# Patient Record
Sex: Female | Born: 1955
Health system: Southern US, Community
[De-identification: ages and names within clinical notes are randomized; demographics above are authoritative.]

## PROBLEM LIST (undated history)

## (undated) DIAGNOSIS — Z803 Family history of malignant neoplasm of breast: Secondary | ICD-10-CM

## (undated) DIAGNOSIS — E119 Type 2 diabetes mellitus without complications: Secondary | ICD-10-CM

## (undated) DIAGNOSIS — F32A Depression, unspecified: Secondary | ICD-10-CM

## (undated) DIAGNOSIS — Z8042 Family history of malignant neoplasm of prostate: Secondary | ICD-10-CM

## (undated) DIAGNOSIS — N3281 Overactive bladder: Secondary | ICD-10-CM

## (undated) HISTORY — DX: Family history of malignant neoplasm of prostate: Z80.42

## (undated) HISTORY — DX: Type 2 diabetes mellitus without complications: E11.9

## (undated) HISTORY — DX: Overactive bladder: N32.81

## (undated) HISTORY — DX: Depression, unspecified: F32.A

## (undated) HISTORY — PX: CHOLECYSTECTOMY: SHX55

## (undated) HISTORY — DX: Family history of malignant neoplasm of breast: Z80.3

---

## 2020-10-28 NOTE — Progress Notes (Signed)
Spoke to patient's daughter Minna Merritts regarding referral we received from Dr. Doyle Askew at [facility].  She states the patient will be staying in this area.  I have scheduled her to be seen by Dr. Burr Medico on 11/03/2020 at 3 pm to arrive no later than 2:45.  She was given our location.  She is aware she can only have one person to accompany her to this consult.

## 2020-11-02 NOTE — Progress Notes (Signed)
[place]   [phone] [phone] [place] Note   Patient Care Team: System, Provider Not In as PCP - General [name], MD (Psychiatry) Laretta Bolster, NP as Nurse Practitioner (Internal Medicine) Truitt Merle, MD as Consulting Physician (Oncology) Jonnie Finner, RN as Oncology Nurse Navigator  Date of Service:  11/03/2020  CHIEF COMPLAINTS/PURPOSE OF CONSULTATION:  Newly diagnosed Gall bladder cancer   REFERRING PHYSICIAN:  Dr. Olen Pel at [facility]   Oncology History  Primary gall bladder adenocarcinoma [facility])  09/20/2020 Cancer Staging   Staging form: Gallbladder, AJCC 8th Edition - Pathologic stage from 09/20/2020: Stage IIIB (pT3, pN1, cM0) - Signed by Truitt Merle, MD on 11/03/2020   11/03/2020 Initial Diagnosis   Primary gall bladder adenocarcinoma ([place])      HISTORY OF PRESENTING ILLNESS:  Pamela Kennedy 65 y.o. female is a here because of newly diagnosed gall bladder cancer. The patient was referred by Dr. Olen Pel at [facility]. The patient presents to the clinic today accompanied by her daughter Pamela Kennedy.   Pt initially presented to local ED for nausea, vomiting, abdominal pain and lethargy on 11/04/2019.  She was admitted to [facility] in [place], [place].  She was diagnosed with gallbladder stones and acute cholecystitis.  She was treated with antibiotics, and was taken to the OR for cholecystectomy.  Surgical path showed invasive adenocarcinoma. Staging CT was negative for distant metastasis. She was referred to [facility] surgeon Dr. Olen Pel for further management.  MRI of abdomen at [facility] on 08/17/2020 showed marked loculated collection within the gallbladder foci with subtle peripheral enhancement, prominent portacaval nodes terminate.  She was admitted to [facility] on September 20, 2020 for surgery. She underwent open segment IVb/V hepatectomy and complete total lymphadenectomy.  Surgical path showed residual adenocarcinoma in liver, and 2  out of 9 positive lymph nodes.  Surgical margins were negative.  Patient developed acute mental status change after surgery, work-up was done, and was felt that she had a small stroke.  She was discharged from [facility] on October 12, 2020, to rehab.  She did well in rehab, and was discharged on October 21, 2020.  She currently lives with her sister in [place], her daughter works in [employer].  Per her daughter Pamela Kennedy, she usually does not do well with anesthesia, with prolonged recovery. She is still slow, does not talk the way she used to be, HR high, no plapitation or chest pain , no pain, energy level is about 50% of her baseline.  She is able to do all her ADLs, does light housework, such as Medical sales representative.  Appetite still low, eats moderately well, has gained some weight back.   She fell out of bed this morning when she was reaching for something on nightstand, given mild bruises on her knees. She is able to walk without difficulty.   She has past medical history of severe depression with psychosis, she is under psychiatrist Dr. Olen Pel care.   REVIEW OF SYSTEMS:    Constitutional: Denies fevers, chills or abnormal night sweats, (+) fatigue and weight loss  Eyes: Denies blurriness of vision, double vision or watery eyes Ears, nose, mouth, throat, and face: Denies mucositis or sore throat Respiratory: Denies cough, dyspnea or wheezes Cardiovascular: Denies palpitation, chest discomfort or lower extremity swelling Gastrointestinal:  Denies nausea, heartburn or change in bowel habits, (+) low appetite  Skin: Denies abnormal skin rashes Lymphatics: Denies new lymphadenopathy or easy bruising Neurological:Denies numbness, tingling or new weaknesses Behavioral/Psych: Mood is  stable, no new changes All other systems were reviewed with the patient and are negative.   MEDICAL HISTORY:  Past Medical History:  Diagnosis Date  . Depression   . Diabetes mellitus without complication ([place])   . Overactive  bladder     SURGICAL HISTORY: Past Surgical History:  Procedure Laterality Date  . CHOLECYSTECTOMY      SOCIAL HISTORY: Social History   Socioeconomic History  . Marital status: Divorced    Spouse name: Not on file  . Number of children: 2  . Years of education: Not on file  . Highest education level: Not on file  Occupational History  . Occupation: retired, on disability since age of 64   Tobacco Use  . Smoking status: Current Some Day Smoker    Years: 60.00  . Smokeless tobacco: Current User    Types: Snuff  Substance and Sexual Activity  . Alcohol use: Never  . Drug use: Not on file  . Sexual activity: Not on file  Other Topics Concern  . Not on file  Social History Narrative  . Not on file   Social Determinants of Health   Financial Resource Strain: Not on file  Food Insecurity: Not on file  Transportation Needs: Not on file  Physical Activity: Not on file  Stress: Not on file  Social Connections: Not on file  Intimate Partner Violence: Not on file    FAMILY HISTORY: Family History  Problem Relation Age of Onset  . Cancer Father 60       prostate cancer   . Cancer Sister        breast cancer  . Cancer Brother 57       prostate cancer  . Cancer Sister 33       breast cancer    ALLERGIES:  has No Known Allergies.  MEDICATIONS:  Current Outpatient Medications  Medication Sig Dispense Refill  . aspirin EC 81 MG tablet Take 81 mg by mouth daily. Swallow whole.    . benztropine (COGENTIN) 0.5 MG tablet Take 0.5 mg by mouth 2 (two) times daily.    . fesoterodine (TOVIAZ) 8 MG TB24 tablet Take 8 mg by mouth daily.    Marland Kitchen lovastatin (ALTOPREV) 20 MG 24 hr tablet Take 20 mg by mouth at bedtime.    . metFORMIN (GLUCOPHAGE) 500 MG tablet Take by mouth 2 (two) times daily with a meal.    . raloxifene (EVISTA) 60 MG tablet Take 60 mg by mouth daily.    . risperidone (RISPERDAL) 4 MG tablet Take 4 mg by mouth daily. At bedtime    . venlafaxine (EFFEXOR) 75 MG  tablet Take 75 mg by mouth daily.    Marland Kitchen venlafaxine XR (EFFEXOR-XR) 150 MG 24 hr capsule Take 150 mg by mouth daily with breakfast.     No current facility-administered medications for this visit.    PHYSICAL EXAMINATION: ECOG PERFORMANCE STATUS: 2 - Symptomatic, <50% confined to bed  Vitals:   11/03/20 1449  BP: 117/79  Pulse: (!) 120  Resp: 15  Temp: 98.1 F (36.7 C)  SpO2: 100%   Filed Weights   11/03/20 1449  Weight: 193 lb 1.6 oz (87.6 kg)    GENERAL:alert, no distress and comfortable SKIN: skin color, texture, turgor are normal, no rashes or significant lesions EYES: normal, Conjunctiva are pink and non-injected, sclera clear NECK: supple, thyroid normal size, non-tender, without nodularity LYMPH:  no palpable lymphadenopathy in the cervical, axillary  LUNGS: clear to auscultation and percussion  with normal breathing effort HEART: regular rate & rhythm and no murmurs and no lower extremity edema ABDOMEN:abdomen soft, non-tender and normal bowel sounds, (+) surgical incision in RUQ healed very well.  Musculoskeletal:no cyanosis of digits and no clubbing  NEURO: alert & oriented x 3 with fluent speech, no focal motor/sensory deficits  LABORATORY DATA:  I have reviewed the data as listed  Surgical path at [facility] 09/20/20 Final Diagnosis   A: Lymph nodes, common and proper hepatic, lymphadenectomy: -One of seven lymph nodes, positive for adenocarcinoma. (1/7) -The lymph nodes show nonnecrotizing granulomatous reaction.  B: Lymph nodes, hepatic duodenal, lymphadenectomy: -One of two lymph nodes, positive for adenocarcinoma. (1/2)  C: Cystic duct, biopsy: -Bile duct, negative for carcinoma.  D: Liver, partial hepatectomy (gallbladder fossa): -Adenocarcinoma, poorly-differentiated, 1.0 cm in size,  infiltrating liver. -The cauterized liver parenchymal resection margin is negative for carcinoma.  (See comment)   CT Chest 08/25/20 at [facility] IMPRESSION:  No evidence  of intrathoracic metastatic disease.   Focal nodular asymmetries in the left breast requiring follow-up to exclude neoplastic etiology.   Recent cholecystectomy with slight inflammatory stranding at the gallbladder fossa.   Probable thyroid goiter with mild enlargement and hypodense lesions.   RADIOGRAPHIC STUDIES: I have personally reviewed the radiological images as listed and agreed with the findings in the report. No results found.  ASSESSMENT & PLAN:  Pamela Kennedy is a 65 y.o. female with a history of depression and DM  1. Gallbladder cancer cancer, pT3N1M0, stage IIIB  -I have reviewed her outside pathology initial cholecystectomy, and second surgery surgery at [facility].  She had stage IIIb gallbladder cancer, which was removed completely by 2 surgeries, with negative margins. -I reviewed the natural history of gallbladder, high risk of recurrence after surgery especially given her locally advanced disease. -Based on NCCN guideline, I recommend adjuvant Xeloda 1000mg /m2 bid day 1-14 every 21 days for 6 months --Chemotherapy consent: Side effects including but does not not limited to, fatigue, nausea, vomiting, diarrhea, hair loss, neuropathy, fluid retention, renal and kidney dysfunction, neutropenic fever, needed for blood transfusion, bleeding, were discussed with patient in great detail. She agrees to proceed. -the goal of therapy is curative  -She is recovering slowly from surgery, plan to start in about 2 weeks -We discussed cancer surveillance due to the high risk, Plan to surveillance CT scan every 6 months for the first few years then every year for a total of 3-4 years, with routine lab, exam 3 to 6 months for total of 5 years -All patient's questions answered she agrees to proceed.  2. Depression  -continue her meds and f/u with her psychiatrist   3. Low appetite and weight loss -will refer her to dietician   4. Genetics -Due to her strong family of breast cancer  and prostate cancer, I will refer her to genetics   PLAN:  -I will call in Xeloda 2000mg  Bid for day 1-14 every 21 days to [facility], plan to start on 2/7 -lab and f/u the week of 2/21  -dietician consult  -genetic referral  -lab next week   Orders Placed This Encounter  Procedures  . CBC with Differential (Cancer Center Only)    Standing Status:   Standing    Number of Occurrences:   20    Standing Expiration Date:   11/03/2021  . CMP ([place] only)    Standing Status:   Standing    Number of Occurrences:   20    Standing  Expiration Date:   11/03/2021  . CA 19.9    Standing Status:   Standing    Number of Occurrences:   10    Standing Expiration Date:   11/03/2021  . Ambulatory referral to Genetics    Referral Priority:   Routine    Referral Type:   Consultation    Referral Reason:   Specialty Services Required    Number of Visits Requested:   1  . Amb Referral to Nutrition and Diabetic Education    Referral Priority:   Routine    Referral Type:   Consultation    Referral Reason:   Specialty Services Required    Number of Visits Requested:   1    All questions were answered. The patient knows to call the clinic with any problems, questions or concerns. The total time spent in the appointment was 60 minutes.     Truitt Merle, MD 11/04/2020 11:09 AM  I, Joslyn Devon, am acting as scribe for Truitt Merle, MD.   I have reviewed the above documentation for accuracy and completeness, and I agree with the above.

## 2020-11-02 NOTE — Progress Notes (Signed)
Spoke with [facility] in [place], [place] and they do not have the capability of pushing images over to [employer].

## 2020-11-03 ENCOUNTER — Encounter: Payer: Self-pay | Admitting: Hematology

## 2020-11-03 ENCOUNTER — Inpatient Hospital Stay: Payer: Medicare Other | Attending: Hematology | Admitting: Hematology

## 2020-11-03 ENCOUNTER — Other Ambulatory Visit: Payer: Self-pay

## 2020-11-03 DIAGNOSIS — E119 Type 2 diabetes mellitus without complications: Secondary | ICD-10-CM | POA: Diagnosis not present

## 2020-11-03 DIAGNOSIS — Z7984 Long term (current) use of oral hypoglycemic drugs: Secondary | ICD-10-CM | POA: Insufficient documentation

## 2020-11-03 DIAGNOSIS — R630 Anorexia: Secondary | ICD-10-CM | POA: Insufficient documentation

## 2020-11-03 DIAGNOSIS — Z803 Family history of malignant neoplasm of breast: Secondary | ICD-10-CM | POA: Diagnosis not present

## 2020-11-03 DIAGNOSIS — F323 Major depressive disorder, single episode, severe with psychotic features: Secondary | ICD-10-CM | POA: Insufficient documentation

## 2020-11-03 DIAGNOSIS — Z8042 Family history of malignant neoplasm of prostate: Secondary | ICD-10-CM | POA: Insufficient documentation

## 2020-11-03 DIAGNOSIS — C23 Malignant neoplasm of gallbladder: Secondary | ICD-10-CM | POA: Diagnosis present

## 2020-11-03 DIAGNOSIS — R634 Abnormal weight loss: Secondary | ICD-10-CM | POA: Insufficient documentation

## 2020-11-03 DIAGNOSIS — Z7982 Long term (current) use of aspirin: Secondary | ICD-10-CM | POA: Diagnosis not present

## 2020-11-03 DIAGNOSIS — F17210 Nicotine dependence, cigarettes, uncomplicated: Secondary | ICD-10-CM

## 2020-11-03 DIAGNOSIS — Z79899 Other long term (current) drug therapy: Secondary | ICD-10-CM | POA: Insufficient documentation

## 2020-11-04 ENCOUNTER — Other Ambulatory Visit: Payer: Self-pay | Admitting: Hematology

## 2020-11-04 ENCOUNTER — Telehealth: Payer: Self-pay | Admitting: Hematology

## 2020-11-04 ENCOUNTER — Telehealth: Payer: Self-pay | Admitting: Pharmacist

## 2020-11-04 ENCOUNTER — Telehealth: Payer: Self-pay

## 2020-11-04 DIAGNOSIS — C23 Malignant neoplasm of gallbladder: Secondary | ICD-10-CM

## 2020-11-04 MED ORDER — CAPECITABINE 500 MG PO TABS: 1000.0000 mg/m2 | ORAL_TABLET | Freq: Two times a day (BID) | ORAL | 0 refills | Status: DC

## 2020-11-04 NOTE — Telephone Encounter (Signed)
Scheduled appointments per 1/27 los. Spoke to patient's daughter, Minna Merritts, who is aware of appointments dates and times.

## 2020-11-04 NOTE — Telephone Encounter (Signed)
Oral Oncology Patient Advocate Encounter  After completing a benefits investigation, prior authorization for Xeloda is not required at this time through Medicare B.  Patient's copay is $23.68  [facility] Phone [phone] Fax [phone] 11/04/2020 12:23 PM

## 2020-11-04 NOTE — Telephone Encounter (Signed)
Oral Oncology Pharmacist Encounter  Received new prescription for Xeloda (capecitabine) for the adjuvant treatment of gallbladder cancer, planned duration 6 months.  Prescription dose and frequency assessed for appropriateness. Planned therapy initiation 11/14/20.  Outside labs from 10/17/20 assessed, no dose adjustments required. Patient has baseline labs scheduled for 11/08/20 - will assess labs at that time.   Current medication list in Epic reviewed, no relevant/significant DDIs with Xeloda identified.  Evaluated chart and no patient barriers to medication adherence noted.   Prescription has been e-scribed to the [facility] for benefits analysis and approval.  Oral Oncology Clinic will continue to follow for insurance authorization, copayment issues, initial counseling and start date.  Leron Croak, PharmD, BCPS Hematology/Oncology Clinical Pharmacist [facility] [phone] 11/04/2020 12:32 PM

## 2020-11-08 ENCOUNTER — Inpatient Hospital Stay: Payer: Medicare Other

## 2020-11-08 ENCOUNTER — Other Ambulatory Visit: Payer: Self-pay | Admitting: Genetic Counselor

## 2020-11-08 ENCOUNTER — Encounter: Payer: Self-pay | Admitting: Genetic Counselor

## 2020-11-08 ENCOUNTER — Other Ambulatory Visit: Payer: Self-pay

## 2020-11-08 ENCOUNTER — Inpatient Hospital Stay: Payer: Medicare Other | Admitting: Nutrition

## 2020-11-08 ENCOUNTER — Inpatient Hospital Stay: Payer: Medicare Other | Attending: Hematology | Admitting: Genetic Counselor

## 2020-11-08 DIAGNOSIS — C23 Malignant neoplasm of gallbladder: Secondary | ICD-10-CM

## 2020-11-08 DIAGNOSIS — Z803 Family history of malignant neoplasm of breast: Secondary | ICD-10-CM | POA: Insufficient documentation

## 2020-11-08 DIAGNOSIS — Z8042 Family history of malignant neoplasm of prostate: Secondary | ICD-10-CM | POA: Insufficient documentation

## 2020-11-08 DIAGNOSIS — E119 Type 2 diabetes mellitus without complications: Secondary | ICD-10-CM | POA: Diagnosis not present

## 2020-11-08 DIAGNOSIS — Z79899 Other long term (current) drug therapy: Secondary | ICD-10-CM | POA: Insufficient documentation

## 2020-11-08 DIAGNOSIS — Z9049 Acquired absence of other specified parts of digestive tract: Secondary | ICD-10-CM | POA: Insufficient documentation

## 2020-11-08 DIAGNOSIS — D649 Anemia, unspecified: Secondary | ICD-10-CM | POA: Insufficient documentation

## 2020-11-08 DIAGNOSIS — F32A Depression, unspecified: Secondary | ICD-10-CM | POA: Diagnosis not present

## 2020-11-08 DIAGNOSIS — K5900 Constipation, unspecified: Secondary | ICD-10-CM | POA: Insufficient documentation

## 2020-11-08 DIAGNOSIS — Z7984 Long term (current) use of oral hypoglycemic drugs: Secondary | ICD-10-CM | POA: Insufficient documentation

## 2020-11-08 DIAGNOSIS — Z7982 Long term (current) use of aspirin: Secondary | ICD-10-CM | POA: Diagnosis not present

## 2020-11-08 LAB — CMP (CANCER CENTER ONLY)
ALT: 23 U/L (ref 0–44)
AST: 19 U/L (ref 15–41)
Albumin: 3.6 g/dL (ref 3.5–5.0)
Alkaline Phosphatase: 115 U/L (ref 38–126)
Anion gap: 11 (ref 5–15)
BUN: 11 mg/dL (ref 8–23)
CO2: 25 mmol/L (ref 22–32)
Calcium: 9.3 mg/dL (ref 8.9–10.3)
Chloride: 108 mmol/L (ref 98–111)
Creatinine: 0.84 mg/dL (ref 0.44–1.00)
GFR, Estimated: >60 mL/min (ref >60)
Glucose, Bld: 148 mg/dL — ABNORMAL HIGH (ref 70–99)
Potassium: 4.3 mmol/L (ref 3.5–5.1)
Sodium: 144 mmol/L (ref 135–145)
Total Bilirubin: 0.4 mg/dL (ref 0.3–1.2)
Total Protein: 7.5 g/dL (ref 6.5–8.1)

## 2020-11-08 LAB — CBC WITH DIFFERENTIAL (CANCER CENTER ONLY)
Abs Immature Granulocytes: 0.01 10*3/uL (ref 0.00–0.07)
Basophils Absolute: 0.1 10*3/uL (ref 0.0–0.1)
Basophils Relative: 1 %
Eosinophils Absolute: 0.1 10*3/uL (ref 0.0–0.5)
Eosinophils Relative: 2 %
HCT: 33.2 % — ABNORMAL LOW (ref 36.0–46.0)
Hemoglobin: 9.6 g/dL — ABNORMAL LOW (ref 12.0–15.0)
Immature Granulocytes: 0 %
Lymphocytes Relative: 21 %
Lymphs Abs: 1.5 10*3/uL (ref 0.7–4.0)
MCH: 19.5 pg — ABNORMAL LOW (ref 26.0–34.0)
MCHC: 28.9 g/dL — ABNORMAL LOW (ref 30.0–36.0)
MCV: 67.5 fL — ABNORMAL LOW (ref 80.0–100.0)
Monocytes Absolute: 0.4 10*3/uL (ref 0.1–1.0)
Monocytes Relative: 6 %
Neutro Abs: 4.9 10*3/uL (ref 1.7–7.7)
Neutrophils Relative %: 70 %
Platelet Count: 428 10*3/uL — ABNORMAL HIGH (ref 150–400)
RBC: 4.92 MIL/uL (ref 3.87–5.11)
RDW: 16 % — ABNORMAL HIGH (ref 11.5–15.5)
WBC Count: 7 10*3/uL (ref 4.0–10.5)
nRBC: 0 % (ref 0.0–0.2)

## 2020-11-08 LAB — GENETIC SCREENING ORDER

## 2020-11-08 MED FILL — CAPECITABINE 500 MG TABLET: 500 | 21 days supply | Qty: 112 | Fill #0

## 2020-11-08 NOTE — Progress Notes (Signed)
REFERRING PROVIDER: Truitt Merle, MD [place],  [place] [address]  PRIMARY PROVIDER:  System, Provider Not In  PRIMARY REASON FOR VISIT:  1. Primary gall bladder adenocarcinoma ([place])   2. Family history of breast cancer   3. Family history of prostate cancer      HISTORY OF PRESENT ILLNESS:   Pamela Kennedy, a 65 y.o. female, was seen for a  cancer genetics consultation at the request of Dr. Burr Medico due to a personal and family history of cancer.  Pamela Kennedy presents to clinic today to discuss the possibility of a hereditary predisposition to cancer, genetic testing, and to further clarify her future cancer risks, as well as potential cancer risks for family members.   In 2021, at the age of 39, Pamela Kennedy was diagnosed with adenocarcinoma of the gallbladder. The treatment plan includes chemotherapy.    CANCER HISTORY:  Oncology History Overview Note  Cancer Staging Primary gall bladder adenocarcinoma ([place]) Staging form: Gallbladder, AJCC 8th Edition - Pathologic stage from 09/20/2020: Stage IIIB (pT3, pN1, cM0) - Signed by Truitt Merle, MD on 11/03/2020 Histologic grade (G): G3 Histologic grading system: 3 grade system Residual tumor (R): R0 - None Histologic sub-type: Adenocarcinoma    Primary gall bladder adenocarcinoma ([place])  07/05/2020 Initial Biopsy   Cholecystectomy by Dr Moreen Fowler   Final Diagnosis A. Gallbladder, Cholecystectomy:  -Invasive adenocarcinoma, moderately to poorly-differentiated, infiltrating into the perimuscular fibroconnective tissue.  -Multifocal perineural invasion identified.  -The cystic duct margin is negative for carcinoma.  -Gallbladder with acute necrotizing cholecystitis and cholelithiasis.     08/17/2020 Imaging   MRI Abdomen at [facility]  IMPRESSION -Limited evaluation secondary to motion and use of motion insensitive sequences with suboptimal arterial phase timing.   -Multiloculated collection within the gallbladder  fossa with subtle peripheral enhancement may represent a postoperative seroma versus biloma. An abscess is thought to be unlikely.   -Prominent portacaval nodes, indeterminate and possibly reactive.   -Asymmetric left breast tissue likely fibroglandular tissue. Recommend  correlation with mammogram     08/25/2020 Imaging   CT Chest 08/25/20 at [facility] IMPRESSION:   No evidence of intrathoracic metastatic disease.   Focal nodular asymmetries in the left breast requiring follow-up to exclude neoplastic etiology.   Recent cholecystectomy with slight inflammatory stranding at the gallbladder fossa.   Probable thyroid goiter with mild enlargement and hypodense lesions.   09/20/2020 Cancer Staging   Staging form: Gallbladder, AJCC 8th Edition - Pathologic stage from 09/20/2020: Stage IIIB (pT3, pN1, cM0) - Signed by Truitt Merle, MD on 11/03/2020   09/20/2020 Surgery   Surgery of liver and abdomen by Dr Olen Pel at [facility]    09/20/2020 Pathology Results    Final Diagnosis   A: Lymph nodes, common and proper hepatic, lymphadenectomy: -One of seven lymph nodes, positive for adenocarcinoma. (1/7) -The lymph nodes show nonnecrotizing granulomatous reaction.   B: Lymph nodes, hepatic duodenal, lymphadenectomy: -One of two lymph nodes, positive for adenocarcinoma. (1/2)   C: Cystic duct, biopsy: -Bile duct, negative for carcinoma.   D: Liver, partial hepatectomy (gallbladder fossa): -Adenocarcinoma, poorly-differentiated, 1.0 cm in size,  infiltrating liver. -The cauterized liver parenchymal resection margin is negative for carcinoma.  (See comment)   11/03/2020 Initial Diagnosis   Primary gall bladder adenocarcinoma ([facility])     RISK FACTORS:  Menarche was at age unknown.  First live birth at age 89.  OCP use: yes, unknown number of years. Ovaries intact: yes.  Hysterectomy: no.  Menopausal status: postmenopausal.  HRT use: 0 years. Colonoscopy: yes; history of polyps. Mammogram within  the last year: yes. Any excessive radiation exposure in the past: no   Past Medical History:  Diagnosis Date  . Depression   . Diabetes mellitus without complication ([place])   . Family history of breast cancer   . Family history of prostate cancer   . Overactive bladder     Past Surgical History:  Procedure Laterality Date  . CHOLECYSTECTOMY      Social History   Socioeconomic History  . Marital status: Divorced    Spouse name: Not on file  . Number of children: 2  . Years of education: Not on file  . Highest education level: Not on file  Occupational History  . Occupation: retired, on disability since age of 64   Tobacco Use  . Smoking status: Current Some Day Smoker    Years: 60.00  . Smokeless tobacco: Current User    Types: Snuff  Substance and Sexual Activity  . Alcohol use: Never  . Drug use: Not on file  . Sexual activity: Not on file  Other Topics Concern  . Not on file  Social History Narrative  . Not on file   Social Determinants of Health   Financial Resource Strain: Not on file  Food Insecurity: Not on file  Transportation Needs: Not on file  Physical Activity: Not on file  Stress: Not on file  Social Connections: Not on file     FAMILY HISTORY:  We obtained a detailed, 4-generation family history.  Significant diagnoses are listed below: Family History  Problem Relation Age of Onset  . Prostate cancer Father 38       prostate cancer, metastatic  . Breast cancer Sister        breast cancer, dx unknown age  . Prostate cancer Brother 9       prostate cancer, dx 50s/60s  . Breast cancer Sister 14       breast cancer, dx unknown age  . Stroke Paternal Aunt    Pamela Kennedy has one daughter (age 64) and one son (age 74). She has one brother and six sisters. Her brother was diagnosed with prostate cancer in his 33s or 41s. Two of her sisters have been diagnosed with breast cancer (ages of diagnosis unknown).   Pamela Kennedy's mother died in her  41s without cancer. Pamela Kennedy had one maternal aunt and one maternal uncle. She does not know how old her maternal grandparents were when they died. There are no known diagnoses of cancer on the maternal side of the family.  Pamela Kennedy's father died at age 30 from metastatic prostate cancer. She had three paternal uncles and one paternal aunt. Her paternal grandmother died younger than 57 and it is unknown if she may have had cancer. Her paternal grandfather died at age 72 without cancer.  Pamela Kennedy is unaware of previous family history of genetic testing for hereditary cancer risks. Patient's ancestors are of unknown/African descent. There is no reported Ashkenazi Jewish ancestry. There is no known consanguinity.  GENETIC COUNSELING ASSESSMENT: Pamela Kennedy is a 65 y.o. female with a personal history of gallbladder cancer and a family history of breast and prostate cancer which is somewhat suggestive of a hereditary cancer syndrome and predisposition to cancer. We, therefore, discussed and recommended the following at today's visit.   DISCUSSION: We discussed that approximately 5-10% of cancer is hereditary. Most cases of gallbladder cancer are not associated with a hereditary cancer  syndrome, but rather occur sporadically or as a result of other risk factors. Breast and prostate cancer are more often associated with hereditary cancer syndromes. Most cases of hereditary breast and prostate cancer are associated with the BRCA1 and BRCA2 genes, although there are other genes that can be associated with hereditary breast and/or prostate cancer. These include ATM, CHEK2, PALB2, HOXB13, etc. We discussed that testing is beneficial for several reasons, including knowing about other cancer risks, identifying potential screening and risk-reduction options that may be appropriate, and to understand if other family members could be at risk for cancer and allow them to undergo genetic testing.   We  reviewed the characteristics, features and inheritance patterns of hereditary cancer syndromes. We also discussed genetic testing, including the appropriate family members to test, the process of testing, insurance coverage and turn-around-time for results. We discussed the implications of a negative, positive and/or variant of uncertain significant result. We recommended Pamela Kennedy pursue genetic testing for the [facility] Multi-Cancer + RNA panel.   The Multi-Cancer + RNA Panel offered by [facility] includes sequencing and/or deletion/duplication analysis of the following 84 genes:  AIP*, ALK, APC*, ATM*, AXIN2*, BAP1*, BARD1*, BLM*, BMPR1A*, BRCA1*, BRCA2*, BRIP1*, CASR, CDC73*, CDH1*, CDK4, CDKN1B*, CDKN1C*, CDKN2A, CEBPA, CHEK2*, CTNNA1*, DICER1*, DIS3L2*, EGFR, EPCAM, FH*, FLCN*, GATA2*, GPC3, GREM1, HOXB13, HRAS, KIT, MAX*, MEN1*, MET, MITF, MLH1*, MSH2*, MSH3*, MSH6*, MUTYH*, NBN*, NF1*, NF2*, NTHL1*, PALB2*, PDGFRA, PHOX2B, PMS2*, POLD1*, POLE*, POT1*, PRKAR1A*, PTCH1*, PTEN*, RAD50*, RAD51C*, RAD51D*, RB1*, RECQL4, RET, RUNX1*, SDHA*, SDHAF2*, SDHB*, SDHC*, SDHD*, SMAD4*, SMARCA4*, SMARCB1*, SMARCE1*, STK11*, SUFU*, TERC, TERT, TMEM127*, Tp53*, TSC1*, TSC2*, VHL*, WRN*, and WT1.  RNA analysis is performed for * genes.   Based on Pamela Kennedy's personal and family history of cancer, she meets medical criteria for genetic testing. Despite that she meets criteria, there may still be an out of pocket cost. We discussed that if her out of pocket cost for testing is over $100, the laboratory will reach out to let her know. If the out of pocket cost of testing is less than $100 she will be billed by the genetic testing laboratory.   PLAN: After considering the risks, benefits, and limitations, Pamela Kennedy provided informed consent to pursue genetic testing and the blood sample was sent to [facility] for analysis of the Multi-Cancer + RNA panel. Results should be available within approximately  two-three weeks' time, at which point they will be disclosed by telephone to Pamela Kennedy, as will any additional recommendations warranted by these results. Pamela Kennedy will receive a summary of her genetic counseling visit and a copy of her results once available. This information will also be available in Epic.   Pamela Kennedy questions were answered to her satisfaction today. Our contact information was provided should additional questions or concerns arise. Thank you for the referral and allowing [place] to share in the care of your patient.   Clint Guy, [place], [facility] Licensed, Certified Dispensing [email or web address] Phone: [phone]  The patient was seen for a total of 35 minutes in face-to-face genetic counseling.  This patient was discussed with Drs. Magrinat, Lindi Adie and/or Burr Medico who agrees with the above.    _______________________________________________________________________ For Office Staff:  Number of people involved in session: 1 Was an Intern/ student involved with case: no

## 2020-11-08 NOTE — Telephone Encounter (Signed)
Oral Chemotherapy Pharmacist Encounter  I spoke with patient and patient's daughter for overview of: Xeloda (capecitabine) for the adjuvant treatment of gallbladder cancer, planned duration 6 monhts.  Counseled on administration, dosing, side effects, monitoring, drug-food interactions, safe handling, storage, and disposal.  Patient will take Xeloda 500mg  tablets, 4 tablets (2000mg ) by mouth in AM and 4 tabs (2000mg ) by mouth in PM, within 30 minutes of finishing meals, for 14 days on, 7 days off, repeated every 21 days.  Xeloda start date: 11/14/20  Adverse effects include but are not limited to: fatigue, decreased blood counts, GI upset, diarrhea, mouth sores, and hand-foot syndrome.  Patient will obtain anti diarrheal and alert the office of 4 or more loose stools above baseline.  Reviewed with patient importance of keeping a medication schedule and plan for any missed doses. No barriers to medication adherence identified.  Medication reconciliation performed and medication/allergy list updated.  Patient's daughter will pick this up from the [facility] on 11/09/20.  Patient informed the pharmacy will reach out 5-7 days prior to needing next fill of Xeloda to coordinate continued medication acquisition to prevent break in therapy.  All questions answered.  Ms. Darr voiced understanding and appreciation.   Medication education handout placed in mail for patient. Patient knows to call the office with questions or concerns. Oral Chemotherapy Clinic phone number provided to patient.   Leron Croak, PharmD, BCPS Hematology/Oncology Clinical Pharmacist [facility] [phone] 11/08/2020 2:29 PM

## 2020-11-08 NOTE — Progress Notes (Signed)
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  She is followed by Dr. Burr Medico. Patient will receive Xeloda.  Past medical history includes depression with psychosis, diabetes, and stroke per daughter.  Medications include Glucophage.  Labs were reviewed.  Height: 5 feet 4 inches. Weight: 194.2 pounds. Usual body weight: 214 pounds per daughter. BMI: 33.33.  Patient presents to nutrition consult with her daughter. Noted negative margins achieved with surgery and plan is for oral chemotherapy. Patient is currently living with sisters.  They do a lot of the cooking. Patient reports she likes Glucerna.  She drinks mostly water.  She denies intolerance to any foods at this time. Weight is stable.  Nutrition diagnosis:  Food and nutrition related knowledge deficit related to gallbladder cancer as evidenced by no prior need for nutrition related information.  Intervention:  Patient educated to consume small frequent meals and snacks with adequate calories and protein for weight maintenance and continued healing. Brief education provided on nutrition strategies if patient develops diarrhea. Reviewed no concentrated sweets diet secondary to history of diabetes. Provided basic healthy diet information. I provided fact sheets.  Contact information given.  Questions were answered.  Teach back method used.  Monitoring, evaluation, goals: Patient will tolerate adequate calories and protein for weight maintenance and continued healing.  Next visit: No follow-up has been scheduled.  Patient has my contact information for questions or concerns.  **Disclaimer: This note was dictated with voice recognition software. Similar sounding words can inadvertently be transcribed and this note may contain transcription errors which may not have been corrected upon publication of note.**

## 2020-11-09 ENCOUNTER — Telehealth: Payer: Self-pay

## 2020-11-09 ENCOUNTER — Other Ambulatory Visit: Payer: Self-pay | Admitting: Hematology

## 2020-11-09 DIAGNOSIS — D649 Anemia, unspecified: Secondary | ICD-10-CM

## 2020-11-09 LAB — CANCER ANTIGEN 19-9: CA 19-9: <2 U/mL (ref 0–35)

## 2020-11-09 NOTE — Telephone Encounter (Signed)
-----   Message from Truitt Merle, MD sent at 11/09/2020  7:52 AM EST ----- Please let pt know her lab result, mild to moderate anemia, no other concerns.   Truitt Merle  11/09/2020

## 2020-11-09 NOTE — Telephone Encounter (Signed)
Message left to return call to receive most recent lab results   Per provider" mild to moderate anemia no other concerns"

## 2020-11-09 NOTE — Telephone Encounter (Signed)
Spoke with daughter relayed all lab results

## 2020-11-23 ENCOUNTER — Other Ambulatory Visit: Payer: Self-pay | Admitting: Hematology

## 2020-11-23 DIAGNOSIS — C23 Malignant neoplasm of gallbladder: Secondary | ICD-10-CM

## 2020-11-25 ENCOUNTER — Encounter: Payer: Self-pay | Admitting: Genetic Counselor

## 2020-11-25 ENCOUNTER — Telehealth: Payer: Self-pay | Admitting: Genetic Counselor

## 2020-11-25 ENCOUNTER — Ambulatory Visit: Payer: Self-pay | Admitting: Genetic Counselor

## 2020-11-25 DIAGNOSIS — Z1379 Encounter for other screening for genetic and chromosomal anomalies: Secondary | ICD-10-CM

## 2020-11-25 NOTE — Progress Notes (Signed)
[facility]   [phone] [phone]   Clinic Follow up Note   Patient Care Team: System, Provider Not In as PCP - General Ravinder, Mamedi, MD (Psychiatry) Mary Downey, NP as Nurse Practitioner (Internal Medicine) , , MD as Consulting Physician (Oncology) Anderson, Cheryl L, RN as Oncology Nurse Navigator  Date of Service:  11/28/2020  CHIEF COMPLAINT: F/u of Gall bladder cancer   SUMMARY OF ONCOLOGIC HISTORY: Oncology History Overview Note  Cancer Staging Primary gall bladder adenocarcinoma (HCC) Staging form: Gallbladder, AJCC 8th Edition - Pathologic stage from 09/20/2020: Stage IIIB (pT3, pN1, cM0) - Signed by , , MD on 11/03/2020 Histologic grade (G): G3 Histologic grading system: 3 grade system Residual tumor (R): R0 - None Histologic sub-type: Adenocarcinoma    Primary gall bladder adenocarcinoma (HCC)  07/05/2020 Initial Biopsy   Cholecystectomy by Dr Meghoo   Final Diagnosis A. Gallbladder, Cholecystectomy:  -Invasive adenocarcinoma, moderately to poorly-differentiated, infiltrating into the perimuscular fibroconnective tissue.  -Multifocal perineural invasion identified.  -The cystic duct margin is negative for carcinoma.  -Gallbladder with acute necrotizing cholecystitis and cholelithiasis.     08/17/2020 Imaging   MRI Abdomen at [facility]  IMPRESSION -Limited evaluation secondary to motion and use of motion insensitive sequences with suboptimal arterial phase timing.   -Multiloculated collection within the gallbladder fossa with subtle peripheral enhancement may represent a postoperative seroma versus biloma. An abscess is thought to be unlikely.   -Prominent portacaval nodes, indeterminate and possibly reactive.   -Asymmetric left breast tissue likely fibroglandular tissue. Recommend  correlation with mammogram     08/25/2020 Imaging   CT Chest 08/25/20 at [facility] IMPRESSION:   No evidence of intrathoracic  metastatic disease.   Focal nodular asymmetries in the left breast requiring follow-up to exclude neoplastic etiology.   Recent cholecystectomy with slight inflammatory stranding at the gallbladder fossa.   Probable thyroid goiter with mild enlargement and hypodense lesions.   09/20/2020 Cancer Staging   Staging form: Gallbladder, AJCC 8th Edition - Pathologic stage from 09/20/2020: Stage IIIB (pT3, pN1, cM0) - Signed by , , MD on 11/03/2020   09/20/2020 Surgery   Surgery of liver and abdomen by Dr Meyers at [facility]    09/20/2020 Pathology Results    Final Diagnosis   A: Lymph nodes, common and proper hepatic, lymphadenectomy: -One of seven lymph nodes, positive for adenocarcinoma. (1/7) -The lymph nodes show nonnecrotizing granulomatous reaction.   B: Lymph nodes, hepatic duodenal, lymphadenectomy: -One of two lymph nodes, positive for adenocarcinoma. (1/2)   C: Cystic duct, biopsy: -Bile duct, negative for carcinoma.   D: Liver, partial hepatectomy (gallbladder fossa): -Adenocarcinoma, poorly-differentiated, 1.0 cm in size,  infiltrating liver. -The cauterized liver parenchymal resection margin is negative for carcinoma.  (See comment)   11/03/2020 Initial Diagnosis   Primary gall bladder adenocarcinoma (HCC)   11/08/2020 Tumor Marker   CA 19-9 - less than 2   11/14/2020 -  Chemotherapy   Xeloda 2000mg Bid for day 1-14 every 21 days starting on 11/14/20    11/25/2020 Genetic Testing   Negative genetic testing:  No pathogenic variants detected on the [facility] Multi-Cancer + RNA panel. The report date is 11/25/2020.   The Multi-Cancer + RNA Panel offered by [facility] includes sequencing and/or deletion/duplication analysis of the following 84 genes:  AIP*, ALK, APC*, ATM*, AXIN2*, BAP1*, BARD1*, BLM*, BMPR1A*, BRCA1*, BRCA2*, BRIP1*, CASR, CDC73*, CDH1*, CDK4, CDKN1B*, CDKN1C*, CDKN2A, CEBPA, CHEK2*, CTNNA1*, DICER1*, DIS3L2*, EGFR, EPCAM, FH*, FLCN*, GATA2*, GPC3, GREM1, HOXB13,  HRAS,   KIT, MAX*, MEN1*, MET, MITF, MLH1*, MSH2*, MSH3*, MSH6*, MUTYH*, NBN*, NF1*, NF2*, NTHL1*, PALB2*, PDGFRA, PHOX2B, PMS2*, POLD1*, POLE*, POT1*, PRKAR1A*, PTCH1*, PTEN*, RAD50*, RAD51C*, RAD51D*, RB1*, RECQL4, RET, RUNX1*, SDHA*, SDHAF2*, SDHB*, SDHC*, SDHD*, SMAD4*, SMARCA4*, SMARCB1*, SMARCE1*, STK11*, SUFU*, TERC, TERT, TMEM127*, Tp53*, TSC1*, TSC2*, VHL*, WRN*, and WT1.  RNA analysis is performed for * genes.       CURRENT THERAPY:  Xeloda 2000mg Bid for day 1-14 every 21 days starting on 11/14/20  INTERVAL HISTORY:  Pamela Kennedy is here for a follow up. She presents to the clinic with her daughter. She notes she is tolerating Xeloda well with no significant side effects. She denies further fatigue. Her daughter notes she is stable since recovering mostly from surgery. She has been doing PT at home once a week. That would be the only time she exercise. She is mostly sitting down. She is not doing much housework given her family is there to do it. Her daughter notes her mother complained of constipation and stomach cramps. She recently did not have BM for 1 week. She tried fleet enema. This constipation started her week 2. She notes since she had BM she has been able to continue BM daily.     REVIEW OF SYSTEMS:   Constitutional: Denies fevers, chills or abnormal weight loss Eyes: Denies blurriness of vision Ears, nose, mouth, throat, and face: Denies mucositis or sore throat Respiratory: Denies cough, dyspnea or wheezes Cardiovascular: Denies palpitation, chest discomfort or lower extremity swelling Gastrointestinal:  Denies nausea, heartburn  Skin: Denies abnormal skin rashes Lymphatics: Denies new lymphadenopathy or easy bruising Neurological:Denies numbness, tingling or new weaknesses Behavioral/Psych: Mood is stable, no new changes  All other systems were reviewed with the patient and are negative.  MEDICAL HISTORY:  Past Medical History:  Diagnosis Date  . Depression    . Diabetes mellitus without complication (HCC)   . Family history of breast cancer   . Family history of prostate cancer   . Overactive bladder     SURGICAL HISTORY: Past Surgical History:  Procedure Laterality Date  . CHOLECYSTECTOMY      I have reviewed the social history and family history with the patient and they are unchanged from previous note.  ALLERGIES:  has No Known Allergies.  MEDICATIONS:  Current Outpatient Medications  Medication Sig Dispense Refill  . aspirin EC 81 MG tablet Take 81 mg by mouth daily. Swallow whole.    . benztropine (COGENTIN) 0.5 MG tablet Take 0.5 mg by mouth 2 (two) times daily.    . capecitabine (XELODA) 500 MG tablet Take 4 tablets (2,000 mg total) by mouth 2 (two) times daily after a meal. Take for 14 days on, then 7 days off, repeat every 21 days. Plan to start on 11/14/2020 112 tablet 1  . fesoterodine (TOVIAZ) 8 MG TB24 tablet Take 8 mg by mouth daily.    . lovastatin (ALTOPREV) 20 MG 24 hr tablet Take 20 mg by mouth at bedtime.    . metFORMIN (GLUCOPHAGE) 500 MG tablet Take by mouth 2 (two) times daily with a meal.    . raloxifene (EVISTA) 60 MG tablet Take 60 mg by mouth daily.    . risperidone (RISPERDAL) 4 MG tablet Take 4 mg by mouth daily. At bedtime    . venlafaxine (EFFEXOR) 75 MG tablet Take 75 mg by mouth daily.    . venlafaxine XR (EFFEXOR-XR) 150 MG 24 hr capsule Take 150 mg by mouth daily with breakfast.       No current facility-administered medications for this visit.    PHYSICAL EXAMINATION: ECOG PERFORMANCE STATUS: 2 - Symptomatic, <50% confined to bed  Vitals:   11/28/20 1107  BP: 106/80  Pulse: (!) 112  Resp: 16  Temp: 98.8 F (37.1 C)  SpO2: 100%   Filed Weights   11/28/20 1107  Weight: 195 lb 1.6 oz (88.5 kg)    Due to COVID19 we will limit examination to appearance. Patient had no complaints.  GENERAL:alert, no distress and comfortable SKIN: skin color normal, no rashes or significant lesions (+) Mild  skin darkening of palms EYES: normal, Conjunctiva are pink and non-injected, sclera clear  NEURO: alert & oriented x 3 with fluent speech  LABORATORY DATA:  I have reviewed the data as listed CBC Latest Ref Rng & Units 11/28/2020 11/08/2020  WBC 4.0 - 10.5 K/uL 5.1 7.0  Hemoglobin 12.0 - 15.0 g/dL 9.5(L) 9.6(L)  Hematocrit 36.0 - 46.0 % 32.1(L) 33.2(L)  Platelets 150 - 400 K/uL 278 428(H)     CMP Latest Ref Rng & Units 11/28/2020 11/08/2020  Glucose 70 - 99 mg/dL 230(H) 148(H)  BUN 8 - 23 mg/dL 12 11  Creatinine 0.44 - 1.00 mg/dL 0.94 0.84  Sodium 135 - 145 mmol/L 139 144  Potassium 3.5 - 5.1 mmol/L 3.6 4.3  Chloride 98 - 111 mmol/L 107 108  CO2 22 - 32 mmol/L 21(L) 25  Calcium 8.9 - 10.3 mg/dL 8.9 9.3  Total Protein 6.5 - 8.1 g/dL 6.9 7.5  Total Bilirubin 0.3 - 1.2 mg/dL 0.4 0.4  Alkaline Phos 38 - 126 U/L 109 115  AST 15 - 41 U/L 7(L) 19  ALT 0 - 44 U/L 6 23      RADIOGRAPHIC STUDIES: I have personally reviewed the radiological images as listed and agreed with the findings in the report. No results found.   ASSESSMENT & PLAN:  Pamela Kennedy is a 65 y.o. female with   1. Gallbladder cancer cancer, pT3N1M0, stage IIIB  -She had stage IIIb gallbladder cancer diagnosed in 06/2020, which was removed completely by 2 surgeries, with negative margins. Last surgery in 09/2020.  -I started her on adjuvant Xeloda 1028m/m2 bid day 1-14 every 21 days for 6 months on  11/14/20 -She has tolerated first cycle Xeloda well. She did have 1 week of significant constipation. I reviewed constipation management with her.  -Labs reviewed, CBC and CMP except Hg 9.5, BG 230. Will proceed with C2 Xeloda at same dose on 12/05/20.  -We discussed if she experience more side effects, we may reduce her xeloda dose  -F/u in 3 weeks.    2. Depression  -Continue her meds and f/u with her psychiatrist.   3. Low appetite and weight loss -will refer her to dietician  -Her weight is trending up.    4. Genetics -She has been seen by genetic counselor on 11/08/20. Genetic testing results still pending.  5. Anemia  -Her labs show moderate anemia with low MCV, MCH. Platelet was mildly elevated in 11/2020.  -Thalassemia work up is still pending from 11/28/20.   6. DM, Hyperglycemia  -She is on metformin for her DM.  -After only Coffee this morning, her BG is 230. I discussed this is high and should be monitored closely.    PLAN:  -continue Xeloda 2003mBID, start C2 on 12/05/20 -Lab and f/u in 3 weeks.   No problem-specific Assessment & Plan notes found for this encounter.   No orders of the defined types were placed  in this encounter.  All questions were answered. The patient knows to call the clinic with any problems, questions or concerns. No barriers to learning was detected. The total time spent in the appointment was 30 minutes.      , MD 11/28/2020   I, Amoya Bennett, am acting as scribe for  , MD.   I have reviewed the above documentation for accuracy and completeness, and I agree with the above.       

## 2020-11-25 NOTE — Progress Notes (Signed)
HPI:  Ms. Kueker was previously seen in the [place] clinic due to a personal and family history of cancer and concerns regarding a hereditary predisposition to cancer. Please refer to our prior cancer genetics clinic note for more information regarding our discussion, assessment and recommendations, at the time. Ms. Lueth recent genetic test results were disclosed to her, as were recommendations warranted by these results. These results and recommendations are discussed in more detail below.  FAMILY HISTORY:  We obtained a detailed, 4-generation family history.  Significant diagnoses are listed below: Family History  Problem Relation Age of Onset  . Prostate cancer Father 19       prostate cancer, metastatic  . Breast cancer Sister        breast cancer, dx unknown age  . Prostate cancer Brother 11       prostate cancer, dx 50s/60s  . Breast cancer Sister 25       breast cancer, dx unknown age  . Stroke Paternal Aunt    Ms. Blakney has one daughter (age 73) and one son (age 58). She has one brother and six sisters. Her brother was diagnosed with prostate cancer in his 52s or 91s. Two of her sisters have been diagnosed with breast cancer (ages of diagnosis unknown).   Ms. Matkins's mother died in her 22s without cancer. Ms. Spoonemore had one maternal aunt and one maternal uncle. She does not know how old her maternal grandparents were when they died. There are no known diagnoses of cancer on the maternal side of the family.  Ms. Camuso's father died at age 40 from metastatic prostate cancer. She had three paternal uncles and one paternal aunt. Her paternal grandmother died younger than 13 and it is unknown if she may have had cancer. Her paternal grandfather died at age 40 without cancer.  Ms. Dunlevy is unaware of previous family history of genetic testing for hereditary cancer risks. Patient's ancestors are of unknown/African descent. There is no  reported Ashkenazi Jewish ancestry. There is no known consanguinity.  GENETIC TEST RESULTS: Genetic testing reported out on 11/25/2020 through the [facility] Multi-Cancer + RNA panel. No pathogenic variants were detected.   The Multi-Cancer + RNA Panel offered by [facility] includes sequencing and/or deletion/duplication analysis of the following 84 genes:  AIP*, ALK, APC*, ATM*, AXIN2*, BAP1*, BARD1*, BLM*, BMPR1A*, BRCA1*, BRCA2*, BRIP1*, CASR, CDC73*, CDH1*, CDK4, CDKN1B*, CDKN1C*, CDKN2A, CEBPA, CHEK2*, CTNNA1*, DICER1*, DIS3L2*, EGFR, EPCAM, FH*, FLCN*, GATA2*, GPC3, GREM1, HOXB13, HRAS, KIT, MAX*, MEN1*, MET, MITF, MLH1*, MSH2*, MSH3*, MSH6*, MUTYH*, NBN*, NF1*, NF2*, NTHL1*, PALB2*, PDGFRA, PHOX2B, PMS2*, POLD1*, POLE*, POT1*, PRKAR1A*, PTCH1*, PTEN*, RAD50*, RAD51C*, RAD51D*, RB1*, RECQL4, RET, RUNX1*, SDHA*, SDHAF2*, SDHB*, SDHC*, SDHD*, SMAD4*, SMARCA4*, SMARCB1*, SMARCE1*, STK11*, SUFU*, TERC, TERT, TMEM127*, Tp53*, TSC1*, TSC2*, VHL*, WRN*, and WT1.  RNA analysis is performed for * genes. The test report will be scanned into EPIC and located under the Molecular Pathology section of the Results Review tab.  A portion of the result report is included below for reference.     We discussed with Ms. Vasil that because current genetic testing is not perfect, it is possible there may be a gene mutation in one of these genes that current testing cannot detect, but that chance is small.  We also discussed that there could be another gene that has not yet been discovered, or that we have not yet tested, that is responsible for the cancer diagnoses in the family. It is also possible there is  a hereditary cause for the cancer in the family that Ms. Brys did not inherit and therefore was not identified in her testing.  Therefore, it is important to remain in touch with cancer genetics in the future so that we can continue to offer Ms. Delapena the most up to date genetic testing.   CANCER SCREENING  RECOMMENDATIONS: Ms. Manukyan test result is considered negative (normal).  This means that we have not identified a hereditary cause for her personal and family history of cancer at this time. While reassuring, this does not definitively rule out a hereditary predisposition to cancer. It is still possible that there could be genetic mutations that are undetectable by current technology. There could be genetic mutations in genes that have not been tested or identified to increase cancer risk.  Therefore, it is recommended she continue to follow the cancer management and screening guidelines provided by her oncology and primary healthcare provider.   An individual's cancer risk and medical management are not determined by genetic test results alone. Overall cancer risk assessment incorporates additional factors, including personal medical history, family history, and any available genetic information that may result in a personalized plan for cancer prevention and surveillance.  Based on Ms. Lamar's personal and family history, as well as her genetic test results, a statistical model Midwife) was used to estimate her risk of developing breast cancer. Tyrer-Cuzick estimates her lifetime risk of developing breast cancer to be approximately 21.1%. This lifetime breast cancer risk is a preliminary estimate based on available information using one of several models endorsed by the [employer] (ACS). The ACS recommends consideration of breast MRI screening as an adjunct to mammography for patients at high risk (defined as 20% or greater lifetime risk). A more detailed breast cancer risk assessment can be considered, if clinically indicated.  Ms. Rothenberger has been determined to be at high risk for breast cancer. Therefore, we recommend that she consider annual breast MRIs in addition to annual mammograms. We discussed that Ms. Sealey should discuss her individual situation with her  referring physician and determine a breast cancer screening plan with which they are both comfortable.      RECOMMENDATIONS FOR FAMILY MEMBERS:  Individuals in this family might be at some increased risk of developing cancer, over the general population risk, simply due to the family history of cancer.  We recommended women in this family have a yearly mammogram beginning at age 80, or 66 years younger than the earliest onset of cancer, an annual clinical breast exam, and perform monthly breast self-exams. Women in this family should also have a gynecological exam as recommended by their primary provider. All family members should be referred for colonoscopy starting at age 86.  It is also possible there is a hereditary cause for the cancer in Ms. Szeliga's family that she did not inherit and therefore was not identified in her.  Based on Ms. Dace's family history, we recommended her siblings have genetic counseling and testing. Ms. Vanderkolk will let us know if we can be of any assistance in coordinating genetic counseling and/or testing for this family member.   FOLLOW-UP: Lastly, we discussed that cancer genetics is a rapidly advancing field and it is possible that new genetic tests will be appropriate for her and/or her family members in the future. We encouraged her to remain in contact with cancer genetics on an annual basis so we can update her personal and family histories and let her know of advances in  cancer genetics that may benefit this family.   Our contact number was provided. Ms. Funderburke questions were answered to her satisfaction, and she knows she is welcome to call us at anytime with additional questions or concerns.   Clint Guy, MS, [facility] Genetic Counselor [email or web address] Phone: [phone]

## 2020-11-25 NOTE — Telephone Encounter (Signed)
Spoke with Pamela Kennedy's daughter and revealed negative genetic testing. Discussed that we do not know why Pamela Kennedy has cancer or why there is cancer in the family. There could be a genetic mutation in the family that Pamela Kennedy did not inherit. There could also be a mutation in a different gene that we are not testing, or our current technology may not be able to detect certain mutations. It will therefore be important for her to stay in contact with genetics to keep up with whether additional testing may be appropriate in the future.

## 2020-11-28 ENCOUNTER — Encounter: Payer: Self-pay | Admitting: Hematology

## 2020-11-28 ENCOUNTER — Inpatient Hospital Stay: Payer: Medicare Other

## 2020-11-28 ENCOUNTER — Other Ambulatory Visit: Payer: Self-pay

## 2020-11-28 ENCOUNTER — Inpatient Hospital Stay (HOSPITAL_BASED_OUTPATIENT_CLINIC_OR_DEPARTMENT_OTHER): Payer: Medicare Other | Admitting: Hematology

## 2020-11-28 ENCOUNTER — Other Ambulatory Visit: Payer: Self-pay | Admitting: Hematology

## 2020-11-28 VITALS — BP 106/80 | HR 112 | Temp 98.8°F | Resp 16 | Ht 64.0 in | Wt 195.1 lb

## 2020-11-28 DIAGNOSIS — C23 Malignant neoplasm of gallbladder: Secondary | ICD-10-CM

## 2020-11-28 DIAGNOSIS — D649 Anemia, unspecified: Secondary | ICD-10-CM

## 2020-11-28 LAB — CBC WITH DIFFERENTIAL (CANCER CENTER ONLY)
Abs Immature Granulocytes: 0.04 10*3/uL (ref 0.00–0.07)
Basophils Absolute: 0 10*3/uL (ref 0.0–0.1)
Basophils Relative: 0 %
Eosinophils Absolute: 0.1 10*3/uL (ref 0.0–0.5)
Eosinophils Relative: 2 %
HCT: 32.1 % — ABNORMAL LOW (ref 36.0–46.0)
Hemoglobin: 9.5 g/dL — ABNORMAL LOW (ref 12.0–15.0)
Immature Granulocytes: 1 %
Lymphocytes Relative: 25 %
Lymphs Abs: 1.3 10*3/uL (ref 0.7–4.0)
MCH: 19.5 pg — ABNORMAL LOW (ref 26.0–34.0)
MCHC: 29.6 g/dL — ABNORMAL LOW (ref 30.0–36.0)
MCV: 65.9 fL — ABNORMAL LOW (ref 80.0–100.0)
Monocytes Absolute: 0.2 10*3/uL (ref 0.1–1.0)
Monocytes Relative: 4 %
Neutro Abs: 3.5 10*3/uL (ref 1.7–7.7)
Neutrophils Relative %: 68 %
Platelet Count: 278 10*3/uL (ref 150–400)
RBC: 4.87 MIL/uL (ref 3.87–5.11)
RDW: 17.3 % — ABNORMAL HIGH (ref 11.5–15.5)
WBC Count: 5.1 10*3/uL (ref 4.0–10.5)
nRBC: 0 % (ref 0.0–0.2)

## 2020-11-28 LAB — IRON AND TIBC
Iron: 82 ug/dL (ref 41–142)
Saturation Ratios: 21 % (ref 21–57)
TIBC: 396 ug/dL (ref 236–444)
UIBC: 314 ug/dL (ref 120–384)

## 2020-11-28 LAB — CMP (CANCER CENTER ONLY)
ALT: 6 U/L (ref 0–44)
AST: 7 U/L — ABNORMAL LOW (ref 15–41)
Albumin: 3.6 g/dL (ref 3.5–5.0)
Alkaline Phosphatase: 109 U/L (ref 38–126)
Anion gap: 11 (ref 5–15)
BUN: 12 mg/dL (ref 8–23)
CO2: 21 mmol/L — ABNORMAL LOW (ref 22–32)
Calcium: 8.9 mg/dL (ref 8.9–10.3)
Chloride: 107 mmol/L (ref 98–111)
Creatinine: 0.94 mg/dL (ref 0.44–1.00)
GFR, Estimated: >60 mL/min (ref >60)
Glucose, Bld: 230 mg/dL — ABNORMAL HIGH (ref 70–99)
Potassium: 3.6 mmol/L (ref 3.5–5.1)
Sodium: 139 mmol/L (ref 135–145)
Total Bilirubin: 0.4 mg/dL (ref 0.3–1.2)
Total Protein: 6.9 g/dL (ref 6.5–8.1)

## 2020-11-28 LAB — FERRITIN: Ferritin: 39 ng/mL (ref 11–307)

## 2020-11-28 MED ORDER — CAPECITABINE 500 MG PO TABS: 1000.0000 mg/m2 | ORAL_TABLET | Freq: Two times a day (BID) | ORAL | 1 refills | Status: DC

## 2020-11-29 ENCOUNTER — Telehealth: Payer: Self-pay | Admitting: Hematology

## 2020-11-29 MED FILL — CAPECITABINE 500 MG TABLET: 500 | 21 days supply | Qty: 112 | Fill #0

## 2020-11-29 NOTE — Telephone Encounter (Signed)
Scheduled follow-up appointment per 2/21 los. Patient's daughter is aware.

## 2020-11-30 LAB — HGB FRACTIONATION CASCADE
Hgb A2: 2.1 % (ref 1.8–3.2)
Hgb A: 97.9 % (ref 96.4–98.8)
Hgb F: 0 % (ref 0.0–2.0)
Hgb S: 0 %

## 2020-12-16 NOTE — Progress Notes (Incomplete)
[facility]   [phone] [phone]   Clinic Follow up Note   Patient Care Team: System, Provider Not In as PCP - General [name], MD (Psychiatry) Laretta Bolster, NP as Nurse Practitioner (Internal Medicine) Truitt Merle, MD as Consulting Physician (Oncology) Jonnie Finner, RN as Oncology Nurse Navigator  Date of Service:  12/16/2020  CHIEF COMPLAINT: F/u of Gall bladder cancer  SUMMARY OF ONCOLOGIC HISTORY: Oncology History Overview Note  Cancer Staging Primary gall bladder adenocarcinoma [facility]) Staging form: Gallbladder, AJCC 8th Edition - Pathologic stage from 09/20/2020: Stage IIIB (pT3, pN1, cM0) - Signed by Truitt Merle, MD on 11/03/2020 Histologic grade (G): G3 Histologic grading system: 3 grade system Residual tumor (R): R0 - None Histologic sub-type: Adenocarcinoma    Primary gall bladder adenocarcinoma ([place])  07/05/2020 Initial Biopsy   Cholecystectomy by Dr Moreen Fowler   Final Diagnosis A. Gallbladder, Cholecystectomy:  -Invasive adenocarcinoma, moderately to poorly-differentiated, infiltrating into the perimuscular fibroconnective tissue.  -Multifocal perineural invasion identified.  -The cystic duct margin is negative for carcinoma.  -Gallbladder with acute necrotizing cholecystitis and cholelithiasis.     08/17/2020 Imaging   MRI Abdomen at [facility]  IMPRESSION -Limited evaluation secondary to motion and use of motion insensitive sequences with suboptimal arterial phase timing.   -Multiloculated collection within the gallbladder fossa with subtle peripheral enhancement may represent a postoperative seroma versus biloma. An abscess is thought to be unlikely.   -Prominent portacaval nodes, indeterminate and possibly reactive.   -Asymmetric left breast tissue likely fibroglandular tissue. Recommend  correlation with mammogram     08/25/2020 Imaging   CT Chest 08/25/20 at [facility] IMPRESSION:   No evidence of intrathoracic  metastatic disease.   Focal nodular asymmetries in the left breast requiring follow-up to exclude neoplastic etiology.   Recent cholecystectomy with slight inflammatory stranding at the gallbladder fossa.   Probable thyroid goiter with mild enlargement and hypodense lesions.   09/20/2020 Cancer Staging   Staging form: Gallbladder, AJCC 8th Edition - Pathologic stage from 09/20/2020: Stage IIIB (pT3, pN1, cM0) - Signed by Truitt Merle, MD on 11/03/2020   09/20/2020 Surgery   Surgery of liver and abdomen by Dr Olen Pel at [facility]    09/20/2020 Pathology Results    Final Diagnosis   A: Lymph nodes, common and proper hepatic, lymphadenectomy: -One of seven lymph nodes, positive for adenocarcinoma. (1/7) -The lymph nodes show nonnecrotizing granulomatous reaction.   B: Lymph nodes, hepatic duodenal, lymphadenectomy: -One of two lymph nodes, positive for adenocarcinoma. (1/2)   C: Cystic duct, biopsy: -Bile duct, negative for carcinoma.   D: Liver, partial hepatectomy (gallbladder fossa): -Adenocarcinoma, poorly-differentiated, 1.0 cm in size,  infiltrating liver. -The cauterized liver parenchymal resection margin is negative for carcinoma.  (See comment)   11/03/2020 Initial Diagnosis   Primary gall bladder adenocarcinoma ([place])   11/08/2020 Tumor Marker   CA 19-9 - less than 2   11/14/2020 -  Chemotherapy   Xeloda 2062m Bid for day 1-14 every 21 days starting on 11/14/20    11/25/2020 Genetic Testing   Negative genetic testing:  No pathogenic variants detected on the [facility] Multi-Cancer + RNA panel. The report date is 11/25/2020.   The Multi-Cancer + RNA Panel offered by [facility] includes sequencing and/or deletion/duplication analysis of the following 84 genes:  AIP*, ALK, APC*, ATM*, AXIN2*, BAP1*, BARD1*, BLM*, BMPR1A*, BRCA1*, BRCA2*, BRIP1*, CASR, CDC73*, CDH1*, CDK4, CDKN1B*, CDKN1C*, CDKN2A, CEBPA, CHEK2*, CTNNA1*, DICER1*, DIS3L2*, EGFR, EPCAM, FH*, FLCN*, GATA2*, GPC3, GREM1, HOXB13,  HRAS, KIT,  MAX*, MEN1*, MET, MITF, MLH1*, MSH2*, MSH3*, MSH6*, MUTYH*, NBN*, NF1*, NF2*, NTHL1*, PALB2*, PDGFRA, PHOX2B, PMS2*, POLD1*, POLE*, POT1*, PRKAR1A*, PTCH1*, PTEN*, RAD50*, RAD51C*, RAD51D*, RB1*, RECQL4, RET, RUNX1*, SDHA*, SDHAF2*, SDHB*, SDHC*, SDHD*, SMAD4*, SMARCA4*, SMARCB1*, SMARCE1*, STK11*, SUFU*, TERC, TERT, TMEM127*, Tp53*, TSC1*, TSC2*, VHL*, WRN*, and WT1.  RNA analysis is performed for * genes.       CURRENT THERAPY:  Xeloda 2031m Bid for day 1-14 every 21 days starting on 11/14/20  INTERVAL HISTORY: *** Pamela Kennedy is here for a follow up. She presents to the clinic alone.    REVIEW OF SYSTEMS:  *** Constitutional: Denies fevers, chills or abnormal weight loss Eyes: Denies blurriness of vision Ears, nose, mouth, throat, and face: Denies mucositis or sore throat Respiratory: Denies cough, dyspnea or wheezes Cardiovascular: Denies palpitation, chest discomfort or lower extremity swelling Gastrointestinal:  Denies nausea, heartburn or change in bowel habits Skin: Denies abnormal skin rashes Lymphatics: Denies new lymphadenopathy or easy bruising Neurological:Denies numbness, tingling or new weaknesses Behavioral/Psych: Mood is stable, no new changes  All other systems were reviewed with the patient and are negative.  MEDICAL HISTORY:  Past Medical History:  Diagnosis Date  . Depression   . Diabetes mellitus without complication ([place]   . Family history of breast cancer   . Family history of prostate cancer   . Overactive bladder     SURGICAL HISTORY: Past Surgical History:  Procedure Laterality Date  . CHOLECYSTECTOMY      I have reviewed the social history and family history with the patient and they are unchanged from previous note.  ALLERGIES:  has No Known Allergies.  MEDICATIONS:  Current Outpatient Medications  Medication Sig Dispense Refill  . aspirin EC 81 MG tablet Take 81 mg by mouth daily. Swallow whole.    . benztropine  (COGENTIN) 0.5 MG tablet Take 0.5 mg by mouth 2 (two) times daily.    . capecitabine (XELODA) 500 MG tablet Take 4 tablets (2,000 mg total) by mouth 2 (two) times daily after a meal. Take for 14 days on, then 7 days off, repeat every 21 days. Plan to start on 11/14/2020 112 tablet 1  . fesoterodine (TOVIAZ) 8 MG TB24 tablet Take 8 mg by mouth daily.    .Marland Kitchenlovastatin (ALTOPREV) 20 MG 24 hr tablet Take 20 mg by mouth at bedtime.    . metFORMIN (GLUCOPHAGE) 500 MG tablet Take by mouth 2 (two) times daily with a meal.    . raloxifene (EVISTA) 60 MG tablet Take 60 mg by mouth daily.    . risperidone (RISPERDAL) 4 MG tablet Take 4 mg by mouth daily. At bedtime    . venlafaxine (EFFEXOR) 75 MG tablet Take 75 mg by mouth daily.    .Marland Kitchenvenlafaxine XR (EFFEXOR-XR) 150 MG 24 hr capsule Take 150 mg by mouth daily with breakfast.     No current facility-administered medications for this visit.    PHYSICAL EXAMINATION: ECOG PERFORMANCE STATUS: {CHL ONC ECOG PS:[id number]}  There were no vitals filed for this visit. There were no vitals filed for this visit. *** GENERAL:alert, no distress and comfortable SKIN: skin color, texture, turgor are normal, no rashes or significant lesions EYES: normal, Conjunctiva are pink and non-injected, sclera clear {OROPHARYNX:no exudate, no erythema and lips, buccal mucosa, and tongue normal}  NECK: supple, thyroid normal size, non-tender, without nodularity LYMPH:  no palpable lymphadenopathy in the cervical, axillary {or inguinal} LUNGS: clear to auscultation and percussion with normal breathing effort HEART: regular rate &  rhythm and no murmurs and no lower extremity edema ABDOMEN:abdomen soft, non-tender and normal bowel sounds Musculoskeletal:no cyanosis of digits and no clubbing  NEURO: alert & oriented x 3 with fluent speech, no focal motor/sensory deficits  LABORATORY DATA:  I have reviewed the data as listed CBC Latest Ref Rng & Units 11/28/2020 11/08/2020  WBC  4.0 - 10.5 K/uL 5.1 7.0  Hemoglobin 12.0 - 15.0 g/dL 9.5(L) 9.6(L)  Hematocrit 36.0 - 46.0 % 32.1(L) 33.2(L)  Platelets 150 - 400 K/uL 278 428(H)     CMP Latest Ref Rng & Units 11/28/2020 11/08/2020  Glucose 70 - 99 mg/dL 230(H) 148(H)  BUN 8 - 23 mg/dL 12 11  Creatinine 0.44 - 1.00 mg/dL 0.94 0.84  Sodium 135 - 145 mmol/L 139 144  Potassium 3.5 - 5.1 mmol/L 3.6 4.3  Chloride 98 - 111 mmol/L 107 108  CO2 22 - 32 mmol/L 21(L) 25  Calcium 8.9 - 10.3 mg/dL 8.9 9.3  Total Protein 6.5 - 8.1 g/dL 6.9 7.5  Total Bilirubin 0.3 - 1.2 mg/dL 0.4 0.4  Alkaline Phos 38 - 126 U/L 109 115  AST 15 - 41 U/L 7(L) 19  ALT 0 - 44 U/L 6 23      RADIOGRAPHIC STUDIES: I have personally reviewed the radiological images as listed and agreed with the findings in the report. No results found.   ASSESSMENT & PLAN:  Pamela Kennedy is a 65 y.o. female with      1. Gallbladder cancercancer, pT3N1M0, stage IIIB  -She had stage IIIb gallbladder cancer diagnosed in 06/2020, which was removed completely by 2 surgeries, with negative margins. Last surgery in 09/2020.  -I started her on adjuvant Xeloda10108m/m2 bid day 1-14 every 21 days for 6 months on  11/14/20 -She has tolerated first cycle Xeloda well. She did have 1 week of significant constipation. I reviewed constipation management with her.  -Labs reviewed, CBC and CMP except Hg 9.5, BG 230. Will proceed with C2 Xeloda at same dose on 12/05/20.  -We discussed if she experience more side effects, we may reduce her xeloda dose  -F/u in 3 weeks.    2. Depression  -Continue her meds and f/u with her psychiatrist.   3. Low appetite and weight loss -will refer her to dietician  -Her weight is trending up.   4. Genetics -She has been seen by genetic counselor on 11/08/20. Genetic testing results still pending.  5. Anemia  -Her labs show moderate anemia with low MCV, MCH. Platelet was mildly elevated in 11/2020.  -Thalassemia work up is still  pending from 11/28/20.   6. DM, Hyperglycemia  -She is on metformin for her DM.  -After only Coffee this morning, her BG is 230. I discussed this is high and should be monitored closely.    PLAN: -continue Xeloda 2006mBID, start C2 on 12/05/20 -Lab and f/u in 3 weeks.     No problem-specific Assessment & Plan notes found for this encounter.   No orders of the defined types were placed in this encounter.  All questions were answered. The patient knows to call the clinic with any problems, questions or concerns. No barriers to learning was detected. The total time spent in the appointment was {CHL ONC TIME VISIT - SWXYVOP:[id number]}    AmJoslyn Devon/08/2021   I,Oneal Deputyam acting as scribe for YaTruitt MerleMD.   {Add scribe attestation statement}

## 2020-12-19 ENCOUNTER — Inpatient Hospital Stay: Payer: Medicare Other | Admitting: Hematology

## 2020-12-19 ENCOUNTER — Telehealth: Payer: Self-pay | Admitting: Hematology

## 2020-12-19 ENCOUNTER — Inpatient Hospital Stay: Payer: Medicare Other

## 2020-12-19 DIAGNOSIS — C23 Malignant neoplasm of gallbladder: Secondary | ICD-10-CM

## 2020-12-19 NOTE — Telephone Encounter (Signed)
Rescheduled today's appointment. Patient's daughter is aware of changes.

## 2020-12-20 ENCOUNTER — Encounter: Payer: Self-pay | Admitting: Hematology

## 2020-12-20 ENCOUNTER — Telehealth: Payer: Self-pay | Admitting: Hematology

## 2020-12-20 ENCOUNTER — Inpatient Hospital Stay (HOSPITAL_BASED_OUTPATIENT_CLINIC_OR_DEPARTMENT_OTHER): Payer: Medicare Other | Admitting: Hematology

## 2020-12-20 ENCOUNTER — Other Ambulatory Visit: Payer: Self-pay | Admitting: Hematology

## 2020-12-20 ENCOUNTER — Other Ambulatory Visit: Payer: Self-pay

## 2020-12-20 ENCOUNTER — Inpatient Hospital Stay: Payer: Medicare Other | Attending: Hematology

## 2020-12-20 VITALS — BP 125/81 | HR 111 | Temp 98.8°F | Resp 17 | Ht 64.0 in | Wt 186.6 lb

## 2020-12-20 DIAGNOSIS — C23 Malignant neoplasm of gallbladder: Secondary | ICD-10-CM

## 2020-12-20 DIAGNOSIS — F32A Depression, unspecified: Secondary | ICD-10-CM | POA: Diagnosis not present

## 2020-12-20 DIAGNOSIS — K912 Postsurgical malabsorption, not elsewhere classified: Secondary | ICD-10-CM | POA: Diagnosis not present

## 2020-12-20 DIAGNOSIS — Z7984 Long term (current) use of oral hypoglycemic drugs: Secondary | ICD-10-CM | POA: Diagnosis not present

## 2020-12-20 DIAGNOSIS — Z7982 Long term (current) use of aspirin: Secondary | ICD-10-CM | POA: Insufficient documentation

## 2020-12-20 DIAGNOSIS — R718 Other abnormality of red blood cells: Secondary | ICD-10-CM | POA: Diagnosis not present

## 2020-12-20 DIAGNOSIS — E1165 Type 2 diabetes mellitus with hyperglycemia: Secondary | ICD-10-CM | POA: Diagnosis not present

## 2020-12-20 DIAGNOSIS — Z1321 Encounter for screening for nutritional disorder: Secondary | ICD-10-CM

## 2020-12-20 DIAGNOSIS — Z9221 Personal history of antineoplastic chemotherapy: Secondary | ICD-10-CM | POA: Insufficient documentation

## 2020-12-20 DIAGNOSIS — Z79899 Other long term (current) drug therapy: Secondary | ICD-10-CM | POA: Diagnosis not present

## 2020-12-20 DIAGNOSIS — D649 Anemia, unspecified: Secondary | ICD-10-CM | POA: Diagnosis not present

## 2020-12-20 LAB — CMP (CANCER CENTER ONLY)
ALT: 7 U/L (ref 0–44)
AST: 8 U/L — ABNORMAL LOW (ref 15–41)
Albumin: 3.8 g/dL (ref 3.5–5.0)
Alkaline Phosphatase: 101 U/L (ref 38–126)
Anion gap: 14 (ref 5–15)
BUN: 11 mg/dL (ref 8–23)
CO2: 21 mmol/L — ABNORMAL LOW (ref 22–32)
Calcium: 8.9 mg/dL (ref 8.9–10.3)
Chloride: 106 mmol/L (ref 98–111)
Creatinine: 0.89 mg/dL (ref 0.44–1.00)
GFR, Estimated: >60 mL/min (ref >60)
Glucose, Bld: 188 mg/dL — ABNORMAL HIGH (ref 70–99)
Potassium: 3.3 mmol/L — ABNORMAL LOW (ref 3.5–5.1)
Sodium: 141 mmol/L (ref 135–145)
Total Bilirubin: 0.6 mg/dL (ref 0.3–1.2)
Total Protein: 6.9 g/dL (ref 6.5–8.1)

## 2020-12-20 LAB — CBC WITH DIFFERENTIAL (CANCER CENTER ONLY)
Abs Immature Granulocytes: 0.01 10*3/uL (ref 0.00–0.07)
Basophils Absolute: 0 10*3/uL (ref 0.0–0.1)
Basophils Relative: 0 %
Eosinophils Absolute: 0.1 10*3/uL (ref 0.0–0.5)
Eosinophils Relative: 2 %
HCT: 32.2 % — ABNORMAL LOW (ref 36.0–46.0)
Hemoglobin: 9.5 g/dL — ABNORMAL LOW (ref 12.0–15.0)
Immature Granulocytes: 0 %
Lymphocytes Relative: 26 %
Lymphs Abs: 1.3 10*3/uL (ref 0.7–4.0)
MCH: 19.3 pg — ABNORMAL LOW (ref 26.0–34.0)
MCHC: 29.5 g/dL — ABNORMAL LOW (ref 30.0–36.0)
MCV: 65.4 fL — ABNORMAL LOW (ref 80.0–100.0)
Monocytes Absolute: 0.2 10*3/uL (ref 0.1–1.0)
Monocytes Relative: 4 %
Neutro Abs: 3.2 10*3/uL (ref 1.7–7.7)
Neutrophils Relative %: 68 %
Platelet Count: 260 10*3/uL (ref 150–400)
RBC: 4.92 MIL/uL (ref 3.87–5.11)
RDW: 19.8 % — ABNORMAL HIGH (ref 11.5–15.5)
WBC Count: 4.8 10*3/uL (ref 4.0–10.5)
nRBC: 0 % (ref 0.0–0.2)

## 2020-12-20 MED ORDER — ONDANSETRON HCL 8 MG PO TABS: 8.0000 mg | ORAL_TABLET | Freq: Three times a day (TID) | ORAL | 1 refills | Status: DC | PRN

## 2020-12-20 NOTE — Telephone Encounter (Signed)
Scheduled appt per 3/15 los- gave pt daughter AVS

## 2020-12-20 NOTE — Progress Notes (Signed)
[name]   [phone] Fax:([phone]   Clinic Follow up Note   Patient Care Team: System, Provider Not In as PCP - General [name], MD (Psychiatry) Laretta Bolster, NP as Nurse Practitioner (Internal Medicine) Truitt Merle, MD as Consulting Physician (Oncology) Jonnie Finner, RN as Oncology Nurse Navigator  Date of Service:  12/20/2020  CHIEF COMPLAINT: F/u of Gall bladder cancer   SUMMARY OF ONCOLOGIC HISTORY: Oncology History Overview Note  Cancer Staging Primary gall bladder adenocarcinoma [facility]) Staging form: Gallbladder, AJCC 8th Edition - Pathologic stage from 09/20/2020: Stage IIIB (pT3, pN1, cM0) - Signed by Truitt Merle, MD on 11/03/2020 Histologic grade (G): G3 Histologic grading system: 3 grade system Residual tumor (R): R0 - None Histologic sub-type: Adenocarcinoma    Primary gall bladder adenocarcinoma (Pamela Kennedy)  07/05/2020 Initial Biopsy   Cholecystectomy by Dr Moreen Fowler   Final Diagnosis A. Gallbladder, Cholecystectomy:  -Invasive adenocarcinoma, moderately to poorly-differentiated, infiltrating into the perimuscular fibroconnective tissue.  -Multifocal perineural invasion identified.  -The cystic duct margin is negative for carcinoma.  -Gallbladder with acute necrotizing cholecystitis and cholelithiasis.     08/17/2020 Imaging   MRI Abdomen at [facility]  IMPRESSION -Limited evaluation secondary to motion and use of motion insensitive sequences with suboptimal arterial phase timing.   -Multiloculated collection within the gallbladder fossa with subtle peripheral enhancement may represent a postoperative seroma versus biloma. An abscess is thought to be unlikely.   -Prominent portacaval nodes, indeterminate and possibly reactive.   -Asymmetric left breast tissue likely fibroglandular tissue. Recommend  correlation with mammogram     08/25/2020 Imaging   CT Chest 08/25/20 at [facility] IMPRESSION:   No evidence of intrathoracic  metastatic disease.   Focal nodular asymmetries in the left breast requiring follow-up to exclude neoplastic etiology.   Recent cholecystectomy with slight inflammatory stranding at the gallbladder fossa.   Probable thyroid goiter with mild enlargement and hypodense lesions.   09/20/2020 Cancer Staging   Staging form: Gallbladder, AJCC 8th Edition - Pathologic stage from 09/20/2020: Stage IIIB (pT3, pN1, cM0) - Signed by Truitt Merle, MD on 11/03/2020   09/20/2020 Surgery   Surgery of liver and abdomen by Dr Olen Pel at [facility]    09/20/2020 Pathology Results    Final Diagnosis   A: Lymph nodes, common and proper hepatic, lymphadenectomy: -One of seven lymph nodes, positive for adenocarcinoma. (1/7) -The lymph nodes show nonnecrotizing granulomatous reaction.   B: Lymph nodes, hepatic duodenal, lymphadenectomy: -One of two lymph nodes, positive for adenocarcinoma. (1/2)   C: Cystic duct, biopsy: -Bile duct, negative for carcinoma.   D: Liver, partial hepatectomy (gallbladder fossa): -Adenocarcinoma, poorly-differentiated, 1.0 cm in size,  infiltrating liver. -The cauterized liver parenchymal resection margin is negative for carcinoma.  (See comment)   11/03/2020 Initial Diagnosis   Primary gall bladder adenocarcinoma (Pamela Kennedy)   11/08/2020 Tumor Marker   CA 19-9 - less than 2   11/14/2020 -  Chemotherapy   Xeloda 2068m Bid for day 1-14 every 21 days starting on 11/14/20    11/25/2020 Genetic Testing   Negative genetic testing:  No pathogenic variants detected on the [facility] Multi-Cancer + RNA panel. The report date is 11/25/2020.   The Multi-Cancer + RNA Panel offered by [facility] includes sequencing and/or deletion/duplication analysis of the following 84 genes:  AIP*, ALK, APC*, ATM*, AXIN2*, BAP1*, BARD1*, BLM*, BMPR1A*, BRCA1*, BRCA2*, BRIP1*, CASR, CDC73*, CDH1*, CDK4, CDKN1B*, CDKN1C*, CDKN2A, CEBPA, CHEK2*, CTNNA1*, DICER1*, DIS3L2*, EGFR, EPCAM, FH*, FLCN*, GATA2*, GPC3, GREM1, HOXB13,  HRAS,  KIT, MAX*, MEN1*, MET, MITF, MLH1*, MSH2*, MSH3*, MSH6*, MUTYH*, NBN*, NF1*, NF2*, NTHL1*, PALB2*, PDGFRA, PHOX2B, PMS2*, POLD1*, POLE*, POT1*, PRKAR1A*, PTCH1*, PTEN*, RAD50*, RAD51C*, RAD51D*, RB1*, RECQL4, RET, RUNX1*, SDHA*, SDHAF2*, SDHB*, SDHC*, SDHD*, SMAD4*, SMARCA4*, SMARCB1*, SMARCE1*, STK11*, SUFU*, TERC, TERT, TMEM127*, Tp53*, TSC1*, TSC2*, VHL*, WRN*, and WT1.  RNA analysis is performed for * genes.       CURRENT THERAPY:  Xeloda 2034m Bid for day 1-14 every 21 days starting on 11/14/20  INTERVAL HISTORY:  Pamela Kennedy here for a follow up. She presents to the clinic with her daughter. She completed the second cycle Xeloda and overall tolerated well. She did have mild intermittent nausea, no vomiting, BM normal. Appetite is low sometime, lost about 9 lbs in the past 3 weeks  No skin toxicities  Energy level is decent, able to function at home   All other systems were reviewed with the patient and are negative.  MEDICAL HISTORY:  Past Medical History:  Diagnosis Date  . Depression   . Diabetes [place]   . Family history of breast cancer   . Family history of prostate cancer   . Overactive bladder     SURGICAL HISTORY: Past Surgical History:  Procedure Laterality Date  . CHOLECYSTECTOMY      I have reviewed the social history and family history with the patient and they are unchanged from previous note.  ALLERGIES:  has No Known Allergies.  MEDICATIONS:  Current Outpatient Medications  Medication Sig Dispense Refill  . ondansetron (ZOFRAN) 8 MG tablet Take 1 tablet (8 mg total) by mouth every 8 (eight) hours as needed for nausea or vomiting. 30 tablet 1  . aspirin EC 81 MG tablet Take 81 mg by mouth daily. Swallow whole.    . benztropine (COGENTIN) 0.5 MG tablet Take 0.5 mg by mouth 2 (two) times daily.    . capecitabine (XELODA) 500 MG tablet Take 4 tablets (2,000 mg total) by mouth 2 (two) times daily after a meal. Take  for 14 days on, then 7 days off, repeat every 21 days. Plan to start on 11/14/2020 112 tablet 1  . fesoterodine (TOVIAZ) 8 MG TB24 tablet Take 8 mg by mouth daily.    .Marland Kitchenlovastatin (ALTOPREV) 20 MG 24 hr tablet Take 20 mg by mouth at bedtime.    . metFORMIN (GLUCOPHAGE) 500 MG tablet Take by mouth 2 (two) times daily with a meal.    . raloxifene (EVISTA) 60 MG tablet Take 60 mg by mouth daily.    . risperidone (RISPERDAL) 4 MG tablet Take 4 mg by mouth daily. At bedtime    . venlafaxine (EFFEXOR) 75 MG tablet Take 75 mg by mouth daily.    .Marland Kitchenvenlafaxine XR (EFFEXOR-XR) 150 MG 24 hr capsule Take 150 mg by mouth daily with breakfast.     No current facility-administered medications for this visit.    PHYSICAL EXAMINATION: ECOG PERFORMANCE STATUS: 2 - Symptomatic, <50% confined to bed  Vitals:   12/20/20 1132  BP: 125/81  Pulse: (!) 111  Resp: 17  Temp: 98.8 F (37.1 C)  SpO2: 100%   Filed Weights   12/20/20 1132  Weight: 186 lb 9.6 oz (84.6 kg)    Due to COVID19 we will limit examination to appearance. Patient had no complaints.  GENERAL:alert, no distress and comfortable SKIN: skin color normal, no rashes or significant lesions (+) Mild skin darkening of palms EYES: normal, Conjunctiva are pink and non-injected, sclera clear  NEURO: alert & oriented x 3 with fluent speech  LABORATORY DATA:  I have reviewed the data as listed CBC Latest Ref Rng & Units 12/20/2020 11/28/2020 11/08/2020  WBC 4.0 - 10.5 K/uL 4.8 5.1 7.0  Hemoglobin 12.0 - 15.0 g/dL 9.5(L) 9.5(L) 9.6(L)  Hematocrit 36.0 - 46.0 % 32.2(L) 32.1(L) 33.2(L)  Platelets 150 - 400 K/uL 260 278 428(H)     CMP Latest Ref Rng & Units 12/20/2020 11/28/2020 11/08/2020  Glucose 70 - 99 mg/dL 188(H) 230(H) 148(H)  BUN 8 - 23 mg/dL 11 12 11   Creatinine 0.44 - 1.00 mg/dL 0.89 0.94 0.84  Sodium 135 - 145 mmol/L 141 139 144  Potassium 3.5 - 5.1 mmol/L 3.3(L) 3.6 4.3  Chloride 98 - 111 mmol/L 106 107 108  CO2 22 - 32 mmol/L 21(L) 21(L)  25  Calcium 8.9 - 10.3 mg/dL 8.9 8.9 9.3  Total Protein 6.5 - 8.1 g/dL 6.9 6.9 7.5  Total Bilirubin 0.3 - 1.2 mg/dL 0.6 0.4 0.4  Alkaline Phos 38 - 126 U/L 101 109 115  AST 15 - 41 U/L 8(L) 7(L) 19  ALT 0 - 44 U/L 7 6 23       RADIOGRAPHIC STUDIES: I have personally reviewed the radiological images as listed and agreed with the findings in the report. No results found.   ASSESSMENT & PLAN:  Pamela Kennedy is a 65 y.o. female with   1. Gallbladder cancer cancer, pT3N1M0, stage IIIB  -She had stage IIIb gallbladder cancer diagnosed in 06/2020, which was removed completely by 2 surgeries, with negative margins. Last surgery in 09/2020.  -I started her on adjuvant Xeloda 1063m/m2 bid day 1-14 every 21 days for 6 months on  11/14/20 -She has tolerated first cycle Xeloda well. She did have 1 week of significant constipation. I reviewed constipation management with her.  -Labs reviewed, CBC and CMP except Hg 9.5, BG 188. Will proceed with C3 Xeloda at same dose on 12/26/2020. Her main complain is low appetite, if it gets worse, she will call me and we may reduce her dose on second week   -F/u in 3 weeks.    2. Depression  -Continue her meds and f/u with her psychiatrist.   3. Low appetite, fatigue and weight loss -Continue follow-up with dietitian -I reviewed nutritional supplement, and encouraged her to drink Ensure or boost, her daughter will buy some -I recommended her to monitor her weight weekly at home, and to call [place] is losing [place] -I strongly encouraged her to be more and we will refer her to outpatient physical therapy.  She is agreeable.  4. Genetics -She has been seen by genetic counselor on 11/08/20. Genetic testing results still pending.  5. Anemia  -Her labs show moderate anemia with low MCV, MCH. Platelet was mildly elevated in 11/2020.  -Her hemoglobin electrophoresis was normal -We will check alpha thalassemia genotype   6. DM, Hyperglycemia  -She is on  metformin for her DM.  -Continue monitoring   PLAN:  -continue Xeloda 20077mBID, start C3 on 12/27/2019 -Lab and f/u in 3 weeks.  -She will monitor her weight at home, and try nutritional supplement -PT referral for generalized weakness  No problem-specific Assessment & Plan notes found for this encounter.   Orders Placed This Encounter  Procedures  . Vitamin D 25 hydroxy    Standing Status:   Future    Standing Expiration Date:   12/20/2021  . Alpha-Thalassemia GenotypR    Standing Status:  Future    Standing Expiration Date:   12/20/2021  . Ambulatory referral to Physical Therapy    Referral Priority:   Routine    Referral Type:   Physical Medicine    Referral Reason:   Specialty Services Required    Requested Specialty:   Physical Therapy    Number of Visits Requested:   1   All questions were answered. The patient knows to call the clinic with any problems, questions or concerns. No barriers to learning was detected. The total time spent in the appointment was 30 minutes.     Truitt Merle, MD 12/20/2020   I, Joslyn Devon, am acting as scribe for Truitt Merle, MD.   I have reviewed the above documentation for accuracy and completeness, and I agree with the above.

## 2020-12-21 ENCOUNTER — Telehealth: Payer: Self-pay

## 2020-12-21 ENCOUNTER — Other Ambulatory Visit: Payer: Self-pay

## 2020-12-21 DIAGNOSIS — E876 Hypokalemia: Secondary | ICD-10-CM

## 2020-12-21 LAB — CANCER ANTIGEN 19-9: CA 19-9: <2 U/mL (ref 0–35)

## 2020-12-21 MED ORDER — POTASSIUM CHLORIDE ER 10 MEQ PO TBCR: 10.0000 meq | EXTENDED_RELEASE_TABLET | Freq: Every day | ORAL | 0 refills | Status: DC

## 2020-12-21 NOTE — Telephone Encounter (Signed)
I left a message for Ms Gest's daughter, Minna Merritts regarding Dr Lewayne Bunting comments and recommendations.  I also asked her to call with the pharmacy she would like prescription sent to.

## 2020-12-21 NOTE — Telephone Encounter (Signed)
-----   Message from Truitt Merle, MD sent at 12/21/2020  7:23 AM EDT ----- Please let pt or her daughter know her K was little low yesterday, and please call in KCL 51meq daily for 14 days, encourage K rich diet, thanks   Truitt Merle

## 2020-12-22 ENCOUNTER — Encounter: Payer: Self-pay | Admitting: Hematology

## 2020-12-29 ENCOUNTER — Other Ambulatory Visit (HOSPITAL_COMMUNITY): Payer: Self-pay

## 2020-12-30 ENCOUNTER — Other Ambulatory Visit (HOSPITAL_COMMUNITY): Payer: Self-pay

## 2020-12-30 ENCOUNTER — Telehealth: Payer: Self-pay

## 2020-12-30 NOTE — Telephone Encounter (Signed)
Pamela Kennedy's daughter Pamela Kennedy left vm stating that Pamela Kennedy is having stroke symptoms and they have called 911.

## 2021-01-06 NOTE — Progress Notes (Signed)
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 Gallbladder, Cholecystectomy:  -Invasive adenocarcinoma, moderately to poorly-differentiated, infiltrating into the perimuscular fibroconnective tissue.  -Multifocal perineural invasion identified.  -The cystic duct margin is negative for carcinoma.  -Gallbladder with acute necrotizing cholecystitis and cholelithiasis.     08/17/2020 Imaging   MRI Abdomen at [facility]  IMPRESSION -Limited evaluation secondary to motion and use of motion insensitive sequences with suboptimal arterial phase timing.   -Multiloculated collection within the gallbladder fossa with subtle peripheral enhancement may represent a postoperative seroma versus biloma. An abscess is thought to be unlikely.   -Prominent portacaval nodes, indeterminate and possibly reactive.   -Asymmetric left breast tissue likely fibroglandular tissue. Recommend  correlation with mammogram     08/25/2020 Imaging   CT Chest 08/25/20 at [facility] IMPRESSION:   No evidence of intrathoracic  metastatic disease.   Focal nodular asymmetries in the left breast requiring follow-up to exclude neoplastic etiology.   Recent cholecystectomy with slight inflammatory stranding at the gallbladder fossa.   Probable thyroid goiter with mild enlargement and hypodense lesions.   09/20/2020 Cancer Staging   Staging form: Gallbladder, AJCC 8th Edition - Pathologic stage from 09/20/2020: Stage IIIB (pT3, pN1, cM0) - Signed by Pamela Merle, MD on 11/03/2020   09/20/2020 Surgery   Surgery of liver and abdomen by Dr Pamela Kennedy at [facility]    09/20/2020 Pathology Results    Final Diagnosis   A: Lymph nodes, common and proper hepatic, lymphadenectomy: -One of seven lymph nodes, positive for adenocarcinoma. (1/7) -The lymph nodes show nonnecrotizing granulomatous reaction.   B: Lymph nodes, hepatic duodenal, lymphadenectomy: -One of two lymph nodes, positive for adenocarcinoma. (1/2)   C: Cystic duct, biopsy: -Bile duct, negative for carcinoma.   D: Liver, partial hepatectomy (gallbladder fossa): -Adenocarcinoma, poorly-differentiated, 1.0 cm in size,  infiltrating liver. -The cauterized liver parenchymal resection margin is negative for carcinoma.  (See comment)   11/03/2020 Initial Diagnosis   Primary gall bladder adenocarcinoma ([place])   11/08/2020 Tumor Marker   CA 19-9 - less than 2   11/14/2020 -  Chemotherapy   Xeloda 206m Bid for day 1-14 every 21 days starting on 11/14/20    11/25/2020 Genetic Testing   Negative genetic testing:  No pathogenic variants detected on the [facility] Multi-Cancer + RNA panel. The report date is 11/25/2020.   The Multi-Cancer + RNA Panel offered by [facility] includes sequencing and/or deletion/duplication analysis of the following 84 genes:  AIP*, ALK, APC*, ATM*, AXIN2*, BAP1*, BARD1*, BLM*, BMPR1A*, BRCA1*, BRCA2*, BRIP1*, CASR, CDC73*, CDH1*, CDK4, CDKN1B*, CDKN1C*, CDKN2A, CEBPA, CHEK2*, CTNNA1*, DICER1*, DIS3L2*, EGFR, EPCAM, FH*, FLCN*, GATA2*, GPC3, GREM1, HOXB13,  HRAS, KIT,  MAX*, MEN1*, MET, MITF, MLH1*, MSH2*, MSH3*, MSH6*, MUTYH*, NBN*, NF1*, NF2*, NTHL1*, PALB2*, PDGFRA, PHOX2B, PMS2*, POLD1*, POLE*, POT1*, PRKAR1A*, PTCH1*, PTEN*, RAD50*, RAD51C*, RAD51D*, RB1*, RECQL4, RET, RUNX1*, SDHA*, SDHAF2*, SDHB*, SDHC*, SDHD*, SMAD4*, SMARCA4*, SMARCB1*, SMARCE1*, STK11*, SUFU*, TERC, TERT, TMEM127*, Tp53*, TSC1*, TSC2*, VHL*, WRN*, and WT1.  RNA analysis is performed for * genes.       CURRENT THERAPY:  Xeloda 2023m Bid for day 1-14 every 21 days starting on 11/14/20. Reduced to 15068mBID starting with C4 on 01/23/21.   INTERVAL HISTORY:  [name] here for a follow up. She was last seen by me on 12/20/20. She presents to the clinic with her sister. She notes she is doing well. Her sister lives with her. She is on Xeloda. She has been eating less and has lost 6 pounds in 3 weeks. She notes she does not have appetite and has nausea. She does have antiemetics, but has not taken them. She notes nausea mostly occurs with eating. She has been weak also, mostly today. She denies fever, pain. She notes going from cold to hot.   She notes she had planned to start her cycle 4 on 4/11 but is willing to postpone.    REVIEW OF SYSTEMS:   Constitutional: Denies fevers, chills (+) Low appetite,  weight loss Eyes: Denies blurriness of vision Ears, nose, mouth, throat, and face: Denies mucositis or sore throat Respiratory: Denies cough, dyspnea or wheezes Cardiovascular: Denies palpitation, chest discomfort or lower extremity swelling Gastrointestinal:  Denies heartburn or change in bowel habits (+) Nausea  Skin: Denies abnormal skin rashes Lymphatics: Denies new lymphadenopathy or easy bruising Neurological:Denies numbness, tingling or new weaknesses Behavioral/Psych: Mood is stable, no new changes  All other systems were reviewed with the patient and are negative.  MEDICAL HISTORY:  Past Medical History:  Diagnosis Date  . Depression   . [place]  . Family history of breast cancer   . Family history of prostate cancer   . Overactive bladder     SURGICAL HISTORY: Past Surgical History:  Procedure Laterality Date  . CHOLECYSTECTOMY      I have reviewed the social history and family history with the patient and they are unchanged from previous note.  ALLERGIES:  has No Known Allergies.  MEDICATIONS:  Current Outpatient Medications  Medication Sig Dispense Refill  . megestrol (MEGACE ES) 625 MG/5ML suspension Take 5 mLs (625 mg total) by mouth daily. 150 mL 0  . aspirin EC 81 MG tablet Take 81 mg by mouth daily. Swallow whole.    . benztropine (COGENTIN) 0.5 MG tablet Take 0.5 mg by mouth 2 (two) times daily.    . capecitabine (XELODA) 500 MG tablet TAKE 4 TABLETS (2,000 MG TOTAL) BY MOUTH 2 (TWO) TIMES DAILY AFTER A MEAL. TAKE FOR 14 DAYS ON, THEN 7 DAYS OFF, REPEAT EVERY 21 DAYS. 112 tablet 1  . fesoterodine (TOVIAZ) 8 MG TB24 tablet Take 8 mg by mouth daily.    . Marland Kitchenovastatin (ALTOPREV) 20 MG 24 hr tablet Take 20 mg by mouth at bedtime.    . metFORMIN (GLUCOPHAGE) 500 MG tablet Take by mouth 2 (two) times daily with a meal.    . ondansetron (ZOFRAN) 8 MG tablet TAKE 1 TABLET BY MOUTH EVERY 8 HOURS AS NEEDED FOR NAUSEA OR VOMITING 30 tablet 1  . potassium chloride (KLOR-CON) 10 MEQ tablet TAKE 1 TABLET BY MOUTH ONCE A DAY 14 tablet 0  . raloxifene (  EVISTA) 60 MG tablet Take 60 mg by mouth daily.    . risperidone (RISPERDAL) 4 MG tablet Take 4 mg by mouth daily. At bedtime    . venlafaxine (EFFEXOR) 75 MG tablet Take 75 mg by mouth daily.    Marland Kitchen venlafaxine XR (EFFEXOR-XR) 150 MG 24 hr capsule Take 150 mg by mouth daily with breakfast.     No current facility-administered medications for this visit.    PHYSICAL EXAMINATION: ECOG PERFORMANCE STATUS: 2 - Symptomatic, <50% confined to bed  Vitals:   01/11/21 0848  BP: 134/82  Pulse: (!) 109  Resp: 17  Temp: (!) 97.1 F (36.2 C)  SpO2:  97%   Filed Weights   01/11/21 0848  Weight: 181 lb 4.8 oz (82.2 kg)    GENERAL:alert, no distress and comfortable SKIN: skin color, texture, turgor are normal, no rashes or significant lesions EYES: normal, Conjunctiva are pink and non-injected, sclera clear  NECK: supple, thyroid normal size, non-tender, without nodularity LYMPH:  no palpable lymphadenopathy in the cervical, axillary  LUNGS: clear to auscultation and percussion with normal breathing effort HEART: regular rate & rhythm and no murmurs and no lower extremity edema ABDOMEN:abdomen soft, non-tender and normal bowel sounds (+) Surgical incision healed well.  Musculoskeletal:no cyanosis of digits and no clubbing  NEURO: alert & oriented x 3 with fluent speech, no focal motor/sensory deficits  LABORATORY DATA:  I have reviewed the data as listed CBC Latest Ref Rng & Units 01/11/2021 12/20/2020 11/28/2020  WBC 4.0 - 10.5 K/uL 6.5 4.8 5.1  Hemoglobin 12.0 - 15.0 g/dL 9.3(L) 9.5(L) 9.5(L)  Hematocrit 36.0 - 46.0 % 31.3(L) 32.2(L) 32.1(L)  Platelets 150 - 400 K/uL 259 260 278     CMP Latest Ref Rng & Units 01/11/2021 12/20/2020 11/28/2020  Glucose 70 - 99 mg/dL 154(H) 188(H) 230(H)  BUN 8 - 23 mg/dL 8 11 12   Creatinine 0.44 - 1.00 mg/dL 0.85 0.89 0.94  Sodium 135 - 145 mmol/L 142 141 139  Potassium 3.5 - 5.1 mmol/L 3.0(L) 3.3(L) 3.6  Chloride 98 - 111 mmol/L 102 106 107  CO2 22 - 32 mmol/L 23 21(L) 21(L)  Calcium 8.9 - 10.3 mg/dL 8.7(L) 8.9 8.9  Total Protein 6.5 - 8.1 g/dL 6.6 6.9 6.9  Total Bilirubin 0.3 - 1.2 mg/dL 0.9 0.6 0.4  Alkaline Phos 38 - 126 U/L 111 101 109  AST 15 - 41 U/L 9(L) 8(L) 7(L)  ALT 0 - 44 U/L 8 7 6       RADIOGRAPHIC STUDIES: I have personally reviewed the radiological images as listed and agreed with the findings in the report. No results found.   ASSESSMENT & PLAN:  Pamela Kennedy is a 65 y.o. female with    1. Gallbladder cancercancer, pT3N1M0, stage IIIB  -She had stage IIIb  gallbladder cancer diagnosed in 06/2020, which was removed completely by 2 surgeries, with negative margins. Last surgery in 09/2020.  -I started her on adjuvant Xeloda1011m/m2 bid day 1-14 every 21 days for 6 months on  11/14/20.  -S/p C3 she is tolerating with nausea, low appetite, fatigue and weight loss. I reviewed management with her. Will reduce dose to 15053mBID and postpone C4 until 01/23/21. She is agreeable.  -Labs reviewed, Hg 9.3. Physical exam with mild upper abdominal tenderness.  -F/u in 3 weeks when she is on C4 Day 3 Xeloda  2. Low appetite, fatigue, nausea and weight loss, secondary to chemo  -She is mostly nauseous with eating. Her appetite remains low  and continue to lose weight.  -I recommend she take antiemetics 30 minutes before each meal. I will call in appetite stimulus Megace (01/11/21).  -She has Ensure, but does not like the taste. I recommend she eat high protein, high calorie foods. I recommend she continues to f/u with dietician.   3. Genetics testing: Her 11/2020 results were negative for pathogenetic mutations.   4. Anemia  -Her labs show moderate anemia with low MCV, MCH. Platelet was mildly elevated in 11/2020.  -Her hemoglobin electrophoresis was normal -Her labs remains with moderate anemia. Will check alpha thalassemia genotype today (01/11/21).   5. DM, Hyperglycemia, Depression  -She is on metformin for her DM. Continue monitoring with PCP -For her depression, she will continue her meds and f/u with her psychiatrist.   6. Tobacco Use  -She uses Nicotine dip which her sister raises concern about. I discussed this can lead to lung, mouth and heart complications and recommended cessation.   PLAN: -I called in Megace today  -Will reduce and postpone C4 to Xeloda to 1578m BID starting 01/23/21 (postpone for one week) -Lab and F/u in 3 weeks  -Copy note to dietician for f/u    No problem-specific Assessment & Plan notes found for this encounter.   No  orders of the defined types were placed in this encounter.  All questions were answered. The patient knows to call the clinic with any problems, questions or concerns. No barriers to learning was detected. The total time spent in the appointment was 30 minutes.     YTruitt Merle MD 01/11/2021   I, AJoslyn Devon am acting as scribe for YTruitt Merle MD.   I have reviewed the above documentation for accuracy and completeness, and I agree with the above.

## 2021-01-10 ENCOUNTER — Other Ambulatory Visit: Payer: Self-pay | Admitting: Hematology

## 2021-01-10 ENCOUNTER — Other Ambulatory Visit (HOSPITAL_COMMUNITY): Payer: Self-pay

## 2021-01-10 DIAGNOSIS — C23 Malignant neoplasm of gallbladder: Secondary | ICD-10-CM

## 2021-01-11 ENCOUNTER — Other Ambulatory Visit: Payer: Self-pay

## 2021-01-11 ENCOUNTER — Encounter: Payer: Self-pay | Admitting: Hematology

## 2021-01-11 ENCOUNTER — Inpatient Hospital Stay: Payer: Medicare Other | Attending: Hematology | Admitting: Hematology

## 2021-01-11 ENCOUNTER — Ambulatory Visit: Payer: Medicare Other | Admitting: Nutrition

## 2021-01-11 ENCOUNTER — Other Ambulatory Visit (HOSPITAL_COMMUNITY): Payer: Self-pay

## 2021-01-11 ENCOUNTER — Inpatient Hospital Stay: Payer: Medicare Other

## 2021-01-11 VITALS — BP 134/82 | HR 109 | Temp 97.1°F | Resp 17 | Wt 181.3 lb

## 2021-01-11 DIAGNOSIS — C23 Malignant neoplasm of gallbladder: Secondary | ICD-10-CM

## 2021-01-11 DIAGNOSIS — Z79899 Other long term (current) drug therapy: Secondary | ICD-10-CM | POA: Insufficient documentation

## 2021-01-11 DIAGNOSIS — R609 Edema, unspecified: Secondary | ICD-10-CM | POA: Diagnosis not present

## 2021-01-11 DIAGNOSIS — R718 Other abnormality of red blood cells: Secondary | ICD-10-CM

## 2021-01-11 DIAGNOSIS — Z720 Tobacco use: Secondary | ICD-10-CM | POA: Insufficient documentation

## 2021-01-11 DIAGNOSIS — Z9049 Acquired absence of other specified parts of digestive tract: Secondary | ICD-10-CM | POA: Insufficient documentation

## 2021-01-11 DIAGNOSIS — E1165 Type 2 diabetes mellitus with hyperglycemia: Secondary | ICD-10-CM | POA: Insufficient documentation

## 2021-01-11 DIAGNOSIS — F32A Depression, unspecified: Secondary | ICD-10-CM | POA: Diagnosis not present

## 2021-01-11 DIAGNOSIS — Z1321 Encounter for screening for nutritional disorder: Secondary | ICD-10-CM

## 2021-01-11 DIAGNOSIS — K912 Postsurgical malabsorption, not elsewhere classified: Secondary | ICD-10-CM

## 2021-01-11 LAB — CBC WITH DIFFERENTIAL (CANCER CENTER ONLY)
Abs Immature Granulocytes: 0.01 10*3/uL (ref 0.00–0.07)
Basophils Absolute: 0 10*3/uL (ref 0.0–0.1)
Basophils Relative: 1 %
Eosinophils Absolute: 0.1 10*3/uL (ref 0.0–0.5)
Eosinophils Relative: 2 %
HCT: 31.3 % — ABNORMAL LOW (ref 36.0–46.0)
Hemoglobin: 9.3 g/dL — ABNORMAL LOW (ref 12.0–15.0)
Immature Granulocytes: 0 %
Lymphocytes Relative: 25 %
Lymphs Abs: 1.6 10*3/uL (ref 0.7–4.0)
MCH: 20 pg — ABNORMAL LOW (ref 26.0–34.0)
MCHC: 29.7 g/dL — ABNORMAL LOW (ref 30.0–36.0)
MCV: 67.2 fL — ABNORMAL LOW (ref 80.0–100.0)
Monocytes Absolute: 0.5 10*3/uL (ref 0.1–1.0)
Monocytes Relative: 7 %
Neutro Abs: 4.3 10*3/uL (ref 1.7–7.7)
Neutrophils Relative %: 65 %
Platelet Count: 259 10*3/uL (ref 150–400)
RBC: 4.66 MIL/uL (ref 3.87–5.11)
RDW: 24 % — ABNORMAL HIGH (ref 11.5–15.5)
WBC Count: 6.5 10*3/uL (ref 4.0–10.5)
nRBC: 0 % (ref 0.0–0.2)

## 2021-01-11 LAB — CMP (CANCER CENTER ONLY)
ALT: 8 U/L (ref 0–44)
AST: 9 U/L — ABNORMAL LOW (ref 15–41)
Albumin: 3.7 g/dL (ref 3.5–5.0)
Alkaline Phosphatase: 111 U/L (ref 38–126)
Anion gap: 17 — ABNORMAL HIGH (ref 5–15)
BUN: 8 mg/dL (ref 8–23)
CO2: 23 mmol/L (ref 22–32)
Calcium: 8.7 mg/dL — ABNORMAL LOW (ref 8.9–10.3)
Chloride: 102 mmol/L (ref 98–111)
Creatinine: 0.85 mg/dL (ref 0.44–1.00)
GFR, Estimated: >60 mL/min (ref >60)
Glucose, Bld: 154 mg/dL — ABNORMAL HIGH (ref 70–99)
Potassium: 3 mmol/L — ABNORMAL LOW (ref 3.5–5.1)
Sodium: 142 mmol/L (ref 135–145)
Total Bilirubin: 0.9 mg/dL (ref 0.3–1.2)
Total Protein: 6.6 g/dL (ref 6.5–8.1)

## 2021-01-11 LAB — VITAMIN D 25 HYDROXY (VIT D DEFICIENCY, FRACTURES): Vit D, 25-Hydroxy: 11.88 ng/mL — ABNORMAL LOW (ref 30–100)

## 2021-01-11 MED ORDER — MEGESTROL ACETATE 625 MG/5ML PO SUSP
625.0000 mg | Freq: Every day | ORAL | 0 refills | Status: DC
  Filled 2021-01-11: qty 150, 30d supply, fill #0

## 2021-01-11 NOTE — Progress Notes (Signed)
Nutrition follow-up completed with patient and her sister status post MD visit for gallbladder cancer.  Dr. Burr Medico requested nutrition consult today.  Weight decreased and documented as 181.3 pounds April 6 down from 194.2 pounds February 1.  This is a 7% weight loss over 2 months. Patient reports she has nausea almost daily right before dinner.  She takes nausea medication but it does not always help.  I am unclear as to whether she is taking this on a regular basis with nausea.  She likes Glucerna but has not been drinking this recently.  Reports family sometimes restricts diet and is offering food she does not like.  She wants to know what she can eat.  Reports formed bowel movement 3-4 times daily.  Nutrition diagnosis: Food and nutrition related knowledge deficit continues.  Intervention: Educated patient and sister to consume smaller more frequent meals and snacks with higher calorie, higher protein foods.  Encourage patient to avoid concentrated sweets however not to restrict complex carbohydrates or whole fruit.  Education provided on combining whole fruit with a protein source for better glycemic control.  Provided nutrition fact sheets summarizing this information. Recommended patient consume Glucerna or equivalent twice daily between meals.  Provided samples.  Also provided coupons.  Provided a sample of [employer] 1.2 glucose support shake (300 kcal, 16 gm pro, 26 gm CHO) for patient to try and will order additional samples at sister's request. Questions were answered and teach back method used.  Provided contact information.  Monitoring, evaluation, goals: Patient will tolerate adequate calories and protein to minimize further weight loss and promote adequate glycemic control.  Next visit: Wednesday, April 27 after MD.  **Disclaimer: This note was dictated with voice recognition software. Similar sounding words can inadvertently be transcribed and this note may contain transcription  errors which may not have been corrected upon publication of note.**

## 2021-01-12 ENCOUNTER — Other Ambulatory Visit (HOSPITAL_COMMUNITY): Payer: Self-pay

## 2021-01-13 ENCOUNTER — Other Ambulatory Visit (HOSPITAL_COMMUNITY): Payer: Self-pay

## 2021-01-19 ENCOUNTER — Other Ambulatory Visit (HOSPITAL_COMMUNITY): Payer: Self-pay

## 2021-01-19 ENCOUNTER — Encounter: Payer: Self-pay | Admitting: Hematology

## 2021-01-19 LAB — ALPHA-THALASSEMIA GENOTYPR

## 2021-01-20 ENCOUNTER — Other Ambulatory Visit (HOSPITAL_COMMUNITY): Payer: Self-pay

## 2021-01-20 ENCOUNTER — Other Ambulatory Visit: Payer: Self-pay | Admitting: *Deleted

## 2021-01-20 DIAGNOSIS — C23 Malignant neoplasm of gallbladder: Secondary | ICD-10-CM

## 2021-01-20 MED ORDER — CAPECITABINE 500 MG PO TABS
ORAL_TABLET | ORAL | 1 refills | Status: DC
  Filled 2021-01-20: qty 112, 21d supply, fill #0

## 2021-01-24 ENCOUNTER — Other Ambulatory Visit (HOSPITAL_COMMUNITY): Payer: Self-pay

## 2021-01-25 ENCOUNTER — Telehealth: Payer: Self-pay

## 2021-01-25 NOTE — Telephone Encounter (Signed)
Pamela Kennedy returned call to this nurse concerning lab work made her aware of labs and awaiting to confirm with provider increase in potassium no questions or concerns voiced encouraged to call for any questions concerns or changes

## 2021-01-25 NOTE — Telephone Encounter (Signed)
-----   Message from Truitt Merle, MD sent at 01/25/2021  7:00 AM EDT ----- Please let pt know her K was low on last visit, I do not see KCl on her med list, please call in KCL 72meq daily for her #30, thanks  Truitt Merle  01/25/2021

## 2021-01-27 ENCOUNTER — Encounter: Payer: Self-pay | Admitting: Hematology

## 2021-01-27 NOTE — Progress Notes (Addendum)
[place]   [phone] [phone]   Clinic Follow up Note   Patient Care Team: System, Provider Not In as PCP - General [name], MD (Psychiatry) Laretta Bolster, NP as Nurse Practitioner (Internal Medicine) Truitt Merle, MD as Consulting Physician (Oncology) Jonnie Finner, RN as Oncology Nurse Navigator  Date of Service:  02/01/2021  CHIEF COMPLAINT: F/u of Gall bladder cancer  SUMMARY OF ONCOLOGIC HISTORY: Oncology History Overview Note  Cancer Staging Primary gall bladder adenocarcinoma [facility]) Staging form: Gallbladder, AJCC 8th Edition - Pathologic stage from 09/20/2020: Stage IIIB (pT3, pN1, cM0) - Signed by Truitt Merle, MD on 11/03/2020 Histologic grade (G): G3 Histologic grading system: 3 grade system Residual tumor (R): R0 - None Histologic sub-type: Adenocarcinoma    Primary gall bladder adenocarcinoma ([place])  07/05/2020 Initial Biopsy   Cholecystectomy by Dr Moreen Fowler   Final Diagnosis A. Gallbladder, Cholecystectomy:  -Invasive adenocarcinoma, moderately to poorly-differentiated, infiltrating into the perimuscular fibroconnective tissue.  -Multifocal perineural invasion identified.  -The cystic duct margin is negative for carcinoma.  -Gallbladder with acute necrotizing cholecystitis and cholelithiasis.     08/17/2020 Imaging   MRI Abdomen at [facility]  IMPRESSION -Limited evaluation secondary to motion and use of motion insensitive sequences with suboptimal arterial phase timing.   -Multiloculated collection within the gallbladder fossa with subtle peripheral enhancement may represent a postoperative seroma versus biloma. An abscess is thought to be unlikely.   -Prominent portacaval nodes, indeterminate and possibly reactive.   -Asymmetric left breast tissue likely fibroglandular tissue. Recommend  correlation with mammogram     08/25/2020 Imaging   CT Chest 08/25/20 at [facility] IMPRESSION:   No evidence of intrathoracic  metastatic disease.   Focal nodular asymmetries in the left breast requiring follow-up to exclude neoplastic etiology.   Recent cholecystectomy with slight inflammatory stranding at the gallbladder fossa.   Probable thyroid goiter with mild enlargement and hypodense lesions.   09/20/2020 Cancer Staging   Staging form: Gallbladder, AJCC 8th Edition - Pathologic stage from 09/20/2020: Stage IIIB (pT3, pN1, cM0) - Signed by Truitt Merle, MD on 11/03/2020   09/20/2020 Surgery   Surgery of liver and abdomen by Dr Olen Pel at [facility]    09/20/2020 Pathology Results    Final Diagnosis   A: Lymph nodes, common and proper hepatic, lymphadenectomy: -One of seven lymph nodes, positive for adenocarcinoma. (1/7) -The lymph nodes show nonnecrotizing granulomatous reaction.   B: Lymph nodes, hepatic duodenal, lymphadenectomy: -One of two lymph nodes, positive for adenocarcinoma. (1/2)   C: Cystic duct, biopsy: -Bile duct, negative for carcinoma.   D: Liver, partial hepatectomy (gallbladder fossa): -Adenocarcinoma, poorly-differentiated, 1.0 cm in size,  infiltrating liver. -The cauterized liver parenchymal resection margin is negative for carcinoma.  (See comment)   11/03/2020 Initial Diagnosis   Primary gall bladder adenocarcinoma ([place])   11/08/2020 Tumor Marker   CA 19-9 - less than 2   11/14/2020 -  Chemotherapy   Xeloda 2025m Bid for day 1-14 every 21 days starting on 11/14/20    11/25/2020 Genetic Testing   Negative genetic testing:  No pathogenic variants detected on the [facility] Multi-Cancer + RNA panel. The report date is 11/25/2020.   The Multi-Cancer + RNA Panel offered by [facility] includes sequencing and/or deletion/duplication analysis of the following 84 genes:  AIP*, ALK, APC*, ATM*, AXIN2*, BAP1*, BARD1*, BLM*, BMPR1A*, BRCA1*, BRCA2*, BRIP1*, CASR, CDC73*, CDH1*, CDK4, CDKN1B*, CDKN1C*, CDKN2A, CEBPA, CHEK2*, CTNNA1*, DICER1*, DIS3L2*, EGFR, EPCAM, FH*, FLCN*, GATA2*, GPC3, GREM1, HOXB13,  HRAS, KIT,  MAX*, MEN1*, MET, MITF, MLH1*, MSH2*, MSH3*, MSH6*, MUTYH*, NBN*, NF1*, NF2*, NTHL1*, PALB2*, PDGFRA, PHOX2B, PMS2*, POLD1*, POLE*, POT1*, PRKAR1A*, PTCH1*, PTEN*, RAD50*, RAD51C*, RAD51D*, RB1*, RECQL4, RET, RUNX1*, SDHA*, SDHAF2*, SDHB*, SDHC*, SDHD*, SMAD4*, SMARCA4*, SMARCB1*, SMARCE1*, STK11*, SUFU*, TERC, TERT, TMEM127*, Tp53*, TSC1*, TSC2*, VHL*, WRN*, and WT1.  RNA analysis is performed for * genes.       CURRENT THERAPY:  Xeloda 2064m Bid for day 1-14 every 21 days starting on 11/14/20. Reduced to 15051mBID starting with C4 on 01/23/21. From C5 starting on 5/9, will reduce Xeloda to 150044mID 1 week on/1 week off.  INTERVAL HISTORY:  Pamela Kennedy here for a follow up. She was last seen by me 01/11/21. She presents to the clinic with her daughter. She has issues with her medication. She is living with her sister and has home nurse aid. She is overall at home alone. She notes issues with her medications, where there is the same pill in 2 different medication bottles. I reviewed her medication list with her. She plans to f/u with her PCP MarCassell Clement June. She notes she took last cycle Xeloda 2000m30mD, she is currently on week 2.   She notes she has feet swelling. She is eating better with megace as needed and able to gain weight. She was recently seen by Neurologist as her balance has been off. She has been bumping into things and falling even with her walker. Last fall was 5 days ago going up the stairs and in the bathroom. She plans to have Brain MRI on 02/06/21. She notes there is concern for recent stroke or the effects from her being on her Psych medications. She and her daughter are interested in more help at home.  Her daughter notes she faxed her FMLA paperwork to be filled out in Early April.     REVIEW OF SYSTEMS:   Constitutional: Denies fevers, chills or abnormal weight loss Eyes: Denies blurriness of vision Ears, nose, mouth, throat, and face: Denies  mucositis or sore throat Respiratory: Denies cough, dyspnea or wheezes Cardiovascular: Denies palpitation, chest discomfort (+) lower extremity swelling Gastrointestinal:  Denies nausea, heartburn or change in bowel habits Skin: Denies abnormal skin rashes Lymphatics: Denies new lymphadenopathy or easy bruising Neurological:Denies numbness, tingling or new weaknesses (+) Unsteady balance  Behavioral/Psych: Mood is stable, no new changes  All other systems were reviewed with the patient and are negative.  MEDICAL HISTORY:  Past Medical History:  Diagnosis Date  . Depression   . Diabetes mellitus without complication (HCC)[place]. Family history of breast cancer   . Family history of prostate cancer   . Overactive bladder     SURGICAL HISTORY: Past Surgical History:  Procedure Laterality Date  . CHOLECYSTECTOMY      I have reviewed the social history and family history with the patient and they are unchanged from previous note.  ALLERGIES:  has No Known Allergies.  MEDICATIONS:  Current Outpatient Medications  Medication Sig Dispense Refill  . aspirin EC 81 MG tablet Take 81 mg by mouth daily. Swallow whole.    . benztropine (COGENTIN) 0.5 MG tablet Take 0.5 mg by mouth 2 (two) times daily.    . capecitabine (XELODA) 500 MG tablet Take 3 tabs twice a day every 12 hours, for 7 days on then 7 days off 84 tablet 0  . fesoterodine (TOVIAZ) 8 MG TB24 tablet Take 8 mg by mouth daily.    . loMarland Kitchenastatin (ALTOPREV) 20  MG 24 hr tablet Take 20 mg by mouth at bedtime.    . megestrol (MEGACE ES) 625 MG/5ML suspension Take 5 mLs (625 mg total) by mouth daily. 150 mL 0  . metFORMIN (GLUCOPHAGE) 500 MG tablet Take by mouth 2 (two) times daily with a meal.    . ondansetron (ZOFRAN) 8 MG tablet TAKE 1 TABLET BY MOUTH EVERY 8 HOURS AS NEEDED FOR NAUSEA OR VOMITING 30 tablet 1  . potassium chloride (KLOR-CON) 10 MEQ tablet TAKE 1 TABLET BY MOUTH ONCE A DAY 14 tablet 0  . raloxifene (EVISTA) 60 MG  tablet Take 60 mg by mouth daily.    . risperidone (RISPERDAL) 4 MG tablet Take 4 mg by mouth daily. At bedtime    . venlafaxine (EFFEXOR) 75 MG tablet Take 75 mg by mouth daily.    Marland Kitchen venlafaxine XR (EFFEXOR-XR) 150 MG 24 hr capsule Take 150 mg by mouth daily with breakfast.     No current facility-administered medications for this visit.    PHYSICAL EXAMINATION: ECOG PERFORMANCE STATUS: 3 - Symptomatic, >50% confined to bed  Vitals:   02/01/21 1047  BP: 124/75  Pulse: (!) 123  Resp: 20  Temp: 97.7 F (36.5 C)  SpO2: 100%   Filed Weights   02/01/21 1047  Weight: 186 lb 14.4 oz (84.8 kg)    GENERAL:alert, no distress and comfortable SKIN: skin color, texture, turgor are normal, no rashes or significant lesions EYES: normal, Conjunctiva are pink and non-injected, sclera clear NECK: supple, thyroid normal size, non-tender, without nodularity LYMPH:  no palpable lymphadenopathy in the cervical, axillary  LUNGS: clear to auscultation and percussion with normal breathing effort HEART: regular rate & rhythm and no murmurs (+)  lower extremity edema, L>R ABDOMEN:abdomen soft, non-tender and normal bowel sounds Musculoskeletal:no cyanosis of digits and no clubbing  NEURO: alert & oriented x 3 with fluent speech, no focal motor/sensory deficits  LABORATORY DATA:  I have reviewed the data as listed CBC Latest Ref Rng & Units 02/01/2021 01/11/2021 12/20/2020  WBC 4.0 - 10.5 K/uL 8.0 6.5 4.8  Hemoglobin 12.0 - 15.0 g/dL 10.2(L) 9.3(L) 9.5(L)  Hematocrit 36.0 - 46.0 % 33.1(L) 31.3(L) 32.2(L)  Platelets 150 - 400 K/uL 244 259 260     CMP Latest Ref Rng & Units 02/01/2021 01/11/2021 12/20/2020  Glucose 70 - 99 mg/dL 184(H) 154(H) 188(H)  BUN 8 - 23 mg/dL 11 8 11   Creatinine 0.44 - 1.00 mg/dL 0.90 0.85 0.89  Sodium 135 - 145 mmol/L 143 142 141  Potassium 3.5 - 5.1 mmol/L 3.6 3.0(L) 3.3(L)  Chloride 98 - 111 mmol/L 107 102 106  CO2 22 - 32 mmol/L 21(L) 23 21(L)  Calcium 8.9 - 10.3 mg/dL  9.3 8.7(L) 8.9  Total Protein 6.5 - 8.1 g/dL 7.0 6.6 6.9  Total Bilirubin 0.3 - 1.2 mg/dL 0.7 0.9 0.6  Alkaline Phos 38 - 126 U/L 98 111 101  AST 15 - 41 U/L 10(L) 9(L) 8(L)  ALT 0 - 44 U/L 7 8 7       RADIOGRAPHIC STUDIES: I have personally reviewed the radiological images as listed and agreed with the findings in the report. No results found.   ASSESSMENT & PLAN:  Pamela Kennedy is a 65 y.o. female with    1. Gallbladder cancercancer, pT3N1M0, stage IIIB  -She had stage IIIb gallbladder cancer diagnosed in 06/2020, which was removed completely by 2 surgeries, with negative margins. Last surgery in 09/2020.  -I started her on adjuvant Xeloda10102m/m2 bid day  1-14 every 21 days for 6 months on 11/14/20. Given decreased tolerance, I reduced and postponed her Xeloda to 1515m BID starting 01/23/21.  -With C4 Xeloda she did not reduce dose. She is now more fatigued, lethargic with unsteady balance and falls. Labs reviewed, Hg 10.2. She will stop current cycle Xeloda after tomorrow (02/02/21) to give more time to recover. From C5 starting on 5/9, will reduce Xeloda to 15061mBID 1 week on/1 week off.  -F/u on 5/23 with start of C6.   2. Falls, imbalance Lethargy  -In recent weeks she has been more unsteady with frequent falls. I recommend she ambulate with cane or walker at all times.  -She also has slurred speak and lethargy. Family is concerned for stroke or this is due to her Psych meds (she has had issues with keeping medications well managed). She has seen Neurology and has Brain MRI scheduled for 02/06/21. I have requested copy of report.  -[place] PT.  -She is currently living with her sister and gets help from her daughter who lives out of town. She overall is interested in more help at home. My Nurse will look into available local Advanced home care for her.   3. Low appetite,fatigue, nausea and  weight loss, secondary to chemo  -She is mostly nauseous with eating. Her appetite remains low and continue to lose weight.  -With Zofran and Megace as needed her appetite has improved and she was able to eat more and gain weight. Will continue.  -Given she does not like Ensure, I recommend she eat high protein, high calorie foods. I recommend she continues to f/u with dietician.   4. Genetics testing: Her 11/2020 results were negative for pathogenetic mutations.   5. Anemia  -Her labs show moderate anemia with low MCV, MCH. Platelet was mildly elevated in 11/2020. -Her hemoglobin electrophoresis was normal -Her labs remains with moderate anemia. Will check alpha thalassemiagenotypetoday (01/11/21).   6. DM, Hyperglycemia, Depression  -She is on metformin for her DM.Continue monitoring with PCP -For her depression, she will continue her meds and f/u with her psychiatrist.   7. Tobacco Use  -She uses Nicotine dip which her sister raises concern about. I discussed this can lead to lung, mouth and heart complications and recommended cessation.  -She will f/u with PCP about more treatment options.   8. LE Edema  -She has recent LE edema. Today her Left leg is larger than her right on exam (02/01/21). I recommend Doppler to evaluate for blood clots. If negative, may given Lasix. She is agreeable. -I recommend she use compression socks and elevate her feet at home.   9. Urinary incontinence  -She had overactive bladder in the past -She has noticed urinary incontinence for the past 2 months, occasional stool incontinence.  She denies any significant back pain or other new neurological symptoms except falls  -This is likely related to overall fatigue from chemo. She has brain MRI pending  -I suggested her to follow-up with PCP.   PLAN: -[place] days of C4, from 4/29 -Start C5 Xeloda at 150074m week on/1 week off beginning 5/9 -Brain MRI with  [facility] on 5/2 -Lab and f/u on 5/23 with start of C6 -FMLA paperwork for her daughter.  -f/u with PCP asap  -Home care referral for nursing for medication management  -  continue PT  -plan to repeat CT scan next months    No problem-specific Assessment & Plan notes found for this encounter.   No orders of the defined types were placed in this encounter.  All questions were answered. The patient knows to call the clinic with any problems, questions or concerns. No barriers to learning was detected. The total time spent in the appointment was 40 minutes.     Truitt Merle, MD 02/01/2021   I, Joslyn Devon, am acting as scribe for Truitt Merle, MD.   I have reviewed the above documentation for accuracy and completeness, and I agree with the above.    Addendum Her Doppler today showed bilateral DVT, more extensive in the left involving femoral vein, popliteal vein etc. we will stop her Megace.  I called in Xarelto starting pack, she was started 50 mg twice daily for 3 weeks, then changed to 20 mg daily.  My nurse called her daughter, she knows to watch for signs of bleeding.  Truitt Merle  02/01/2021

## 2021-01-30 ENCOUNTER — Other Ambulatory Visit (HOSPITAL_COMMUNITY): Payer: Self-pay

## 2021-01-31 ENCOUNTER — Other Ambulatory Visit: Payer: Self-pay

## 2021-01-31 ENCOUNTER — Encounter: Payer: Self-pay | Admitting: Rehabilitation

## 2021-01-31 ENCOUNTER — Ambulatory Visit: Payer: Medicare Other | Attending: Hematology | Admitting: Rehabilitation

## 2021-01-31 DIAGNOSIS — R2689 Other abnormalities of gait and mobility: Secondary | ICD-10-CM | POA: Diagnosis present

## 2021-01-31 NOTE — Addendum Note (Signed)
Addended by: Stark Bray on: 01/31/2021 04:10 PM   Modules accepted: Orders

## 2021-01-31 NOTE — Therapy (Signed)
[place], [place], [address] Phone: [phone]   Fax:  [phone]  Physical Therapy Evaluation  Patient Details  Name: Pamela Kennedy MRN: [id number] Date of Birth: 05-24-1956 Referring Provider (PT): Dr. Burr Medico   Encounter Date: 01/31/2021   PT End of Session - 01/31/21 1557    Visit Number 1    Number of Visits 13    Date for PT Re-Evaluation 03/14/21    Authorization Type MCR/MCD    PT Start Time 1407    PT Stop Time 1455    PT Time Calculation (min) 48 min    Activity Tolerance Patient tolerated treatment well    Behavior During Therapy [facility] for tasks assessed/performed           Past Medical History:  Diagnosis Date  . Depression   . Diabetes mellitus without complication ([place])   . Family history of breast cancer   . Family history of prostate cancer   . Overactive bladder     Past Surgical History:  Procedure Laterality Date  . CHOLECYSTECTOMY      There were no vitals filed for this visit.    Subjective Assessment - 01/31/21 1408    Subjective per sister trouble with mobility ever since surgery.    Pertinent History History of gallbladder surgery for cancer in 2021 with possible stroke occurring.  Rehab after the surgery x 3 weeks then returning to home with sister.  DM    Limitations Walking    Patient Stated Goals per sister: walk better    Currently in Pain? No/denies              [name] PT Assessment - 01/31/21 0001      Assessment   Medical Diagnosis CVA, gallbladder cancer    Referring Provider (PT) Dr. Burr Medico    Onset Date/Surgical Date 09/20/20    Hand Dominance Right    Prior Therapy rehab only post surgery      Precautions   Precautions Fall      Restrictions   Weight Bearing Restrictions No      Balance Screen   Has the patient fallen in the past 6 months Yes    How many times? 6    Has the patient had a decrease in activity level because of a  fear of falling?  Yes    Is the patient reluctant to leave their home because of a fear of falling?  Yes      [place] Private residence    Living Arrangements Other relatives   2 sisters   Available Help at Discharge [place] Two level    Alternate Level Stairs-Rails Right    Additional Comments uses no AD, has a RW      Prior Function   Level of Independence Needs assistance with ADLs;Needs assistance with homemaking;Needs assistance with gait      Cognition   Overall Cognitive Status Difficult to assess    Memory Impaired      Observation/Other Assessments   Observations pt drowsy in seated but responsive and answering all questions, sister helping with historical answers      Sensation   Additional Comments no numbness or tingling      Posture/Postural Control   Posture/Postural Control Postural limitations    Postural Limitations Rounded Shoulders;Forward head;Increased thoracic kyphosis      ROM / Strength   AROM / PROM / Strength Strength  Strength   Overall Strength Comments WNL with ADLs not tested today    Strength Assessment Site Hand    Right/Left hand Right;Left    Right Hand Grip (lbs) 10    Left Hand Grip (lbs) 7      Transfers   Five time sit to stand comments  20.03 seconds   pt reports easy - time showing recurrent fall prediction     Ambulation/Gait   Ambulation/Gait Yes    Ambulation/Gait Assistance 7: Independent    Ambulation Distance (Feet) 30 Feet    Assistive device None    Gait Pattern Decreased arm swing - right;Decreased arm swing - left    Gait velocity .33m/s   limited community ambulator     Standardized Balance Assessment   Standardized Balance Assessment Berg Balance Test      Berg Balance Test   Sit to Stand Able to stand without using hands and stabilize independently    Standing Unsupported Able to stand safely 2 minutes    Sitting with Back Unsupported but Feet Supported on Floor  or Stool Able to sit safely and securely 2 minutes    Stand to Sit Controls descent by using hands    Transfers Able to transfer safely, definite need of hands    Standing Unsupported with Eyes Closed Able to stand 10 seconds with supervision    Standing Unsupported with Feet Together Able to place feet together independently and stand for 1 minute with supervision    From Standing, Reach Forward with Outstretched Arm Can reach confidently >25 cm (10")    From Standing Position, Pick up Object from Floor Able to pick up shoe safely and easily    From Standing Position, Turn to Look Behind Over each Shoulder Looks behind one side only/other side shows less weight shift   left a bit harder   Turn 360 Degrees Needs close supervision or verbal cueing   to the left 15seconds; Rt: 8seconds   Standing Unsupported, Alternately Place Feet on Step/Stool Able to stand independently and complete 8 steps >20 seconds   15 seconds   Standing Unsupported, One Foot in [employer] balance while stepping or standing    Standing on One Leg Tries to lift leg/unable to hold 3 seconds but remains standing independently   both less than 1 second   Total Score 40    Berg comment: almost 100% risk of falls                      Objective measurements completed on examination: See above findings.               PT Education - 01/31/21 1557    Education Details POC    Person(s) Educated Patient;Other (comment)   sister   Methods Explanation    Comprehension Verbalized understanding               PT Long Term Goals - 01/31/21 1606      PT LONG TERM GOAL #1   Title Pt will be ind with final HEP for continued strength and mobility    Time 6    Period Weeks    Status New      PT LONG TERM GOAL #2   Title Pt will improve BERG by at least 5 points to demonstrate improved balance    Baseline 40    Time 6    Period Weeks    Status New  PT LONG TERM GOAL #3   Title Pt will amb  with LRAD safely    Time 6    Period Weeks    Status New      PT LONG TERM GOAL #4   Title Pt will be educated on fall risk strategies for home    Time 6    Period Weeks    Status New                  Plan - 01/31/21 1558    Clinical Impression Statement Pt presents about 4 months post surgery for gall bladder cancer with 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  Pt is currently on oral chemotherapy for gallbladder cancer with nodal involvement but clear margins and no mets.  In line with thalamic stroke pt exhibits memory, attention, and processing difficulties as does have more trouble with tasks involving movement to the left like turning or stepping.  Pt has had 7 falls in the past 4 months and now lives with her 2 sisters who are helping care for her.  Pt demonstrates high fall risk with TUG, gait speed, sit to stand, and berg balance testing.  Pt is able to perform all tasks but slowly and difficulty with single leg activites or activities to the left.  Pt current uses no AD but has RW and SPC at home which sister is going to start using.  Pt is agreeable to working with PT to improve moblity and balance.    Personal Factors and Comorbidities Age;Behavior Pattern;Fitness;Transportation    Examination-Activity Limitations Locomotion Level;Stairs    Examination-Participation Restrictions Meal Prep;Cleaning;Community Activity;Driving;Shop    Stability/Clinical Decision Making Stable/Uncomplicated    Clinical Decision Making Low    Rehab Potential Fair    PT Frequency 2x / week    PT Duration 6 weeks    PT Treatment/Interventions ADLs/Self Care Home Management;Therapeutic exercise;Gait training;Therapeutic activities;Patient/family education    PT Next Visit Plan (goes by [facility]) using RW or SPC? start AD training/gait, general balance and strength similar to OTAGO, include activities to the left, MMT if needed    Consulted and Agree with Plan of Care Patient;Family member/caregiver     Family Member Consulted sister           Patient will benefit from skilled therapeutic intervention in order to improve the following deficits and impairments:  Abnormal gait,Decreased knowledge of use of DME,Decreased coordination,Difficulty walking  Visit Diagnosis: Other abnormalities of gait and mobility     Problem List Patient Active Problem List   Diagnosis Date Noted  . Genetic testing 11/25/2020  . Family history of breast cancer   . Family history of prostate cancer   . Primary gall bladder adenocarcinoma ([place]) 11/03/2020    Stark Bray 01/31/2021, 4:08 PM  [place], [place], [address] Phone: [phone]   Fax:  [phone]  Name: Malayla Granberry MRN: [id number] Date of Birth: 02/10/1956

## 2021-02-01 ENCOUNTER — Telehealth: Payer: Self-pay

## 2021-02-01 ENCOUNTER — Other Ambulatory Visit (HOSPITAL_COMMUNITY): Payer: Self-pay

## 2021-02-01 ENCOUNTER — Encounter: Payer: Self-pay | Admitting: Hematology

## 2021-02-01 ENCOUNTER — Inpatient Hospital Stay: Payer: Medicare Other

## 2021-02-01 ENCOUNTER — Ambulatory Visit (HOSPITAL_BASED_OUTPATIENT_CLINIC_OR_DEPARTMENT_OTHER)
Admission: RE | Admit: 2021-02-01 | Discharge: 2021-02-01 | Disposition: A | Discharge status: Home / Self Care | Payer: Medicare Other | Source: Ambulatory Visit | Attending: Hematology | Admitting: Hematology

## 2021-02-01 ENCOUNTER — Inpatient Hospital Stay (HOSPITAL_BASED_OUTPATIENT_CLINIC_OR_DEPARTMENT_OTHER): Payer: Medicare Other | Admitting: Hematology

## 2021-02-01 ENCOUNTER — Telehealth: Payer: Self-pay | Admitting: Hematology

## 2021-02-01 ENCOUNTER — Inpatient Hospital Stay: Payer: Medicare Other | Admitting: Nutrition

## 2021-02-01 VITALS — BP 124/75 | HR 123 | Temp 97.7°F | Resp 20 | Ht 64.0 in | Wt 186.9 lb

## 2021-02-01 DIAGNOSIS — C23 Malignant neoplasm of gallbladder: Secondary | ICD-10-CM

## 2021-02-01 DIAGNOSIS — I82403 Acute embolism and thrombosis of unspecified deep veins of lower extremity, bilateral: Secondary | ICD-10-CM

## 2021-02-01 DIAGNOSIS — R609 Edema, unspecified: Secondary | ICD-10-CM

## 2021-02-01 LAB — CBC WITH DIFFERENTIAL (CANCER CENTER ONLY)
Abs Immature Granulocytes: 0.04 10*3/uL (ref 0.00–0.07)
Basophils Absolute: 0 10*3/uL (ref 0.0–0.1)
Basophils Relative: 0 %
Eosinophils Absolute: 0.1 10*3/uL (ref 0.0–0.5)
Eosinophils Relative: 1 %
HCT: 33.1 % — ABNORMAL LOW (ref 36.0–46.0)
Hemoglobin: 10.2 g/dL — ABNORMAL LOW (ref 12.0–15.0)
Immature Granulocytes: 1 %
Lymphocytes Relative: 19 %
Lymphs Abs: 1.5 10*3/uL (ref 0.7–4.0)
MCH: 21.3 pg — ABNORMAL LOW (ref 26.0–34.0)
MCHC: 30.8 g/dL (ref 30.0–36.0)
MCV: 69.1 fL — ABNORMAL LOW (ref 80.0–100.0)
Monocytes Absolute: 0.2 10*3/uL (ref 0.1–1.0)
Monocytes Relative: 3 %
Neutro Abs: 6.2 10*3/uL (ref 1.7–7.7)
Neutrophils Relative %: 76 %
Platelet Count: 244 10*3/uL (ref 150–400)
RBC: 4.79 MIL/uL (ref 3.87–5.11)
RDW: 27 % — ABNORMAL HIGH (ref 11.5–15.5)
WBC Count: 8 10*3/uL (ref 4.0–10.5)
nRBC: 0 % (ref 0.0–0.2)

## 2021-02-01 LAB — CMP (CANCER CENTER ONLY)
ALT: 7 U/L (ref 0–44)
AST: 10 U/L — ABNORMAL LOW (ref 15–41)
Albumin: 3.5 g/dL (ref 3.5–5.0)
Alkaline Phosphatase: 98 U/L (ref 38–126)
Anion gap: 15 (ref 5–15)
BUN: 11 mg/dL (ref 8–23)
CO2: 21 mmol/L — ABNORMAL LOW (ref 22–32)
Calcium: 9.3 mg/dL (ref 8.9–10.3)
Chloride: 107 mmol/L (ref 98–111)
Creatinine: 0.9 mg/dL (ref 0.44–1.00)
GFR, Estimated: >60 mL/min (ref >60)
Glucose, Bld: 184 mg/dL — ABNORMAL HIGH (ref 70–99)
Potassium: 3.6 mmol/L (ref 3.5–5.1)
Sodium: 143 mmol/L (ref 135–145)
Total Bilirubin: 0.7 mg/dL (ref 0.3–1.2)
Total Protein: 7 g/dL (ref 6.5–8.1)

## 2021-02-01 MED ORDER — RIVAROXABAN (XARELTO) VTE STARTER PACK (15 & 20 MG)
ORAL_TABLET | ORAL | 0 refills | Status: DC
  Filled 2021-02-01: qty 51, 30d supply, fill #0

## 2021-02-01 MED ORDER — CAPECITABINE 500 MG PO TABS
ORAL_TABLET | ORAL | 0 refills | Status: DC
  Filled 2021-02-01: qty 84, fill #0
  Filled 2021-02-25: qty 84, 30d supply, fill #0

## 2021-02-01 NOTE — Telephone Encounter (Signed)
Scheduled follow-up appointment per 4/27 los. Patient is aware. 

## 2021-02-01 NOTE — Addendum Note (Signed)
Addended by: Truitt Merle on: 02/01/2021 02:55 PM   Modules accepted: Orders

## 2021-02-01 NOTE — Progress Notes (Signed)
Nutrition follow-up completed with patient and her daughter.   Weight improved and documented as 186.9 pounds on April 27.  This is increased from 181.3 pounds April 6. Noted glucose 184. Patient has lower extremity swelling.  Daughter reports patient will be evaluated for a blood clot. Patient reports nausea has improved.  Constipation is controlled.  She feels she is eating more now.  She enjoys [employer] 1.2 glucose support and is requesting additional samples.  Reports she drinks a lot of water.  Nutrition diagnosis: Food and nutrition related knowledge deficit improved.  Intervention: Stressed importance of continuing smaller more frequent meals and snacks. Continue to avoid concentrated sweets. Continue [employer] 1.2 glucose support shake or Glucerna 1-2 cartons daily. Questions were answered.  Teach back method used.  Monitoring, evaluation, goals: Patient will tolerate adequate calories and protein to minimize weight loss and support adequate glycemic control.  Next visit: To be scheduled as needed.  Patient and family have contact information for questions or concerns.  **Disclaimer: This note was dictated with voice recognition software. Similar sounding words can inadvertently be transcribed and this note may contain transcription errors which may not have been corrected upon publication of note.**

## 2021-02-01 NOTE — Telephone Encounter (Signed)
I left a vm with Minna Merritts, Ms Paolini's daughter regarding: stopping megace, picking up Xarelto at [facility].  I asked that she return my call.  I spoke with Minna Merritts and let her know the above.  She verbalized understanding.  I noticed that her mothers address is now listed here in [place] and not in [place].  I asked which address I should use for home health services.  She stated we should use the [place] address.

## 2021-02-01 NOTE — Progress Notes (Signed)
Bilateral lower extremity venous duplex has been completed. Preliminary results can be found in CV Proc through chart review.  Results were given to Santiago Glad at Dr. Ernestina Penna office.  02/01/21 2:15 PM Carlos Levering RVT

## 2021-02-01 NOTE — Progress Notes (Signed)
Critical Value Vascular ultrasound of bl lower extremeties positive for DVT. Dr Burr Medico notified

## 2021-02-02 ENCOUNTER — Other Ambulatory Visit (HOSPITAL_COMMUNITY): Payer: Self-pay

## 2021-02-02 LAB — CANCER ANTIGEN 19-9: CA 19-9: <2 U/mL (ref 0–35)

## 2021-02-06 ENCOUNTER — Encounter: Payer: Self-pay | Admitting: Hematology

## 2021-02-06 ENCOUNTER — Telehealth: Payer: Self-pay

## 2021-02-06 ENCOUNTER — Other Ambulatory Visit (HOSPITAL_COMMUNITY): Payer: Self-pay

## 2021-02-06 NOTE — Telephone Encounter (Signed)
I spoke to Ms Campise's sister Ms Hiram Comber,  She had left a vm asking about instructions for Xeloda. I returned her call, Per Dr Burr Medico Ms Glab is not to take the Xeloda at this time.  Dr Burr Medico will reevaluate after she is d/c from the hospital.  Ms Hiram Comber verbalized understanding.  I attempted to call Chauncey Mann Ms Willems's daughter.  She did not answer and I was unable to leave a vm.

## 2021-02-07 ENCOUNTER — Encounter: Payer: Self-pay | Admitting: Hematology

## 2021-02-13 ENCOUNTER — Other Ambulatory Visit (HOSPITAL_COMMUNITY): Payer: Self-pay

## 2021-02-14 ENCOUNTER — Encounter: Payer: Medicare Other | Admitting: Rehabilitation

## 2021-02-20 ENCOUNTER — Telehealth: Payer: Self-pay | Admitting: Hematology

## 2021-02-20 NOTE — Telephone Encounter (Signed)
Cancelled appt sper 5/16 sch msg. Spoke to pt's daughter who said pt is in the hospital and they would call back to r/s.

## 2021-02-22 ENCOUNTER — Inpatient Hospital Stay: Payer: Medicare Other

## 2021-02-22 ENCOUNTER — Other Ambulatory Visit (HOSPITAL_COMMUNITY): Payer: Self-pay

## 2021-02-22 ENCOUNTER — Inpatient Hospital Stay: Payer: Medicare Other | Admitting: Hematology

## 2021-02-23 ENCOUNTER — Other Ambulatory Visit (HOSPITAL_COMMUNITY): Payer: Self-pay

## 2021-02-25 ENCOUNTER — Other Ambulatory Visit (HOSPITAL_COMMUNITY): Payer: Self-pay

## 2021-02-26 ENCOUNTER — Other Ambulatory Visit (HOSPITAL_COMMUNITY): Payer: Self-pay

## 2021-03-09 ENCOUNTER — Other Ambulatory Visit (HOSPITAL_COMMUNITY): Payer: Self-pay

## 2021-03-13 ENCOUNTER — Other Ambulatory Visit (HOSPITAL_COMMUNITY): Payer: Self-pay

## 2021-03-22 ENCOUNTER — Telehealth: Payer: Self-pay | Admitting: *Deleted

## 2021-03-22 NOTE — Telephone Encounter (Signed)
FMLA form completion pending clarification of what work coverage needs Seva Janowiak's son Kamden Reber needs.  This nurse unable to reach or leave message.    Connected with daughter Minna Merritts.   "Mom is still admitted to [place], [place].  Currently awake, alert and walking.  My brother Deirdre Evener lives there, works third shift and continues going to [facility] since 02/05/2021 ED visit.  Her two sisters and I live in [place].  Mom does not want to live with me has plans to live with her sister after rehab.  May need Physical Therapy in the home due to trouble finding a skilled nursing facility to accept her here."  02/05/2021 used for start date of "Intermittent FMLA" with 12 month estimated end date."

## 2021-03-24 ENCOUNTER — Telehealth: Payer: Self-pay | Admitting: Hematology

## 2021-03-24 NOTE — Telephone Encounter (Signed)
Rescheduled missed appointment per 6/17 schedule message. Patient's daughter is aware.

## 2021-03-28 ENCOUNTER — Other Ambulatory Visit (HOSPITAL_COMMUNITY): Payer: Self-pay

## 2021-04-05 ENCOUNTER — Other Ambulatory Visit (HOSPITAL_COMMUNITY): Payer: Self-pay

## 2021-04-06 ENCOUNTER — Other Ambulatory Visit (HOSPITAL_COMMUNITY): Payer: Self-pay

## 2021-04-06 ENCOUNTER — Encounter: Payer: Self-pay | Admitting: Hematology

## 2021-04-06 ENCOUNTER — Inpatient Hospital Stay: Payer: Medicare Other | Attending: Hematology

## 2021-04-06 ENCOUNTER — Other Ambulatory Visit: Payer: Self-pay

## 2021-04-06 ENCOUNTER — Inpatient Hospital Stay (HOSPITAL_BASED_OUTPATIENT_CLINIC_OR_DEPARTMENT_OTHER): Payer: Medicare Other | Admitting: Hematology

## 2021-04-06 VITALS — BP 119/76 | HR 105 | Temp 97.5°F | Resp 18 | Ht 64.0 in | Wt 185.0 lb

## 2021-04-06 DIAGNOSIS — Z7901 Long term (current) use of anticoagulants: Secondary | ICD-10-CM | POA: Insufficient documentation

## 2021-04-06 DIAGNOSIS — Z9049 Acquired absence of other specified parts of digestive tract: Secondary | ICD-10-CM | POA: Insufficient documentation

## 2021-04-06 DIAGNOSIS — C23 Malignant neoplasm of gallbladder: Secondary | ICD-10-CM

## 2021-04-06 DIAGNOSIS — E1165 Type 2 diabetes mellitus with hyperglycemia: Secondary | ICD-10-CM | POA: Insufficient documentation

## 2021-04-06 DIAGNOSIS — Z8509 Personal history of malignant neoplasm of other digestive organs: Secondary | ICD-10-CM | POA: Diagnosis present

## 2021-04-06 DIAGNOSIS — Z9221 Personal history of antineoplastic chemotherapy: Secondary | ICD-10-CM | POA: Diagnosis not present

## 2021-04-06 DIAGNOSIS — D649 Anemia, unspecified: Secondary | ICD-10-CM | POA: Insufficient documentation

## 2021-04-06 DIAGNOSIS — Z7982 Long term (current) use of aspirin: Secondary | ICD-10-CM | POA: Diagnosis not present

## 2021-04-06 DIAGNOSIS — Z7984 Long term (current) use of oral hypoglycemic drugs: Secondary | ICD-10-CM | POA: Insufficient documentation

## 2021-04-06 DIAGNOSIS — Z79899 Other long term (current) drug therapy: Secondary | ICD-10-CM | POA: Insufficient documentation

## 2021-04-06 DIAGNOSIS — Z86718 Personal history of other venous thrombosis and embolism: Secondary | ICD-10-CM | POA: Insufficient documentation

## 2021-04-06 LAB — CBC WITH DIFFERENTIAL (CANCER CENTER ONLY)
Abs Immature Granulocytes: 0.04 10*3/uL (ref 0.00–0.07)
Basophils Absolute: 0.1 10*3/uL (ref 0.0–0.1)
Basophils Relative: 1 %
Eosinophils Absolute: 0.1 10*3/uL (ref 0.0–0.5)
Eosinophils Relative: 1 %
HCT: 30.3 % — ABNORMAL LOW (ref 36.0–46.0)
Hemoglobin: 8.9 g/dL — ABNORMAL LOW (ref 12.0–15.0)
Immature Granulocytes: 1 %
Lymphocytes Relative: 29 %
Lymphs Abs: 2.3 10*3/uL (ref 0.7–4.0)
MCH: 22.3 pg — ABNORMAL LOW (ref 26.0–34.0)
MCHC: 29.4 g/dL — ABNORMAL LOW (ref 30.0–36.0)
MCV: 75.8 fL — ABNORMAL LOW (ref 80.0–100.0)
Monocytes Absolute: 0.6 10*3/uL (ref 0.1–1.0)
Monocytes Relative: 8 %
Neutro Abs: 5 10*3/uL (ref 1.7–7.7)
Neutrophils Relative %: 60 %
Platelet Count: 338 10*3/uL (ref 150–400)
RBC: 4 MIL/uL (ref 3.87–5.11)
RDW: 18.1 % — ABNORMAL HIGH (ref 11.5–15.5)
WBC Count: 8.1 10*3/uL (ref 4.0–10.5)
nRBC: 0 % (ref 0.0–0.2)

## 2021-04-06 LAB — CMP (CANCER CENTER ONLY)
ALT: 8 U/L (ref 0–44)
AST: 9 U/L — ABNORMAL LOW (ref 15–41)
Albumin: 2.5 g/dL — ABNORMAL LOW (ref 3.5–5.0)
Alkaline Phosphatase: 113 U/L (ref 38–126)
Anion gap: 11 (ref 5–15)
BUN: 7 mg/dL — ABNORMAL LOW (ref 8–23)
CO2: 19 mmol/L — ABNORMAL LOW (ref 22–32)
Calcium: 8.3 mg/dL — ABNORMAL LOW (ref 8.9–10.3)
Chloride: 111 mmol/L (ref 98–111)
Creatinine: 0.63 mg/dL (ref 0.44–1.00)
GFR, Estimated: >60 mL/min (ref >60)
Glucose, Bld: 114 mg/dL — ABNORMAL HIGH (ref 70–99)
Potassium: 3.9 mmol/L (ref 3.5–5.1)
Sodium: 141 mmol/L (ref 135–145)
Total Bilirubin: <0.2 mg/dL — ABNORMAL LOW (ref 0.3–1.2)
Total Protein: 5.4 g/dL — ABNORMAL LOW (ref 6.5–8.1)

## 2021-04-06 MED ORDER — FUROSEMIDE 20 MG PO TABS
20.0000 mg | ORAL_TABLET | Freq: Every day | ORAL | 0 refills | Status: DC
  Filled 2021-04-06: qty 20, 20d supply, fill #0

## 2021-04-06 MED ORDER — POTASSIUM CHLORIDE CRYS ER 20 MEQ PO TBCR
20.0000 meq | EXTENDED_RELEASE_TABLET | Freq: Every day | ORAL | 0 refills | Status: DC
  Filled 2021-04-06: qty 20, 20d supply, fill #0

## 2021-04-06 NOTE — Progress Notes (Signed)
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 Gallbladder, Cholecystectomy:  -Invasive adenocarcinoma, moderately to poorly-differentiated, infiltrating into the perimuscular fibroconnective tissue.  -Multifocal perineural invasion identified.  -The cystic duct margin is negative for carcinoma.  -Gallbladder with acute necrotizing cholecystitis and cholelithiasis.     08/17/2020 Imaging   MRI Abdomen at [facility]  IMPRESSION -Limited evaluation secondary to motion and use of motion insensitive sequences with suboptimal arterial phase timing.   -Multiloculated collection within the gallbladder fossa with subtle peripheral enhancement may represent a postoperative seroma versus biloma. An abscess is thought to be unlikely.   -Prominent portacaval nodes, indeterminate and possibly reactive.   -Asymmetric left breast tissue likely fibroglandular tissue. Recommend  correlation with mammogram     08/25/2020 Imaging   CT Chest 08/25/20 at [facility] IMPRESSION:   No evidence of intrathoracic  metastatic disease.   Focal nodular asymmetries in the left breast requiring follow-up to exclude neoplastic etiology.   Recent cholecystectomy with slight inflammatory stranding at the gallbladder fossa.   Probable thyroid goiter with mild enlargement and hypodense lesions.   09/20/2020 Cancer Staging   Staging form: Gallbladder, AJCC 8th Edition - Pathologic stage from 09/20/2020: Stage IIIB (pT3, pN1, cM0) - Signed by Truitt Merle, MD on 11/03/2020    09/20/2020 Surgery   Surgery of liver and abdomen by Dr Olen Pel at [facility]    09/20/2020 Pathology Results    Final Diagnosis   A: Lymph nodes, common and proper hepatic, lymphadenectomy: -One of seven lymph nodes, positive for adenocarcinoma. (1/7) -The lymph nodes show nonnecrotizing granulomatous reaction.   B: Lymph nodes, hepatic duodenal, lymphadenectomy: -One of two lymph nodes, positive for adenocarcinoma. (1/2)   C: Cystic duct, biopsy: -Bile duct, negative for carcinoma.   D: Liver, partial hepatectomy (gallbladder fossa): -Adenocarcinoma, poorly-differentiated, 1.0 cm in size,  infiltrating liver. -The cauterized liver parenchymal resection margin is negative for carcinoma.  (See comment)   11/03/2020 Initial Diagnosis   Primary gall bladder adenocarcinoma ([place])    11/08/2020 Tumor Marker   CA 19-9 - less than 2   11/14/2020 -  Chemotherapy   Xeloda $Remov'2000mg'yygItc$  Bid for day 1-14 every 21 days starting on 11/14/20    11/25/2020 Genetic Testing   Negative genetic testing:  No pathogenic variants detected on the [facility] Multi-Cancer + RNA panel. The report date is 11/25/2020.   The Multi-Cancer + RNA Panel offered by [facility] includes sequencing and/or deletion/duplication analysis of the following 84 genes:  AIP*, ALK, APC*, ATM*, AXIN2*, BAP1*, BARD1*, BLM*, BMPR1A*, BRCA1*, BRCA2*, BRIP1*, CASR, CDC73*, CDH1*, CDK4, CDKN1B*, CDKN1C*, CDKN2A, CEBPA, CHEK2*, CTNNA1*, DICER1*, DIS3L2*, EGFR, EPCAM, FH*, FLCN*, GATA2*, GPC3, GREM1,  HOXB13,  HRAS, KIT, MAX*, MEN1*, MET, MITF, MLH1*, MSH2*, MSH3*, MSH6*, MUTYH*, NBN*, NF1*, NF2*, NTHL1*, PALB2*, PDGFRA, PHOX2B, PMS2*, POLD1*, POLE*, POT1*, PRKAR1A*, PTCH1*, PTEN*, RAD50*, RAD51C*, RAD51D*, RB1*, RECQL4, RET, RUNX1*, SDHA*, SDHAF2*, SDHB*, SDHC*, SDHD*, SMAD4*, SMARCA4*, SMARCB1*, SMARCE1*, STK11*, SUFU*, TERC, TERT, TMEM127*, Tp53*, TSC1*, TSC2*, VHL*, WRN*, and WT1.  RNA analysis is performed for * genes.    02/06/2021 Imaging   CT AP with [facility]  Fluid-filled colon. Diarrhea?  No pneumatosis or vein gas identified. No abscess. Patent mesenteric arteries and veins.   Mild bilateral hydronephroureter without obstructing calculus. Urinary bladder distention   02/06/2021 Imaging   US Liver at [facility]  Mildly heterogeneous and mild-to-moderately increased hepatic echotexture suggestive of fatty infiltration of the liver. No evidence of hepatic mass or intrahepatic or extrahepatic biliary ductal dilatation. Nonvisualized gallbladder consistent with prior cholecystectomy. Otherwise unremarkable exam.    02/06/2021 Imaging   MRI Brain at [facility]  CONCLUSION:  1. Mild cortical neuronal volume loss with mild white matter small vessel angiopathy without acute intracranial abnormality.    Addendum:  Stable left posterior lateral parietal lobe 2.1 x 1.2 cm extra-axial  meningioma series 6:16.  The mass is not well demarcated on the FLAIR and T1 weighted series  secondary to patient motion.    02/07/2021 Procedure   Upper Endoscopy at [facility]  IMPRESSION - Normal esophagus.                         - Gastric mucosal atrophy.                         - Normal examined duodenum.                         - No specimens collected      CURRENT THERAPY:  Xeloda $Remov'2000mg'eyFFpq$  Bid for day 1-14 every 21 days starting on 11/14/20. Reduced to $RemoveBe'1500mg'GzBMAVqrx$  BID starting with C4 on 01/23/21. From C5 starting on 5/9, will reduce Xeloda to $RemoveB'1500mg'NtOucvop$  BID 1 week on/1 week off. On hold from 02/06/21.  INTERVAL HISTORY:   Pamela Kennedy is here for a follow up of gallbladder cancer. She was last seen by me on 02/01/21. She presents to the clinic accompanied by her sister. Since her last visit, she was hospitalized for about a month. She notes she is now feeling well and denies pain. Her sister notes she is "getting back to her old self." She got out of rehab last Friday. She still has her PICC line in place.  All other systems were reviewed with the patient and are negative.  MEDICAL HISTORY:  Past Medical History:  Diagnosis Date   Depression    Diabetes mellitus without complication ([place])    Family history of breast cancer    Family history of prostate cancer    Overactive bladder     SURGICAL HISTORY: Past Surgical History:  Procedure Laterality Date   CHOLECYSTECTOMY      I have reviewed the social history and family history with the patient and they are unchanged from previous note.  ALLERGIES:  has No Known Allergies.  MEDICATIONS:  Current Outpatient Medications  Medication Sig Dispense Refill   furosemide (LASIX) 20 MG tablet Take 1 tablet (20 mg total) by mouth daily. 20 tablet 0   potassium chloride SA (KLOR-CON) 20 MEQ tablet Take 1 tablet (20 mEq total) by mouth daily. 20 tablet 0  aspirin EC 81 MG tablet Take 81 mg by mouth daily. Swallow whole.     benztropine (COGENTIN) 0.5 MG tablet Take 0.5 mg by mouth 2 (two) times daily.     capecitabine (XELODA) 500 MG tablet Take 3 tabs twice a day every 12 hours, for 7 days on then 7 days off 84 tablet 0   fesoterodine (TOVIAZ) 8 MG TB24 tablet Take 8 mg by mouth daily.     lovastatin (ALTOPREV) 20 MG 24 hr tablet Take 20 mg by mouth at bedtime.     megestrol (MEGACE ES) 625 MG/5ML suspension Take 5 mLs (625 mg total) by mouth daily. 150 mL 0   metFORMIN (GLUCOPHAGE) 500 MG tablet Take by mouth 2 (two) times daily with a meal.     ondansetron (ZOFRAN) 8 MG tablet TAKE 1 TABLET BY MOUTH EVERY 8 HOURS AS NEEDED FOR NAUSEA OR VOMITING  30 tablet 1   potassium chloride (KLOR-CON) 10 MEQ tablet TAKE 1 TABLET BY MOUTH ONCE A DAY 14 tablet 0   raloxifene (EVISTA) 60 MG tablet Take 60 mg by mouth daily.     risperidone (RISPERDAL) 4 MG tablet Take 4 mg by mouth daily. At bedtime     RIVAROXABAN (XARELTO) VTE STARTER PACK (15 & 20 MG) Follow package directions: Take one 17m tablet by mouth twice a day. On day 22, switch to one 230mtablet once a day. Take with food. 51 each 0   venlafaxine (EFFEXOR) 75 MG tablet Take 75 mg by mouth daily.     venlafaxine XR (EFFEXOR-XR) 150 MG 24 hr capsule Take 150 mg by mouth daily with breakfast.     No current facility-administered medications for this visit.    PHYSICAL EXAMINATION: ECOG PERFORMANCE STATUS: 3 - Symptomatic, >50% confined to bed  Vitals:   04/06/21 1333  BP: 119/76  Pulse: (!) 105  Resp: 18  Temp: (!) 97.5 F (36.4 C)  SpO2: 100%   Filed Weights   04/06/21 1333  Weight: 185 lb (83.9 kg)    Due to COVID19 we will limit examination to appearance. Patient had no complaints.  GENERAL:alert, no distress and comfortable SKIN: skin color normal, no rashes or significant lesions EYES: normal, Conjunctiva are pink and non-injected, sclera clear  NEURO: alert & oriented x 3 with fluent speech  LABORATORY DATA:  I have reviewed the data as listed CBC Latest Ref Rng & Units 04/06/2021 02/01/2021 01/11/2021  WBC 4.0 - 10.5 K/uL 8.1 8.0 6.5  Hemoglobin 12.0 - 15.0 g/dL 8.9(L) 10.2(L) 9.3(L)  Hematocrit 36.0 - 46.0 % 30.3(L) 33.1(L) 31.3(L)  Platelets 150 - 400 K/uL 338 244 259     CMP Latest Ref Rng & Units 04/06/2021 02/01/2021 01/11/2021  Glucose 70 - 99 mg/dL 114(H) 184(H) 154(H)  BUN 8 - 23 mg/dL 7(L) 11 8  Creatinine 0.44 - 1.00 mg/dL 0.63 0.90 0.85  Sodium 135 - 145 mmol/L 141 143 142  Potassium 3.5 - 5.1 mmol/L 3.9 3.6 3.0(L)  Chloride 98 - 111 mmol/L 111 107 102  CO2 22 - 32 mmol/L 19(L) 21(L) 23  Calcium 8.9 - 10.3 mg/dL 8.3(L) 9.3 8.7(L)  Total Protein 6.5 -  8.1 g/dL 5.4(L) 7.0 6.6  Total Bilirubin 0.3 - 1.2 mg/dL <0.2(L) 0.7 0.9  Alkaline Phos 38 - 126 U/L 113 98 111  AST 15 - 41 U/L 9(L) 10(L) 9(L)  ALT 0 - 44 U/L _0 RADIOGRAPHIC STUDIES: I have personally reviewed the  radiological images as listed and agreed with the findings in the report. No results found.   ASSESSMENT & PLAN:  Pamela Kennedy is a 65 y.o. female with   1. Gallbladder cancer cancer, pT3N1M0, stage IIIB -She had stage IIIb gallbladder cancer diagnosed in 06/2020, which was removed completely by 2 surgeries, with negative margins. Last surgery in 09/2020. -I started her on adjuvant Xeloda 1020m/m2 bid day 1-14 every 21 days for 6 months on  11/14/20. Given decreased tolerance, I reduced and postponed her Xeloda to 15054mBID starting 01/23/21. -she was hospitalized for mental status change after cycle 4 chemo, and had a prolonged hospital stay.  She is recovering well now.  - Labs reviewed, Hg 10.2.  Plan to continue to hold the Xeloda for the time being. -Lab and follow-up in 3 weeks, if she improves significantly, will consider restarting Xeloda.   2. Falls, imbalance Lethargy -She was hospitalized in May for lethargy, frequent fall -She also has slurred speak and lethargy. Family is concerned for stroke or this is due to her Psych meds (she has had issues with keeping medications well managed).  -Most recent brain MRI from 03/02/21 showed: no acute intracranial abnormality; chronic lacunar infarct of right thalamus; stable meningioma overlying left sylvian fissure; unchanged presumptive left pituitary adenoma -She is currently living with her sister and gets help from her daughter who lives out of town.  -She was seen by neurologist yesterday -She also has a psychologist -I encouraged her to continue physical therapy.   4. Genetics testing: Her 11/2020 results were negative for pathogenetic mutations.    5. Anemia -Her labs show moderate anemia with low  MCV, MCH. Platelet was mildly elevated in 11/2020.  -Hgb today was low at 8.9 (04/06/21)   6. DM, Hyperglycemia, Depression -She is on metformin for her DM. She is currently trying to find a new PCP. -For her depression, she will f/u with her psychiatrist.    7. Tobacco Use -She uses Nicotine dip which her sister raises concern about. I discussed this can lead to lung, mouth and heart complications and recommended cessation.  -She will f/u with PCP about more treatment options.   8. LE Edema and DVT -I previously prescribed xarelto on 02/01/21. She stopped this during her hospitalization due to GI bleeding. She continues to have b/l leg swelling, so I recommended she restart Xarelto at 20 mg daily     PLAN:  -Hold Xeloda -Restart Xarelto 20 mg daily -Lab and f/u in 3-4 weeks   No problem-specific Assessment & Plan notes found for this encounter.   No orders of the defined types were placed in this encounter.  All questions were answered. The patient knows to call the clinic with any problems, questions or concerns. No barriers to learning was detected. The total time spent in the appointment was 30 minutes.     YaTruitt MerleMD 04/06/2021   I, KaWilburn Mylaram acting as scribe for YaTruitt MerleMD.   I have reviewed the above documentation for accuracy and completeness, and I agree with the above.

## 2021-04-07 LAB — CANCER ANTIGEN 19-9: CA 19-9: <2 U/mL (ref 0–35)

## 2021-04-08 ENCOUNTER — Encounter: Payer: Self-pay | Admitting: Hematology

## 2021-04-10 ENCOUNTER — Other Ambulatory Visit: Payer: Self-pay | Admitting: Hematology

## 2021-04-10 DIAGNOSIS — D649 Anemia, unspecified: Secondary | ICD-10-CM

## 2021-04-11 ENCOUNTER — Other Ambulatory Visit (HOSPITAL_COMMUNITY): Payer: Self-pay

## 2021-04-11 ENCOUNTER — Other Ambulatory Visit: Payer: Self-pay | Admitting: Nurse Practitioner

## 2021-04-11 MED ORDER — RIVAROXABAN 20 MG PO TABS
20.0000 mg | ORAL_TABLET | Freq: Every day | ORAL | 3 refills | Status: DC
  Filled 2021-04-11 – 2021-05-01 (×2): qty 30, 30d supply, fill #0

## 2021-04-13 ENCOUNTER — Telehealth: Payer: Self-pay

## 2021-04-13 ENCOUNTER — Other Ambulatory Visit: Payer: Self-pay | Admitting: *Deleted

## 2021-04-13 ENCOUNTER — Telehealth: Payer: Self-pay | Admitting: Hematology

## 2021-04-13 NOTE — Telephone Encounter (Signed)
I called her daughter Minna Merritts back regarding her rashes on leg. Pt was seen by her primary care physician today and discussed her rash issue, and had some lab test today.  Minna Merritts asked if the rash is related to her previous DVT, which I think unlikely.  She is okay to follow-up her primary care physician's recommendation about rash, and call me back if needed.   Truitt Merle  04/13/2021

## 2021-04-13 NOTE — Telephone Encounter (Signed)
This nurse received call from patients daughter stating mom has a rash on her lower leg that is moving up to her thigh. The rash is described as red flat bumps.  Patient denies  itching, pain, no pustules or weeping noted to leg. Advised patient that information would be forwarded to MD and we will follow up.  No further questions or concerns at this time.

## 2021-04-18 ENCOUNTER — Other Ambulatory Visit (HOSPITAL_COMMUNITY): Payer: Self-pay

## 2021-04-26 ENCOUNTER — Ambulatory Visit: Payer: Medicare Other | Admitting: Physical Therapy

## 2021-04-26 ENCOUNTER — Other Ambulatory Visit (HOSPITAL_COMMUNITY): Payer: Self-pay

## 2021-04-27 ENCOUNTER — Other Ambulatory Visit: Payer: Self-pay

## 2021-04-27 ENCOUNTER — Inpatient Hospital Stay: Payer: Medicare Other

## 2021-04-27 ENCOUNTER — Other Ambulatory Visit (HOSPITAL_COMMUNITY): Payer: Self-pay

## 2021-04-27 ENCOUNTER — Inpatient Hospital Stay: Payer: Medicare Other | Attending: Hematology | Admitting: Hematology

## 2021-04-27 ENCOUNTER — Encounter: Payer: Self-pay | Admitting: Hematology

## 2021-04-27 ENCOUNTER — Telehealth: Payer: Self-pay

## 2021-04-27 ENCOUNTER — Ambulatory Visit: Payer: Medicare Other

## 2021-04-27 VITALS — BP 136/90 | HR 82 | Temp 98.4°F | Resp 19 | Ht 64.0 in | Wt 178.8 lb

## 2021-04-27 DIAGNOSIS — C23 Malignant neoplasm of gallbladder: Secondary | ICD-10-CM

## 2021-04-27 DIAGNOSIS — E1165 Type 2 diabetes mellitus with hyperglycemia: Secondary | ICD-10-CM | POA: Diagnosis not present

## 2021-04-27 DIAGNOSIS — Z720 Tobacco use: Secondary | ICD-10-CM | POA: Diagnosis not present

## 2021-04-27 DIAGNOSIS — Z79899 Other long term (current) drug therapy: Secondary | ICD-10-CM | POA: Insufficient documentation

## 2021-04-27 DIAGNOSIS — Z7984 Long term (current) use of oral hypoglycemic drugs: Secondary | ICD-10-CM | POA: Diagnosis not present

## 2021-04-27 DIAGNOSIS — D649 Anemia, unspecified: Secondary | ICD-10-CM | POA: Diagnosis not present

## 2021-04-27 DIAGNOSIS — Z86718 Personal history of other venous thrombosis and embolism: Secondary | ICD-10-CM | POA: Insufficient documentation

## 2021-04-27 DIAGNOSIS — F32A Depression, unspecified: Secondary | ICD-10-CM | POA: Diagnosis not present

## 2021-04-27 DIAGNOSIS — I82403 Acute embolism and thrombosis of unspecified deep veins of lower extremity, bilateral: Secondary | ICD-10-CM

## 2021-04-27 DIAGNOSIS — R600 Localized edema: Secondary | ICD-10-CM | POA: Diagnosis not present

## 2021-04-27 DIAGNOSIS — Z7982 Long term (current) use of aspirin: Secondary | ICD-10-CM | POA: Insufficient documentation

## 2021-04-27 LAB — CBC WITH DIFFERENTIAL (CANCER CENTER ONLY)
Abs Immature Granulocytes: 0.02 10*3/uL (ref 0.00–0.07)
Basophils Absolute: 0.1 10*3/uL (ref 0.0–0.1)
Basophils Relative: 1 %
Eosinophils Absolute: 0.1 10*3/uL (ref 0.0–0.5)
Eosinophils Relative: 1 %
HCT: 33.6 % — ABNORMAL LOW (ref 36.0–46.0)
Hemoglobin: 9.8 g/dL — ABNORMAL LOW (ref 12.0–15.0)
Immature Granulocytes: 0 %
Lymphocytes Relative: 21 %
Lymphs Abs: 2 10*3/uL (ref 0.7–4.0)
MCH: 20.9 pg — ABNORMAL LOW (ref 26.0–34.0)
MCHC: 29.2 g/dL — ABNORMAL LOW (ref 30.0–36.0)
MCV: 71.5 fL — ABNORMAL LOW (ref 80.0–100.0)
Monocytes Absolute: 0.6 10*3/uL (ref 0.1–1.0)
Monocytes Relative: 6 %
Neutro Abs: 6.8 10*3/uL (ref 1.7–7.7)
Neutrophils Relative %: 71 %
Platelet Count: 409 10*3/uL — ABNORMAL HIGH (ref 150–400)
RBC: 4.7 MIL/uL (ref 3.87–5.11)
RDW: 17.8 % — ABNORMAL HIGH (ref 11.5–15.5)
WBC Count: 9.6 10*3/uL (ref 4.0–10.5)
nRBC: 0 % (ref 0.0–0.2)

## 2021-04-27 LAB — CMP (CANCER CENTER ONLY)
ALT: 12 U/L (ref 0–44)
AST: 19 U/L (ref 15–41)
Albumin: 3.5 g/dL (ref 3.5–5.0)
Alkaline Phosphatase: 102 U/L (ref 38–126)
Anion gap: 9 (ref 5–15)
BUN: 11 mg/dL (ref 8–23)
CO2: 23 mmol/L (ref 22–32)
Calcium: 9.3 mg/dL (ref 8.9–10.3)
Chloride: 108 mmol/L (ref 98–111)
Creatinine: 0.72 mg/dL (ref 0.44–1.00)
GFR, Estimated: >60 mL/min (ref >60)
Glucose, Bld: 171 mg/dL — ABNORMAL HIGH (ref 70–99)
Potassium: 4 mmol/L (ref 3.5–5.1)
Sodium: 140 mmol/L (ref 135–145)
Total Bilirubin: 0.4 mg/dL (ref 0.3–1.2)
Total Protein: 6.6 g/dL (ref 6.5–8.1)

## 2021-04-27 MED ORDER — FUROSEMIDE 20 MG PO TABS
30.0000 mg | ORAL_TABLET | Freq: Every day | ORAL | 0 refills | Status: DC | PRN
  Filled 2021-04-27: qty 30, 20d supply, fill #0

## 2021-04-27 NOTE — Telephone Encounter (Signed)
This nurse scheduled Venous Doppler of BLE related to Edema per Dr. Burr Medico.  Spoke with patients sister Hoyle Sauer and made aware of appt on 7/22 at 1pm.  Acknowledged understanding.  No further questions or concerns at this time.

## 2021-04-27 NOTE — Progress Notes (Signed)
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 Gallbladder, Cholecystectomy:  -Invasive adenocarcinoma, moderately to poorly-differentiated, infiltrating into the perimuscular fibroconnective tissue.  -Multifocal perineural invasion identified.  -The cystic duct margin is negative for carcinoma.  -Gallbladder with acute necrotizing cholecystitis and cholelithiasis.     08/17/2020 Imaging   MRI Abdomen at [facility]  IMPRESSION -Limited evaluation secondary to motion and use of motion insensitive sequences with suboptimal arterial phase timing.   -Multiloculated collection within the gallbladder fossa with subtle peripheral enhancement may represent a postoperative seroma versus biloma. An abscess is thought to be unlikely.   -Prominent portacaval nodes, indeterminate and possibly reactive.   -Asymmetric left breast tissue likely fibroglandular tissue. Recommend  correlation with mammogram     08/25/2020 Imaging   CT Chest 08/25/20 at [facility] IMPRESSION:   No evidence of  intrathoracic metastatic disease.   Focal nodular asymmetries in the left breast requiring follow-up to exclude neoplastic etiology.   Recent cholecystectomy with slight inflammatory stranding at the gallbladder fossa.   Probable thyroid goiter with mild enlargement and hypodense lesions.   09/20/2020 Cancer Staging   Staging form: Gallbladder, AJCC 8th Edition - Pathologic stage from 09/20/2020: Stage IIIB (pT3, pN1, cM0) - Signed by Truitt Merle, MD on 11/03/2020    09/20/2020 Surgery   Surgery of liver and abdomen by Dr Olen Pel at [facility]    09/20/2020 Pathology Results    Final Diagnosis   A: Lymph nodes, common and proper hepatic, lymphadenectomy: -One of seven lymph nodes, positive for adenocarcinoma. (1/7) -The lymph nodes show nonnecrotizing granulomatous reaction.   B: Lymph nodes, hepatic duodenal, lymphadenectomy: -One of two lymph nodes, positive for adenocarcinoma. (1/2)   C: Cystic duct, biopsy: -Bile duct, negative for carcinoma.   D: Liver, partial hepatectomy (gallbladder fossa): -Adenocarcinoma, poorly-differentiated, 1.0 cm in size,  infiltrating liver. -The cauterized liver parenchymal resection margin is negative for carcinoma.  (See comment)   11/03/2020 Initial Diagnosis   Primary gall bladder adenocarcinoma ([place])    11/08/2020 Tumor Marker   CA 19-9 - less than 2   11/14/2020 -  Chemotherapy   Xeloda 2054m Bid for day 1-14 every 21 days starting on 11/14/20    11/25/2020 Genetic Testing   Negative genetic testing:  No pathogenic variants detected on the [facility] Multi-Cancer + RNA panel. The report date is 11/25/2020.   The Multi-Cancer + RNA Panel offered by [facility] includes sequencing and/or deletion/duplication analysis of the following 84 genes:  AIP*, ALK, APC*, ATM*, AXIN2*, BAP1*, BARD1*, BLM*, BMPR1A*, BRCA1*, BRCA2*, BRIP1*, CASR, CDC73*, CDH1*, CDK4, CDKN1B*, CDKN1C*, CDKN2A, CEBPA, CHEK2*, CTNNA1*, DICER1*, DIS3L2*, EGFR, EPCAM, FH*, FLCN*, GATA2*,  GPC3, GREM1,  HOXB13, HRAS, KIT, MAX*, MEN1*, MET, MITF, MLH1*, MSH2*, MSH3*, MSH6*, MUTYH*, NBN*, NF1*, NF2*, NTHL1*, PALB2*, PDGFRA, PHOX2B, PMS2*, POLD1*, POLE*, POT1*, PRKAR1A*, PTCH1*, PTEN*, RAD50*, RAD51C*, RAD51D*, RB1*, RECQL4, RET, RUNX1*, SDHA*, SDHAF2*, SDHB*, SDHC*, SDHD*, SMAD4*, SMARCA4*, SMARCB1*, SMARCE1*, STK11*, SUFU*, TERC, TERT, TMEM127*, Tp53*, TSC1*, TSC2*, VHL*, WRN*, and WT1.  RNA analysis is performed for * genes.    02/06/2021 Imaging   CT AP with [facility]  Fluid-filled colon. Diarrhea?  No pneumatosis or vein gas identified. No abscess. Patent mesenteric arteries and veins.   Mild bilateral hydronephroureter without obstructing calculus. Urinary bladder distention   02/06/2021 Imaging   US Liver at [facility]  Mildly heterogeneous and mild-to-moderately increased hepatic echotexture suggestive of fatty infiltration of the liver. No evidence of hepatic mass or intrahepatic or extrahepatic biliary ductal dilatation. Nonvisualized gallbladder consistent with prior cholecystectomy. Otherwise unremarkable exam.    02/06/2021 Imaging   MRI Brain at [facility]  CONCLUSION:  1. Mild cortical neuronal volume loss with mild white matter small vessel angiopathy without acute intracranial abnormality.    Addendum:  Stable left posterior lateral parietal lobe 2.1 x 1.2 cm extra-axial  meningioma series 6:16.  The mass is not well demarcated on the FLAIR and T1 weighted series  secondary to patient motion.    02/07/2021 Procedure   Upper Endoscopy at [facility]  IMPRESSION - Normal esophagus.                         - Gastric mucosal atrophy.                         - Normal examined duodenum.                         - No specimens collected      CURRENT THERAPY:  Xeloda 2047m Bid for day 1-14 every 21 days starting on 11/14/20. Reduced to 15044mBID starting with C4 on 01/23/21. s/p 4 cycles. On hold from 02/06/21 due to her hospitalization.  INTERVAL HISTORY:  Pamela Kennedy here  for a follow up of gallbladder cancer. She was last seen by me on 04/06/2021, she presents to clinic with her sister. She was at her own house for the past three weeks, and [place]. Her sisters have been taking turns to live with her  She reports b/l leg edema for 2 weeks, she has restarted Xarelto sicne 3 weeks ago She is doing more at home now, able to do ADLs and house work  NO pain or GI symptoms  Eating better but still lost 7 lbs in past 3 weeks  She is on oral iron pill once daily   All other systems were reviewed with the patient and are negative.  MEDICAL HISTORY:  Past Medical History:  Diagnosis Date   Depression    Diabetes mellitus without complication ([place]   Family history of breast cancer    Family history of prostate cancer    Overactive bladder     SURGICAL HISTORY: Past Surgical History:  Procedure Laterality Date   CHOLECYSTECTOMY      I have reviewed the social history and family history with the patient and they are unchanged from previous note.  ALLERGIES:  has No Known Allergies.  MEDICATIONS:  Current Outpatient Medications  Medication Sig Dispense Refill   aspirin EC 81 MG tablet Take 81 mg by mouth  daily. Swallow whole.     benztropine (COGENTIN) 0.5 MG tablet Take 0.5 mg by mouth 2 (two) times daily.     capecitabine (XELODA) 500 MG tablet Take 3 tabs twice a day every 12 hours, for 7 days on then 7 days off 84 tablet 0   fesoterodine (TOVIAZ) 8 MG TB24 tablet Take 8 mg by mouth daily.     furosemide (LASIX) 20 MG tablet Take 1 & 1/2 tablets (30 mg total) by mouth daily as needed. 30 tablet 0   lovastatin (ALTOPREV) 20 MG 24 hr tablet Take 20 mg by mouth at bedtime.     megestrol (MEGACE ES) 625 MG/5ML suspension Take 5 mLs (625 mg total) by mouth daily. 150 mL 0   metFORMIN (GLUCOPHAGE) 500 MG tablet Take by mouth 2 (two) times daily with a meal.     ondansetron (ZOFRAN) 8 MG tablet TAKE 1 TABLET BY MOUTH EVERY 8 HOURS AS  NEEDED FOR NAUSEA OR VOMITING 30 tablet 1   potassium chloride (KLOR-CON) 10 MEQ tablet TAKE 1 TABLET BY MOUTH ONCE A DAY 14 tablet 0   potassium chloride SA (KLOR-CON) 20 MEQ tablet Take 1 tablet (20 mEq total) by mouth daily. 20 tablet 0   raloxifene (EVISTA) 60 MG tablet Take 60 mg by mouth daily.     risperidone (RISPERDAL) 4 MG tablet Take 4 mg by mouth daily. At bedtime     rivaroxaban (XARELTO) 20 MG TABS tablet Take 1 tablet (20 mg total) by mouth daily with supper. 30 tablet 3   venlafaxine (EFFEXOR) 75 MG tablet Take 75 mg by mouth daily.     venlafaxine XR (EFFEXOR-XR) 150 MG 24 hr capsule Take 150 mg by mouth daily with breakfast.     No current facility-administered medications for this visit.    PHYSICAL EXAMINATION: ECOG PERFORMANCE STATUS: 2  Vitals:   04/27/21 1143  BP: 136/90  Pulse: 82  Resp: 19  Temp: 98.4 F (36.9 C)  SpO2: 100%    Filed Weights   04/27/21 1143  Weight: 178 lb 12.8 oz (81.1 kg)     Due to COVID19 we will limit examination to appearance. Patient had no complaints.  GENERAL:alert, no distress and comfortable SKIN: skin color normal, no rashes or significant lesions EYES: normal, Conjunctiva are pink and non-injected, sclera clear  NEURO: alert & oriented x 3 with fluent speech  LABORATORY DATA:  I have reviewed the data as listed CBC Latest Ref Rng & Units 04/27/2021 04/06/2021 02/01/2021  WBC 4.0 - 10.5 K/uL 9.6 8.1 8.0  Hemoglobin 12.0 - 15.0 g/dL 9.8(L) 8.9(L) 10.2(L)  Hematocrit 36.0 - 46.0 % 33.6(L) 30.3(L) 33.1(L)  Platelets 150 - 400 K/uL 409(H) 338 244     CMP Latest Ref Rng & Units 04/27/2021 04/06/2021 02/01/2021  Glucose 70 - 99 mg/dL 171(H) 114(H) 184(H)  BUN 8 - 23 mg/dL 11 7(L) 11  Creatinine 0.44 - 1.00 mg/dL 0.72 0.63 0.90  Sodium 135 - 145 mmol/L 140 141 143  Potassium 3.5 - 5.1 mmol/L 4.0 3.9 3.6  Chloride 98 - 111 mmol/L 108 111 107  CO2 22 - 32 mmol/L 23 19(L) 21(L)  Calcium 8.9 - 10.3 mg/dL 9.3 8.3(L) 9.3   Total Protein 6.5 - 8.1 g/dL 6.6 5.4(L) 7.0  Total Bilirubin 0.3 - 1.2 mg/dL 0.4 <0.2(L) 0.7  Alkaline Phos 38 - 126 U/L 102 113 98  AST 15 - 41 U/L 19 9(L) 10(L)  ALT 0 - 44 U/L 12 8 7  RADIOGRAPHIC STUDIES: I have personally reviewed the radiological images as listed and agreed with the findings in the report. No results found.   ASSESSMENT & PLAN:  France Lusty is a 65 y.o. female with   1. Gallbladder cancer cancer, pT3N1M0, stage IIIB -She had stage IIIb gallbladder cancer diagnosed in 06/2020, which was removed completely by 2 surgeries, with negative margins. Last surgery in 09/2020. -I started her on adjuvant Xeloda 1079m/m2 bid day 1-14 every 21 days for 6 months on  11/14/20. Given decreased tolerance, I reduced and postponed her Xeloda to 1503mBID starting cycle 4 on 01/23/21. -she was hospitalized for mental status change after cycle 4 chemo, and had a prolonged hospital stay.  She is recovering well now.  -Although she has recovered well, she still appears to be frail. Her sister is also concerned about her tolerance to more chemotherapy.  I plan to stop her adjuvant chemotherapy, and proceed with cancer surveillance. -We will repeat surveillant CT scan next month before follow-up.   2. Falls, imbalance Lethargy -She was hospitalized in May for lethargy, frequent fall -She also has slurred speak and lethargy. Family is concerned for stroke or this is due to her Psych meds (she has had issues with keeping medications well managed).  -Most recent brain MRI from 03/02/21 showed: no acute intracranial abnormality; chronic lacunar infarct of right thalamus; stable meningioma overlying left sylvian fissure; unchanged presumptive left pituitary adenoma -She is currently living with her sister and gets help from her daughter who lives out of town.  -She was seen by neurologist  -She also has a psychologist -I encouraged her to continue physical therapy. -she has  improved overall    4. Genetics testing: Her 11/2020 results were negative for pathogenetic mutations.    5. Anemia -Her labs show moderate anemia with low MCV, MCH. Platelet was mildly elevated in 11/2020.  -improved    6. DM, Hyperglycemia, Depression -She is on metformin for her DM. She is currently trying to find a new PCP. -For her depression, she will f/u with her psychiatrist.    7. Tobacco Use -She uses Nicotine dip which her sister raises concern about. I discussed this can lead to lung, mouth and heart complications and recommended cessation.  -She will f/u with PCP about more treatment options.   8. LE Edema and DVT -I previously prescribed xarelto on 02/01/21. She stopped this during her hospitalization due to GI bleeding. She continues to have b/l leg swelling, so I recommended she restart Xarelto at 20 mg daily on 03/27/21 -Due to persistent bilateral lower extremity edema, I will repeat a Doppler to rule out DVT     PLAN:  -will not restart Xeloda  -I reviewed her Lasix, and encouraged her to use compression stocks for leg edema -Doppler of bilateral lower extremity tomorrow, to ruled out new DVT -continue Xarelto 20 mg daily -Follow-up in a month with lab and CT chest, abdomen pelvis with contrast a few days before   No problem-specific Assessment & Plan notes found for this encounter.   Orders Placed This Encounter  Procedures   CT CHEST ABDOMEN PELVIS W CONTRAST    Standing Status:   Future    Standing Expiration Date:   04/27/2022    Order Specific Question:   If indicated for the ordered procedure, I authorize the administration of contrast media per Radiology protocol    Answer:   Yes    Order Specific Question:   Preferred imaging location?  Answer:   [facility]    Order Specific Question:   Release to patient    Answer:   Immediate    Order Specific Question:   Is Oral Contrast requested for this exam?    Answer:   Yes, Per Radiology protocol     Order Specific Question:   Reason for Exam (SYMPTOM  OR DIAGNOSIS REQUIRED)    Answer:   Rule out cancer recurrence    All questions were answered. The patient knows to call the clinic with any problems, questions or concerns. No barriers to learning was detected. The total time spent in the appointment was 30 minutes.     Truitt Merle, MD 04/27/2021   I, Wilburn Mylar, am acting as scribe for Truitt Merle, MD.   I have reviewed the above documentation for accuracy and completeness, and I agree with the above.

## 2021-04-28 ENCOUNTER — Telehealth: Payer: Self-pay

## 2021-04-28 ENCOUNTER — Other Ambulatory Visit (HOSPITAL_COMMUNITY): Payer: Self-pay

## 2021-04-28 ENCOUNTER — Ambulatory Visit (HOSPITAL_COMMUNITY)
Admission: RE | Admit: 2021-04-28 | Discharge: 2021-04-28 | Disposition: A | Discharge status: Home / Self Care | Payer: Medicare Other | Source: Ambulatory Visit | Attending: Hematology | Admitting: Hematology

## 2021-04-28 DIAGNOSIS — I82403 Acute embolism and thrombosis of unspecified deep veins of lower extremity, bilateral: Secondary | ICD-10-CM

## 2021-04-28 LAB — FERRITIN: Ferritin: 7 ng/mL — ABNORMAL LOW (ref 11–307)

## 2021-04-28 LAB — IRON AND TIBC
Iron: 20 ug/dL — ABNORMAL LOW (ref 28–170)
Saturation Ratios: 4 % — ABNORMAL LOW (ref 10.4–31.8)
TIBC: 482 ug/dL — ABNORMAL HIGH (ref 250–450)
UIBC: 462 ug/dL

## 2021-04-28 NOTE — Telephone Encounter (Signed)
Greg from [facility]iology called to give report on this patient.  Stated patient is negative for DVT in RLE, however there is residual behind the knee in the popliteal and area.  This nurse informed per Dr. Burr Medico it  is ok to release the patient.  Advise patient to continue taking Xarelto, elevate her legs as much a possible and where compression stockings.  No further questions or concerns at this time.  This nurse will follow up with patient.

## 2021-04-28 NOTE — Progress Notes (Signed)
Bilateral lower extremity venous duplex has been completed. Preliminary results can be found in CV Proc through chart review.  Results were given to Dane at Dr. Ernestina Penna office.  04/28/21 1:16 PM Pamela Kennedy RVT

## 2021-05-01 ENCOUNTER — Other Ambulatory Visit (HOSPITAL_COMMUNITY): Payer: Self-pay

## 2021-05-05 ENCOUNTER — Ambulatory Visit: Payer: Medicare Other | Attending: Hematology | Admitting: Physical Therapy

## 2021-05-05 ENCOUNTER — Other Ambulatory Visit: Payer: Self-pay

## 2021-05-05 DIAGNOSIS — R2689 Other abnormalities of gait and mobility: Secondary | ICD-10-CM | POA: Diagnosis present

## 2021-05-05 DIAGNOSIS — M6281 Muscle weakness (generalized): Secondary | ICD-10-CM | POA: Insufficient documentation

## 2021-05-05 DIAGNOSIS — R296 Repeated falls: Secondary | ICD-10-CM | POA: Diagnosis present

## 2021-05-05 NOTE — Therapy (Signed)
[place], [place], [address] Phone: [phone]   Fax:  [phone]  Physical Therapy Re-Evaluation  Patient Details  Name: Pamela Kennedy MRN: [id number] Date of Birth: April 03, 1956 Referring Provider (PT): Dr. Burr Medico   Encounter Date: 05/05/2021   PT End of Session - 05/05/21 1400     Visit Number 1    Number of Visits 13    Date for PT Re-Evaluation 06/16/21    PT Start Time 0900    PT Stop Time 0945    PT Time Calculation (min) 45 min    Activity Tolerance Patient tolerated treatment well    Behavior During Therapy [facility] for tasks assessed/performed             Past Medical History:  Diagnosis Date   Depression    Diabetes mellitus without complication ([place])    Family history of breast cancer    Family history of prostate cancer    Overactive bladder     Past Surgical History:  Procedure Laterality Date   CHOLECYSTECTOMY      There were no vitals filed for this visit.    Subjective Assessment - 05/05/21 0911     Subjective Daughter states pt was in the hospital the full month of May [facility]) for treatment for swallowing difficulty and has blood clots in both legs.Daughter states she did not get out of bed of much during that time She is better but  she still having trouble getting around at home. She wants to come here to get stronger and have better coordination. Pt wants to strengthen the left leg    Patient is accompained by: Family member   daughter Pamela Kennedy   Pertinent History History of gallbladder surgery for cancer in 2021 with possible stroke occurring.  Rehab after the surgery x 3 weeks then returning to home with sister. Pt had hospitalization in May and is having difficulty recovering her strength. Pt is no longer getting a chemo pill    Limitations Walking;Other (comment)   going up and down stairs   Patient Stated Goals pt wants to go up and down the  stairs by herself    Currently in Pain? No/denies                [facility] PT Assessment - 05/05/21 0001       Assessment   Medical Diagnosis CVA, gallbladder cancer    Referring Provider (PT) Dr. Burr Medico    Onset Date/Surgical Date 09/20/20    Hand Dominance Right    Prior Therapy rehab eval in April but was hospitalized and did not come for PT      Precautions   Precautions Fall      Restrictions   Weight Bearing Restrictions No      Balance Screen   Has the patient fallen in the past 6 months Yes    How many times? > 6 times   fell yesterday going up the steps   Has the patient had a decrease in activity level because of a fear of falling?  No    Is the patient reluctant to leave their home because of a fear of falling?  No      Home Environment   Living Environment Private residence    Living Arrangements Other relatives   2 sisters   Available Help at Discharge Family    Type of Home --   pt lives in [place] in a  mobile home, pt currently staying with sisters in [place] and goes back and forth   Home Layout Two level    Alternate Level Stairs-Rails Right    Additional Comments has a RW that she occasionally uses      Prior Function   Level of Independence Needs assistance with ADLs;Needs assistance with homemaking;Needs assistance with gait      Cognition   Overall Cognitive Status Within Functional Limits for tasks assessed      Observation/Other Assessments   Observations no visible swelling in legs today.  Pt concerned about blood clot in leg and asked about compression stockings. Daughter lives in [place] and will check out [employer] and Passenger transport manager Not tested      Coordination   Gross Motor Movements are Fluid and Coordinated No   gross movments slowed     Functional Tests   Functional tests Sit to Stand      Sit to Stand   Comments 6 reps in 30 seconds from low mat , RPE 6/10 with shortness of breath       Posture/Postural Control   Posture/Postural Control Postural limitations    Postural Limitations Rounded Shoulders;Forward head;Increased thoracic kyphosis      ROM / Strength   AROM / PROM / Strength Strength      Strength   Overall Strength Deficits    Strength Assessment Site Hip;Knee;Ankle;Hand    Right Hand Grip (lbs) 20/20/25    Left Hand Grip (lbs) 15/20/20    Right/Left Hip Right;Left    Right Hip ABduction 3/5   in sidelying   Left Hip ABduction 3/5   in sidelying     Transfers   Five time sit to stand comments  17.38      Ambulation/Gait   Ambulation/Gait Yes    Ambulation/Gait Assistance 6: Modified independent (Device/Increase time)   slow   Ambulation Distance (Feet) 50 Feet    Assistive device None    Gait Pattern Step-through pattern;Decreased arm swing - right;Decreased arm swing - left;Ataxic   slight balance loss with turns   Gait Comments pt appears to unsteady in gait with difficutly changing directions      Standardized Balance Assessment   Standardized Balance Assessment Timed Up and Go Test;Berg Balance Test      Berg Balance Test   Sit to Stand Able to stand without using hands and stabilize independently    Standing Unsupported Able to stand safely 2 minutes    Sitting with Back Unsupported but Feet Supported on Floor or Stool Able to sit safely and securely 2 minutes    Stand to Sit Sits safely with minimal use of hands    Transfers Able to transfer safely, minor use of hands    Standing Unsupported with Eyes Closed Able to stand 10 seconds safely    Standing Unsupported with Feet Together Able to place feet together independently and stand 1 minute safely    From Standing, Reach Forward with Outstretched Arm Can reach confidently >25 cm (10")    From Standing Position, Pick up Object from Floor Unable to try/needs assist to keep balance    From Standing Position, Turn to Look Behind Over each Shoulder Turn sideways only but maintains balance    Turn  360 Degrees Able to turn 360 degrees safely but slowly    Standing Unsupported, Alternately Place Feet on Step/Stool Able to stand independently and safely and complete 8  steps in 20 seconds    Standing Unsupported, One Foot in [place] to plae foot ahead of the other independently and hold 30 seconds    Standing on One Leg Tries to lift leg/unable to hold 3 seconds but remains standing independently      Timed Up and Go Test   TUG Normal TUG    Normal TUG (seconds) 10.87                        Objective measurements completed on examination: See above findings.                 PT Short Term Goals - 05/05/21 1410       PT SHORT TERM GOAL #1   Title Pt will be independent in a beginning home exercise program for leg strength    Time 3    Period Weeks    Status New               PT Long Term Goals - 05/05/21 1410       PT LONG TERM GOAL #1   Title Pt will be ind with final HEP for continued strength and mobility    Time 6    Period Weeks    Status New      PT LONG TERM GOAL #2   Title Pt will improve BERG by at least 46 points to demonstrate improved balance    Baseline 44 on eval    Time 6    Period Weeks    Status New      PT LONG TERM GOAL #3   Title Pt will decrease TUG to < 9.5 seconds demonstrating improved functional moblity    Time 6    Period Weeks    Status New      PT LONG TERM GOAL #4   Title Pt will be educated on fall risk strategies for home    Time 6    Period Weeks    Status New                    Plan - 05/05/21 1401     Clinical Impression Statement Pt was not able to complete previous plan for PT as she was admitted to the hospital for the month of May and experienced more deconditioing along with blood clots in both legs.  She feels she is doing much better now and wants to return to PT for exericse to get stronger with functional goal of being able to climb the stairs she needs to get to her  bedroom at her sisters house independently. Upon re-evaluation today, she had increased scores in the BERG and 5 times sit to stand test,and grip strength, but she still is at high risk to fall according to those measures as well as the TUG. She has decreased hip abductor strength. She wants to come to PT to get stronger, have better balance and be able to climb the stairs at home.    Personal Factors and Comorbidities Age;Behavior Pattern;Fitness;Transportation    Examination-Activity Limitations Locomotion Level;Stairs    Examination-Participation Restrictions Meal Prep;Cleaning;Community Activity;Driving;Shop    Stability/Clinical Decision Making Stable/Uncomplicated    Clinical Decision Making Low    Rehab Potential Fair    PT Frequency 2x / week    PT Duration 6 weeks    PT Treatment/Interventions ADLs/Self Care Home Management;Therapeutic exercise;Gait training;Therapeutic activities;Patient/family education    PT Next Visit Plan  begin general exercise program including gradually progressive resistive exercises, especially for legs, gait and balance training progressing to stair training and fall risk reduction.    PT Home Exercise Plan 5 reps of sit to stand 3 times a day.    Consulted and Agree with Plan of Care Patient    Family Member Consulted daugther, [place]             Patient will benefit from skilled therapeutic intervention in order to improve the following deficits and impairments:  Abnormal gait, Decreased knowledge of use of DME, Decreased coordination, Difficulty walking  Visit Diagnosis: Muscle weakness (generalized) - Plan: PT plan of care cert/re-cert  Recurrent falls - Plan: PT plan of care cert/re-cert  Other abnormalities of gait and mobility - Plan: PT plan of care cert/re-cert     Problem List Patient Active Problem List   Diagnosis Date Noted   Genetic testing 11/25/2020   Family history of breast cancer    Family history of prostate cancer     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 Owens Shark PT  Norwood Levo 05/05/2021, 2:15 PM  [place], [place], [address] Phone: [phone]   Fax:  [phone]  Name: Pamela Kennedy MRN: [id number] Date of Birth: [birth year]

## 2021-05-05 NOTE — Patient Instructions (Signed)
Do 5 repetitions of sit to stand 3 times a day  (Consider doing before or after meals as a reminder )

## 2021-05-08 ENCOUNTER — Other Ambulatory Visit (HOSPITAL_COMMUNITY): Payer: Self-pay

## 2021-05-09 ENCOUNTER — Telehealth: Payer: Self-pay

## 2021-05-09 NOTE — Telephone Encounter (Signed)
This nurse spoke with patients daughter to verify if patient was currently taking an oral iron tablet.  The daughter denies that patient is taking an iron supplement.  This nurse informed that patients iron levels were low from her last labs and Dr. Burr Medico recommends  that patient take an OTC Ferrous Sulfate 1-2 tablets a day.  Daughter acknowledged understanding.  No further questions or concerns at this time.

## 2021-05-16 ENCOUNTER — Ambulatory Visit: Payer: Medicare Other

## 2021-05-16 ENCOUNTER — Other Ambulatory Visit: Payer: Self-pay

## 2021-05-16 DIAGNOSIS — I82432 Acute embolism and thrombosis of left popliteal vein: Secondary | ICD-10-CM | POA: Diagnosis not present

## 2021-05-16 DIAGNOSIS — D509 Iron deficiency anemia, unspecified: Secondary | ICD-10-CM | POA: Diagnosis not present

## 2021-05-16 DIAGNOSIS — D563 Thalassemia minor: Secondary | ICD-10-CM | POA: Diagnosis not present

## 2021-05-16 DIAGNOSIS — C23 Malignant neoplasm of gallbladder: Secondary | ICD-10-CM | POA: Diagnosis present

## 2021-05-16 DIAGNOSIS — R2689 Other abnormalities of gait and mobility: Secondary | ICD-10-CM

## 2021-05-16 DIAGNOSIS — F32A Depression, unspecified: Secondary | ICD-10-CM | POA: Diagnosis not present

## 2021-05-16 DIAGNOSIS — Z79899 Other long term (current) drug therapy: Secondary | ICD-10-CM | POA: Diagnosis not present

## 2021-05-16 DIAGNOSIS — Z87891 Personal history of nicotine dependence: Secondary | ICD-10-CM | POA: Diagnosis not present

## 2021-05-16 DIAGNOSIS — R296 Repeated falls: Secondary | ICD-10-CM | POA: Insufficient documentation

## 2021-05-16 DIAGNOSIS — M6281 Muscle weakness (generalized): Secondary | ICD-10-CM | POA: Insufficient documentation

## 2021-05-16 DIAGNOSIS — Z7901 Long term (current) use of anticoagulants: Secondary | ICD-10-CM | POA: Diagnosis not present

## 2021-05-16 DIAGNOSIS — E119 Type 2 diabetes mellitus without complications: Secondary | ICD-10-CM | POA: Diagnosis not present

## 2021-05-16 DIAGNOSIS — Z7984 Long term (current) use of oral hypoglycemic drugs: Secondary | ICD-10-CM | POA: Diagnosis not present

## 2021-05-16 NOTE — Therapy (Signed)
[place], [place], [address] Phone: [phone]   Fax:  [phone]  Physical Therapy Treatment  Patient Details  Name: Pamela Kennedy MRN: [id number] Date of Birth: [birth year] Referring Provider (PT): Dr. Burr Medico   Encounter Date: 05/16/2021   PT End of Session - 05/16/21 1550     Visit Number 2    Number of Visits 13    Date for PT Re-Evaluation 06/16/21    Authorization Type MCR/MCD    PT Start Time 1501    PT Stop Time 1550    PT Time Calculation (min) 49 min    Activity Tolerance Patient tolerated treatment well    Behavior During Therapy [facility] for tasks assessed/performed             Past Medical History:  Diagnosis Date   Depression    Diabetes mellitus without complication ([place])    Family history of breast cancer    Family history of prostate cancer    Overactive bladder     Past Surgical History:  Procedure Laterality Date   CHOLECYSTECTOMY      There were no vitals filed for this visit.   Subjective Assessment - 05/16/21 1502     Subjective I am having a lot of pain in my back that started about  2 weeks ago. She fell on July 30 trying to play badminton with her 70 yr. old grandson and she fell backwards.  It hurt alot initially, then got better and it is hurting more again.  Pain is in left LB. sometimes it hurts when she gets up in the am and she is very flexed, and she has to straighten up slowly. She takes Tylenol and that helps   Pertinent History History of gallbladder surgery for cancer in 2021 with possible stroke occurring.  Rehab after the surgery x 3 weeks then returning to home with sister. Pt had hospitalization in May and is having difficulty recovering her strength. Pt is no longer getting a chemo pill    Patient Stated Goals pt wants to go up and down the stairs by herself    Currently in Pain? Yes    Pain Score 4     Pain Location Back    Pain Orientation  Left    Pain Descriptors / Indicators Aching    Pain Type Acute pain    Pain Onset 1 to 4 weeks ago    Pain Frequency Intermittent                               [facility] Adult PT Treatment/Exercise - 05/16/21 0001       Lumbar Exercises: Supine   Pelvic Tilt 10 reps    Heel Slides 10 reps   bilateral with ppt   Bent Knee Raise 15 reps   alternating     Knee/Hip Exercises: Standing   Heel Raises Both;1 set;10 reps    Hip Abduction: Hip flexors Stengthening;Right;1 set;10 reps  Marching x 15 B     Knee/Hip Exercises: Seated   Long Arc Quad Strengthening;1 set;10 reps;Right;Left    Ball Squeeze 10 reps 5 sec hold    Clamshell with TheraBand Red   2 x10   Marching 1 set;Right;Left      Knee/Hip Exercises: Supine   Bridges Strengthening;Both;2 sets;5 reps  PT Education - 05/16/21 1554     Education Details Pt was educated in beginning level HEP in standing, sitting and supine    Person(s) Educated Patient    Methods Explanation;Handout    Comprehension Returned demonstration;Verbal cues required              PT Short Term Goals - 05/05/21 1410       PT SHORT TERM GOAL #1   Title Pt will be independent in a beginning home exercise program for leg strength    Time 3    Period Weeks    Status New               PT Long Term Goals - 05/05/21 1410       PT LONG TERM GOAL #1   Title Pt will be ind with final HEP for continued strength and mobility    Time 6    Period Weeks    Status New      PT LONG TERM GOAL #2   Title Pt will improve BERG by at least 46 points to demonstrate improved balance    Baseline 44 on eval    Time 6    Period Weeks    Status New      PT LONG TERM GOAL #3   Title Pt will decrease TUG to < 9.5 seconds demonstrating improved functional moblity    Time 6    Period Weeks    Status New      PT LONG TERM GOAL #4   Title Pt will be educated on fall risk strategies for home    Time  6    Period Weeks    Status New                   Plan - 05/16/21 1551     Clinical Impression Statement Initiated light supine, sitting and standing strengthening exs, with gait belt used in standing for safety.  Pt required VC's to slow down with exercises but had no increased pain and liked the way they felt.  We also practiced proper way to perform supine to and from sit.  Pt was issued an illustrated HEP.    Personal Factors and Comorbidities Age;Behavior Pattern;Fitness;Transportation    Examination-Activity Limitations Locomotion Level;Stairs    Examination-Participation Restrictions Meal Prep;Cleaning;Community Activity;Driving;Shop    Stability/Clinical Decision Making Stable/Uncomplicated    Rehab Potential Fair    PT Frequency 2x / week    PT Duration 6 weeks    PT Treatment/Interventions ADLs/Self Care Home Management;Therapeutic exercise;Gait training;Therapeutic activities;Patient/family education    PT Next Visit Plan begin general exercise program including gradually progressive resistive exercises, especially for legs, gait and balance training progressing to stair training and fall risk reduction.    PT Home Exercise Plan ppt, ppt with march and heel slide, standing heel raises, standing hip abd, marching, bridge    Consulted and Agree with Plan of Care Patient    Family Member Consulted daugther, [place]             Patient will benefit from skilled therapeutic intervention in order to improve the following deficits and impairments:  Abnormal gait, Decreased knowledge of use of DME, Decreased coordination, Difficulty walking  Visit Diagnosis: Muscle weakness (generalized)  Recurrent falls  Other abnormalities of gait and mobility     Problem List Patient Active Problem List   Diagnosis Date Noted   Genetic testing 11/25/2020   Family history of breast cancer  Family history of prostate cancer    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

## 2021-05-16 NOTE — Patient Instructions (Signed)
Access Code: [email or web address] URL: [email or web address] Date: 05/16/2021 Prepared by: Cheral Almas  Exercises Supine Posterior Pelvic Tilt - 1 x daily - 7 x weekly - 3 sets - 10 reps Supine Bridge - 1 x daily - 5 x weekly - 1 sets - 10 reps Supine Transversus Abdominis Bracing with Heel Slide - 1 x daily - 5 x weekly - 10 reps Supine March with Posterior Pelvic Tilt - 1 x daily - 5 x weekly - 1 sets - 10 reps Standing March with Counter Support - 1 x daily - 5 x weekly - 1 sets - 10 reps Standing Hip Abduction with Counter Support - 1 x daily - 5 x weekly - 3 sets - 10 reps Standing Heel Raise with Support - 1 x daily - 5 x weekly - 3 sets - 10 reps

## 2021-05-18 ENCOUNTER — Ambulatory Visit (HOSPITAL_COMMUNITY)
Admission: RE | Admit: 2021-05-18 | Discharge: 2021-05-18 | Disposition: A | Discharge status: Home / Self Care | Payer: Medicare Other | Source: Ambulatory Visit | Attending: Hematology | Admitting: Hematology

## 2021-05-18 ENCOUNTER — Inpatient Hospital Stay: Payer: Medicare Other | Attending: Hematology

## 2021-05-18 ENCOUNTER — Other Ambulatory Visit: Payer: Self-pay | Admitting: Hematology

## 2021-05-18 ENCOUNTER — Other Ambulatory Visit: Payer: Self-pay

## 2021-05-18 DIAGNOSIS — M6281 Muscle weakness (generalized): Secondary | ICD-10-CM | POA: Insufficient documentation

## 2021-05-18 DIAGNOSIS — R2689 Other abnormalities of gait and mobility: Secondary | ICD-10-CM | POA: Insufficient documentation

## 2021-05-18 DIAGNOSIS — C23 Malignant neoplasm of gallbladder: Secondary | ICD-10-CM

## 2021-05-18 DIAGNOSIS — F32A Depression, unspecified: Secondary | ICD-10-CM | POA: Insufficient documentation

## 2021-05-18 DIAGNOSIS — R296 Repeated falls: Secondary | ICD-10-CM | POA: Insufficient documentation

## 2021-05-18 DIAGNOSIS — Z79899 Other long term (current) drug therapy: Secondary | ICD-10-CM | POA: Insufficient documentation

## 2021-05-18 DIAGNOSIS — E119 Type 2 diabetes mellitus without complications: Secondary | ICD-10-CM | POA: Insufficient documentation

## 2021-05-18 DIAGNOSIS — D563 Thalassemia minor: Secondary | ICD-10-CM | POA: Insufficient documentation

## 2021-05-18 DIAGNOSIS — Z7901 Long term (current) use of anticoagulants: Secondary | ICD-10-CM | POA: Insufficient documentation

## 2021-05-18 DIAGNOSIS — Z7984 Long term (current) use of oral hypoglycemic drugs: Secondary | ICD-10-CM | POA: Insufficient documentation

## 2021-05-18 DIAGNOSIS — Z87891 Personal history of nicotine dependence: Secondary | ICD-10-CM | POA: Insufficient documentation

## 2021-05-18 DIAGNOSIS — D509 Iron deficiency anemia, unspecified: Secondary | ICD-10-CM | POA: Insufficient documentation

## 2021-05-18 DIAGNOSIS — I82432 Acute embolism and thrombosis of left popliteal vein: Secondary | ICD-10-CM | POA: Insufficient documentation

## 2021-05-18 LAB — CMP (CANCER CENTER ONLY)
ALT: 12 U/L (ref 0–44)
AST: 17 U/L (ref 15–41)
Albumin: 3.5 g/dL (ref 3.5–5.0)
Alkaline Phosphatase: 133 U/L — ABNORMAL HIGH (ref 38–126)
Anion gap: 9 (ref 5–15)
BUN: 13 mg/dL (ref 8–23)
CO2: 26 mmol/L (ref 22–32)
Calcium: 9.6 mg/dL (ref 8.9–10.3)
Chloride: 107 mmol/L (ref 98–111)
Creatinine: 0.77 mg/dL (ref 0.44–1.00)
GFR, Estimated: >60 mL/min (ref >60)
Glucose, Bld: 117 mg/dL — ABNORMAL HIGH (ref 70–99)
Potassium: 4.1 mmol/L (ref 3.5–5.1)
Sodium: 142 mmol/L (ref 135–145)
Total Bilirubin: 0.2 mg/dL — ABNORMAL LOW (ref 0.3–1.2)
Total Protein: 7 g/dL (ref 6.5–8.1)

## 2021-05-18 LAB — CBC WITH DIFFERENTIAL (CANCER CENTER ONLY)
Abs Immature Granulocytes: 0.01 10*3/uL (ref 0.00–0.07)
Basophils Absolute: 0.1 10*3/uL (ref 0.0–0.1)
Basophils Relative: 1 %
Eosinophils Absolute: 0.1 10*3/uL (ref 0.0–0.5)
Eosinophils Relative: 2 %
HCT: 35.4 % — ABNORMAL LOW (ref 36.0–46.0)
Hemoglobin: 10.2 g/dL — ABNORMAL LOW (ref 12.0–15.0)
Immature Granulocytes: 0 %
Lymphocytes Relative: 32 %
Lymphs Abs: 2.3 10*3/uL (ref 0.7–4.0)
MCH: 20 pg — ABNORMAL LOW (ref 26.0–34.0)
MCHC: 28.8 g/dL — ABNORMAL LOW (ref 30.0–36.0)
MCV: 69.3 fL — ABNORMAL LOW (ref 80.0–100.0)
Monocytes Absolute: 0.5 10*3/uL (ref 0.1–1.0)
Monocytes Relative: 7 %
Neutro Abs: 4.3 10*3/uL (ref 1.7–7.7)
Neutrophils Relative %: 58 %
Platelet Count: 334 10*3/uL (ref 150–400)
RBC: 5.11 MIL/uL (ref 3.87–5.11)
RDW: 18.6 % — ABNORMAL HIGH (ref 11.5–15.5)
WBC Count: 7.3 10*3/uL (ref 4.0–10.5)
nRBC: 0 % (ref 0.0–0.2)

## 2021-05-18 MED ORDER — IOHEXOL 350 MG/ML SOLN
80.0000 mL | Freq: Once | INTRAVENOUS | Status: AC | PRN
  Administered 2021-05-18: 80 mL via INTRAVENOUS

## 2021-05-18 NOTE — Progress Notes (Signed)
Patient ID: Pamela Kennedy, female   DOB: [birth year], 65 y.o.   MRN: [id number] Pt presented to [facility] radiology dept today for OP CT C/A/P; rt antecubital IV access established by IV team due to poor venous access. Pt had approx 80 cc contrast extrav into rt antecubital /lower bicep region. Site currently soft with some some edema medial bicep region, no skin breakdown, erythema or worsening pain; pt denies hand/arm paresthesias. Rec ice pack to region and arm elevation when possible for short intervals throughout next few days. Pt told to contact radiology with any worsening symptoms. We will also f/u with pt via phone call tomorrow.

## 2021-05-19 ENCOUNTER — Ambulatory Visit: Payer: Medicare Other | Admitting: Physical Therapy

## 2021-05-19 ENCOUNTER — Encounter: Payer: Self-pay | Admitting: Physical Therapy

## 2021-05-19 ENCOUNTER — Telehealth: Payer: Self-pay | Admitting: Radiology

## 2021-05-19 DIAGNOSIS — R296 Repeated falls: Secondary | ICD-10-CM

## 2021-05-19 DIAGNOSIS — C23 Malignant neoplasm of gallbladder: Secondary | ICD-10-CM | POA: Diagnosis not present

## 2021-05-19 DIAGNOSIS — R2689 Other abnormalities of gait and mobility: Secondary | ICD-10-CM

## 2021-05-19 DIAGNOSIS — M6281 Muscle weakness (generalized): Secondary | ICD-10-CM

## 2021-05-19 NOTE — Therapy (Signed)
[place], [place], [address] Phone: [phone]   Fax:  [phone]  Physical Therapy Treatment  Patient Details  Name: Pamela Kennedy MRN: [id number] Date of Birth: [birth year] Referring Provider (PT): Dr. Burr Medico   Encounter Date: 05/19/2021   PT End of Session - 05/19/21 1148     Visit Number 3    Number of Visits 13    Date for PT Re-Evaluation 06/16/21    PT Start Time 1105    PT Stop Time 1145    PT Time Calculation (min) 40 min    Activity Tolerance Patient tolerated treatment well    Behavior During Therapy [facility] for tasks assessed/performed             Past Medical History:  Diagnosis Date   Depression    Diabetes mellitus without complication ([place])    Family history of breast cancer    Family history of prostate cancer    Overactive bladder     Past Surgical History:  Procedure Laterality Date   CHOLECYSTECTOMY      There were no vitals filed for this visit.   Subjective Assessment - 05/19/21 1112     Subjective Pt said that her back feels better    Pertinent History History of gallbladder surgery for cancer in 2021 with possible stroke occurring.  Rehab after the surgery x 3 weeks then returning to home with sister. Pt had hospitalization in May and is having difficulty recovering her strength. Pt is no longer getting a chemo pill    Patient Stated Goals pt wants to go up and down the stairs by herself    Currently in Pain? No/denies                               [facility] Adult PT Treatment/Exercise - 05/19/21 0001       Ambulation/Gait   Ambulation/Gait Yes    Ambulation/Gait Assistance 7: Independent    Ambulation Distance (Feet) 200 Feet    Stairs Yes    Stairs Assistance 5: Supervision    Stairs Assistance Details (indicate cue type and reason) pt says she prefers to do one step at time for safery    Stair Management Technique One rail  Left;Forwards    Height of Stairs --   both 4 inch and 8 inch steps     Exercises   Exercises Other Exercises    Other Exercises  rEMOM of 30 second work and 30 second rest of sit to stand, 1 # raise to bottom shelf 3 reps with each hand alternating, dead lift with 1# in each hand , side steps, bicep curls with 1# in each hand  to taps to 4 inch step      Knee/Hip Exercises: Standing   Other Standing Knee Exercises heel raises 5 reps with hands on counter    Other Standing Knee Exercises point front, middle, side, middle, back , middle x 5 reps with each leg.                    PT Education - 05/19/21 1148     Education Details try to increase activity throughout the day, getting up and walking around at least once an hour while watching TV    Person(s) Educated Patient    Methods Explanation    Comprehension Verbalized understanding  PT Short Term Goals - 05/05/21 1410       PT SHORT TERM GOAL #1   Title Pt will be independent in a beginning home exercise program for leg strength    Time 3    Period Weeks    Status New               PT Long Term Goals - 05/05/21 1410       PT LONG TERM GOAL #1   Title Pt will be ind with final HEP for continued strength and mobility    Time 6    Period Weeks    Status New      PT LONG TERM GOAL #2   Title Pt will improve BERG by at least 46 points to demonstrate improved balance    Baseline 44 on eval    Time 6    Period Weeks    Status New      PT LONG TERM GOAL #3   Title Pt will decrease TUG to < 9.5 seconds demonstrating improved functional moblity    Time 6    Period Weeks    Status New      PT LONG TERM GOAL #4   Title Pt will be educated on fall risk strategies for home    Time 6    Period Weeks    Status New                   Plan - 05/19/21 1150     Clinical Impression Statement Pt did well with rounds of exercises with rest in between  She also began stair training  today but chooses to do one step at a time as she feels safer. Pt needed some cues to slow down and try to control movments as he tend to lose control of her balance if she moves too fast    Personal Factors and Comorbidities Age;Behavior Pattern;Fitness;Transportation    Examination-Activity Limitations Locomotion Level;Stairs    Examination-Participation Restrictions Meal Prep;Cleaning;Community Activity;Driving;Shop    Rehab Potential Fair    PT Frequency 2x / week    PT Duration 6 weeks    PT Treatment/Interventions ADLs/Self Care Home Management;Therapeutic exercise;Gait training;Therapeutic activities;Patient/family education    PT Next Visit Plan continue  general exercise program including gradually progressive resistive exercises, especially for legs, gait and balance training progressing to stair training and fall risk reduction.  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 Owens Shark PT  [place] Levo 05/19/2021, 11:53 AM  [place], [place], [address] Phone: [phone]   Fax:  [phone]  Name: Pamela Kennedy MRN: [id number] Date of Birth: Oct 22, 1955

## 2021-05-19 NOTE — Telephone Encounter (Signed)
Contacted pt today to check status of previous rt arm IV site following contrast extravasation on 8/11. Per pt, arm site is stable without worsening pain/edema or skin changes; she was encouraged to cont with prn ice pack application to region along with intermittent arm elevation when resting to promote venous drainage. She was told to contact us or oncologist with any new concerns/changes.

## 2021-05-22 ENCOUNTER — Inpatient Hospital Stay (HOSPITAL_BASED_OUTPATIENT_CLINIC_OR_DEPARTMENT_OTHER): Payer: Medicare Other | Admitting: Hematology

## 2021-05-22 ENCOUNTER — Encounter: Payer: Self-pay | Admitting: Hematology

## 2021-05-22 ENCOUNTER — Ambulatory Visit: Payer: Medicare Other

## 2021-05-22 ENCOUNTER — Other Ambulatory Visit: Payer: Self-pay

## 2021-05-22 VITALS — BP 139/81 | HR 97 | Temp 98.0°F | Resp 18 | Ht 64.0 in | Wt 181.9 lb

## 2021-05-22 DIAGNOSIS — R296 Repeated falls: Secondary | ICD-10-CM

## 2021-05-22 DIAGNOSIS — D649 Anemia, unspecified: Secondary | ICD-10-CM

## 2021-05-22 DIAGNOSIS — C23 Malignant neoplasm of gallbladder: Secondary | ICD-10-CM

## 2021-05-22 DIAGNOSIS — M6281 Muscle weakness (generalized): Secondary | ICD-10-CM

## 2021-05-22 DIAGNOSIS — R2689 Other abnormalities of gait and mobility: Secondary | ICD-10-CM

## 2021-05-22 NOTE — Progress Notes (Signed)
[place]   [phone] [phone]   Clinic Follow up Note   Patient Care Team: System, Provider Not In as PCP - General [name], MD (Psychiatry) Laretta Bolster, NP as Nurse Practitioner (Internal Medicine) Truitt Merle, MD as Consulting Physician (Oncology) Jonnie Finner, RN (Inactive) as Oncology Nurse Navigator  Date of Service:  05/22/2021  CHIEF COMPLAINT: f/u of gallbladder cancer  SUMMARY OF ONCOLOGIC HISTORY: Oncology History Overview Note  Cancer Staging Primary gall bladder adenocarcinoma [facility]) Staging form: Gallbladder, AJCC 8th Edition - Pathologic stage from 09/20/2020: Stage IIIB (pT3, pN1, cM0) - Signed by Truitt Merle, MD on 11/03/2020 Histologic grade (G): G3 Histologic grading system: 3 grade system Residual tumor (R): R0 - None Histologic sub-type: Adenocarcinoma    Primary gall bladder adenocarcinoma ([place])  07/05/2020 Initial Biopsy   Cholecystectomy by Dr Moreen Fowler   Final Diagnosis A. Gallbladder, Cholecystectomy:  -Invasive adenocarcinoma, moderately to poorly-differentiated, infiltrating into the perimuscular fibroconnective tissue.  -Multifocal perineural invasion identified.  -The cystic duct margin is negative for carcinoma.  -Gallbladder with acute necrotizing cholecystitis and cholelithiasis.     08/17/2020 Imaging   MRI Abdomen at [facility]  IMPRESSION -Limited evaluation secondary to motion and use of motion insensitive sequences with suboptimal arterial phase timing.   -Multiloculated collection within the gallbladder fossa with subtle peripheral enhancement may represent a postoperative seroma versus biloma. An abscess is thought to be unlikely.   -Prominent portacaval nodes, indeterminate and possibly reactive.   -Asymmetric left breast tissue likely fibroglandular tissue. Recommend  correlation with mammogram     08/25/2020 Imaging   CT Chest 08/25/20 at [facility] IMPRESSION:   No evidence of  intrathoracic metastatic disease.   Focal nodular asymmetries in the left breast requiring follow-up to exclude neoplastic etiology.   Recent cholecystectomy with slight inflammatory stranding at the gallbladder fossa.   Probable thyroid goiter with mild enlargement and hypodense lesions.   09/20/2020 Cancer Staging   Staging form: Gallbladder, AJCC 8th Edition - Pathologic stage from 09/20/2020: Stage IIIB (pT3, pN1, cM0) - Signed by Truitt Merle, MD on 11/03/2020   09/20/2020 Surgery   Surgery of liver and abdomen by Dr Olen Pel at [facility]    09/20/2020 Pathology Results    Final Diagnosis   A: Lymph nodes, common and proper hepatic, lymphadenectomy: -One of seven lymph nodes, positive for adenocarcinoma. (1/7) -The lymph nodes show nonnecrotizing granulomatous reaction.   B: Lymph nodes, hepatic duodenal, lymphadenectomy: -One of two lymph nodes, positive for adenocarcinoma. (1/2)   C: Cystic duct, biopsy: -Bile duct, negative for carcinoma.   D: Liver, partial hepatectomy (gallbladder fossa): -Adenocarcinoma, poorly-differentiated, 1.0 cm in size,  infiltrating liver. -The cauterized liver parenchymal resection margin is negative for carcinoma.  (See comment)   11/03/2020 Initial Diagnosis   Primary gall bladder adenocarcinoma ([place])   11/08/2020 Tumor Marker   CA 19-9 - less than 2   11/14/2020 -  Chemotherapy   Xeloda 2055m Bid for day 1-14 every 21 days starting on 11/14/20    11/25/2020 Genetic Testing   Negative genetic testing:  No pathogenic variants detected on the [facility] Multi-Cancer + RNA panel. The report date is 11/25/2020.   The Multi-Cancer + RNA Panel offered by [facility] includes sequencing and/or deletion/duplication analysis of the following 84 genes:  AIP*, ALK, APC*, ATM*, AXIN2*, BAP1*, BARD1*, BLM*, BMPR1A*, BRCA1*, BRCA2*, BRIP1*, CASR, CDC73*, CDH1*, CDK4, CDKN1B*, CDKN1C*, CDKN2A, CEBPA, CHEK2*, CTNNA1*, DICER1*, DIS3L2*, EGFR, EPCAM, FH*, FLCN*, GATA2*, GPC3,  GREM1, HOXB13, HRAS, KIT,  MAX*, MEN1*, MET, MITF, MLH1*, MSH2*, MSH3*, MSH6*, MUTYH*, NBN*, NF1*, NF2*, NTHL1*, PALB2*, PDGFRA, PHOX2B, PMS2*, POLD1*, POLE*, POT1*, PRKAR1A*, PTCH1*, PTEN*, RAD50*, RAD51C*, RAD51D*, RB1*, RECQL4, RET, RUNX1*, SDHA*, SDHAF2*, SDHB*, SDHC*, SDHD*, SMAD4*, SMARCA4*, SMARCB1*, SMARCE1*, STK11*, SUFU*, TERC, TERT, TMEM127*, Tp53*, TSC1*, TSC2*, VHL*, WRN*, and WT1.  RNA analysis is performed for * genes.    02/06/2021 Imaging   CT AP with [facility]  Fluid-filled colon. Diarrhea?  No pneumatosis or vein gas identified. No abscess. Patent mesenteric arteries and veins.   Mild bilateral hydronephroureter without obstructing calculus. Urinary bladder distention   02/06/2021 Imaging   US Liver at [facility]  Mildly heterogeneous and mild-to-moderately increased hepatic echotexture suggestive of fatty infiltration of the liver. No evidence of hepatic mass or intrahepatic or extrahepatic biliary ductal dilatation. Nonvisualized gallbladder consistent with prior cholecystectomy. Otherwise unremarkable exam.    02/06/2021 Imaging   MRI Brain at [facility]  CONCLUSION:  1. Mild cortical neuronal volume loss with mild white matter small vessel angiopathy without acute intracranial abnormality.    Addendum:  Stable left posterior lateral parietal lobe 2.1 x 1.2 cm extra-axial  meningioma series 6:16.  The mass is not well demarcated on the FLAIR and T1 weighted series  secondary to patient motion.    02/07/2021 Procedure   Upper Endoscopy at [facility]  IMPRESSION - Normal esophagus.                         - Gastric mucosal atrophy.                         - Normal examined duodenum.                         - No specimens collected   05/18/2021 Imaging   CT CAP w/o contrast (d/t vein issues)  IMPRESSION: Chest Impression: 1. No evidence of thoracic metastasis. 2. Coronary artery calcification and Aortic Atherosclerosis (ICD10-I70.0).   Abdomen / Pelvis Impression: 1. No evidence  of local recurrence of gallbladder carcinoma. No IV contrast was administered as described in the technique section. 2. No adenopathy in the abdomen pelvis.  No peritoneal metastasis. 3. No skeletal metastasis identified.      CURRENT THERAPY:  Xeloda 2030m Bid for day 1-14 every 21 days starting on 11/14/20. Reduced to 15069mBID starting with C4 on 01/23/21. s/p 4 cycles. Discontinued 02/06/21 due to her hospitalization.  INTERVAL HISTORY:  VeWanona Stares here for a follow up of gallbladder cancer. She was last seen by me on 04/27/21. She presents to the clinic accompanied by her daughter. She reports doing well overall. She continues on physical therapy twice a week. Her daughter notes she is more active now. Her daughter reports some of Davida's medicines have been changed. She provided me with the phone number of their pharmacy to check her current medication list.   All other systems were reviewed with the patient and are negative.  MEDICAL HISTORY:  Past Medical History:  Diagnosis Date   Depression    Diabetes mellitus without complication ([place]   Family history of breast cancer    Family history of prostate cancer    Overactive bladder     SURGICAL HISTORY: Past Surgical History:  Procedure Laterality Date   CHOLECYSTECTOMY      I have reviewed the social history and family history with the patient and they are unchanged from previous  note.  ALLERGIES:  has No Known Allergies.  MEDICATIONS:  Current Outpatient Medications  Medication Sig Dispense Refill   aspirin EC 81 MG tablet Take 81 mg by mouth daily. Swallow whole. (Patient not taking: Reported on 05/05/2021)     benztropine (COGENTIN) 0.5 MG tablet Take 0.5 mg by mouth 2 (two) times daily.     capecitabine (XELODA) 500 MG tablet Take 3 tabs twice a day every 12 hours, for 7 days on then 7 days off (Patient not taking: Reported on 05/05/2021) 84 tablet 0   fesoterodine (TOVIAZ) 8 MG TB24 tablet Take 8  mg by mouth daily. (Patient not taking: Reported on 05/05/2021)     furosemide (LASIX) 20 MG tablet Take 1 & 1/2 tablets (30 mg total) by mouth daily as needed. 30 tablet 0   lovastatin (ALTOPREV) 20 MG 24 hr tablet Take 20 mg by mouth at bedtime. (Patient not taking: Reported on 05/05/2021)     megestrol (MEGACE ES) 625 MG/5ML suspension Take 5 mLs (625 mg total) by mouth daily. (Patient not taking: Reported on 05/05/2021) 150 mL 0   metFORMIN (GLUCOPHAGE) 500 MG tablet Take by mouth 2 (two) times daily with a meal.     ondansetron (ZOFRAN) 8 MG tablet TAKE 1 TABLET BY MOUTH EVERY 8 HOURS AS NEEDED FOR NAUSEA OR VOMITING (Patient not taking: Reported on 05/05/2021) 30 tablet 1   potassium chloride (KLOR-CON) 10 MEQ tablet TAKE 1 TABLET BY MOUTH ONCE A DAY (Patient not taking: Reported on 05/05/2021) 14 tablet 0   potassium chloride SA (KLOR-CON) 20 MEQ tablet Take 1 tablet (20 mEq total) by mouth daily. (Patient not taking: Reported on 05/05/2021) 20 tablet 0   raloxifene (EVISTA) 60 MG tablet Take 60 mg by mouth daily. (Patient not taking: Reported on 05/05/2021)     risperidone (RISPERDAL) 4 MG tablet Take 4 mg by mouth daily. At bedtime (Patient not taking: Reported on 05/05/2021)     rivaroxaban (XARELTO) 20 MG TABS tablet Take 1 tablet (20 mg total) by mouth daily with supper. (Patient not taking: Reported on 05/05/2021) 30 tablet 3   venlafaxine (EFFEXOR) 75 MG tablet Take 75 mg by mouth daily. (Patient not taking: Reported on 05/05/2021)     venlafaxine XR (EFFEXOR-XR) 150 MG 24 hr capsule Take 150 mg by mouth daily with breakfast.     No current facility-administered medications for this visit.    PHYSICAL EXAMINATION: ECOG PERFORMANCE STATUS: 2 - Symptomatic, <50% confined to bed  Vitals:   05/22/21 1057  BP: 139/81  Pulse: 97  Resp: 18  Temp: 98 F (36.7 C)  SpO2: 100%   Wt Readings from Last 3 Encounters:  05/22/21 181 lb 14.4 oz (82.5 kg)  04/27/21 178 lb 12.8 oz (81.1 kg)   04/06/21 185 lb (83.9 kg)     GENERAL:alert, no distress and comfortable SKIN: skin color normal, no rashes or significant lesions EYES: normal, Conjunctiva are pink and non-injected, sclera clear  NEURO: alert & oriented x 3 with fluent speech  LABORATORY DATA:  I have reviewed the data as listed CBC Latest Ref Rng & Units 05/18/2021 04/27/2021 04/06/2021  WBC 4.0 - 10.5 K/uL 7.3 9.6 8.1  Hemoglobin 12.0 - 15.0 g/dL 10.2(L) 9.8(L) 8.9(L)  Hematocrit 36.0 - 46.0 % 35.4(L) 33.6(L) 30.3(L)  Platelets 150 - 400 K/uL 334 409(H) 338     CMP Latest Ref Rng & Units 05/18/2021 04/27/2021 04/06/2021  Glucose 70 - 99 mg/dL 117(H) 171(H) 114(H)  BUN 8 -  23 mg/dL 13 11 7(L)  Creatinine 0.44 - 1.00 mg/dL 0.77 0.72 0.63  Sodium 135 - 145 mmol/L 142 140 141  Potassium 3.5 - 5.1 mmol/L 4.1 4.0 3.9  Chloride 98 - 111 mmol/L 107 108 111  CO2 22 - 32 mmol/L 26 23 19(L)  Calcium 8.9 - 10.3 mg/dL 9.6 9.3 8.3(L)  Total Protein 6.5 - 8.1 g/dL 7.0 6.6 5.4(L)  Total Bilirubin 0.3 - 1.2 mg/dL 0.2(L) 0.4 <0.2(L)  Alkaline Phos 38 - 126 U/L 133(H) 102 113  AST 15 - 41 U/L 17 19 9(L)  ALT 0 - 44 U/L _0 RADIOGRAPHIC STUDIES: I have personally reviewed the radiological images as listed and agreed with the findings in the report. No results found.   ASSESSMENT & PLAN:  Pamela Kennedy is a 65 y.o. female with   1. Gallbladder cancer cancer, pT3N1M0, stage IIIB -She had stage IIIb gallbladder cancer diagnosed in 06/2020, which was removed completely by 2 surgeries, with negative margins. Last surgery in 09/2020. -I started her on adjuvant Xeloda 1034m/m2 bid day 1-14 every 21 days for 6 months on  11/14/20. Given decreased tolerance, I reduced and postponed her Xeloda to 15044mBID starting cycle 4 on 01/23/21. -she was hospitalized for mental status change after cycle 4 chemo, and had a prolonged hospital stay. Xeloda held since then. She is recovering well now.  -CT CAP w/o contrast (due to  vein issues) on 05/18/21 showed no evidence of recurrence, adenopathy, or metastasis. I reviewed the results with them today. Plan for repeat CT with contrast in 6 months with contrast, will order at next visit. -I do not plan to restart Xeloda due to her poor tolerance.  -She is now on cancer surveillance given her hospital admission. -labs and f/u in 3 months   2. Falls, imbalance, Lethargy -She was hospitalized in May for lethargy, frequent fall -She also has slurred speak and lethargy. Family is concerned for stroke or this is due to her Psych meds (she has had issues with keeping medications well managed).  -Most recent brain MRI from 03/02/21 showed: no acute intracranial abnormality; chronic lacunar infarct of right thalamus; stable meningioma overlying left sylvian fissure; unchanged presumptive left pituitary adenoma -She is followed by Dr. MiSabra Heckn neurology. -She continues physical therapy and has had no recent falls. Her lethargy has also improved.   4. Genetics testing: Her 11/2020 results were negative for pathogenetic mutations.    5. 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 -I recommend obtaining iron panel in one month to confirm her iron level. We will give IV iron if needed.   6. DM, Hyperglycemia, Depression -She is on metformin for her DM. She is currently trying to find a new PCP. -For her depression, she will f/u with her psychiatrist.    7. Tobacco Use -She previously used snuff/dip. She and her daughter reports she is no longer using. I encouraged her to continue cessation, as the nicotine can cause blood clots.   8. LE Edema and DVT -I previously prescribed xarelto on 02/01/21. She stopped this during her  hospitalization due to GI bleeding. She continues to have b/l leg swelling, so I recommended she restart Xarelto at 20 mg daily on 03/27/21 -repeat Doppler on 04/28/21  confirmed left LE acute DVT involving popliteal and peroneal veins. Right LE was negative within limitation of poor ultrasound/tissue interface. -Her swelling has resolved (as of 05/22/21). We will continue Xarelto for 3 more months.     PLAN:  -continue Xarelto until next visit  -labs with iron panel in one month to see if she needs iv iron  -labs and f/u in 3 months    No problem-specific Assessment & Plan notes found for this encounter.   No orders of the defined types were placed in this encounter.  All questions were answered. The patient knows to call the clinic with any problems, questions or concerns. No barriers to learning was detected. The total time spent in the appointment was 30 minutes.     Aurea Graff 05/22/2021   I, Wilburn Mylar, am acting as scribe for Truitt Merle, MD.   I have reviewed the above documentation for accuracy and completeness, and I agree with the above.

## 2021-05-22 NOTE — Therapy (Signed)
[place], [place], [address] Phone: [phone]   Fax:  [phone]  Physical Therapy Treatment  Patient Details  Name: Pamela Kennedy MRN: [id number] Date of Birth: [birth year] Referring Provider (PT): Dr. Burr Medico   Encounter Date: 05/22/2021   PT End of Session - 05/22/21 1350     Visit Number 4    Number of Visits 13    Date for PT Re-Evaluation 06/16/21    Authorization Type MCR/MCD    PT Start Time 1301    PT Stop Time 1345    PT Time Calculation (min) 44 min    Activity Tolerance Patient tolerated treatment well    Behavior During Therapy [facility] for tasks assessed/performed             Past Medical History:  Diagnosis Date   Depression    Diabetes mellitus without complication ([place])    Family history of breast cancer    Family history of prostate cancer    Overactive bladder     Past Surgical History:  Procedure Laterality Date   CHOLECYSTECTOMY      There were no vitals filed for this visit.   Subjective Assessment - 05/22/21 1302     Subjective Saw the Dr. and they said I have no more Cancer. My back has been doing well.  No pain.  Pt reports compliance with exercises. She reports that she did well after last visit and had no increased pain.    Pertinent History History of gallbladder surgery for cancer in 2021 with possible stroke occurring.  Rehab after the surgery x 3 weeks then returning to home with sister. Pt had hospitalization in May and is having difficulty recovering her strength. Pt is no longer getting a chemo pill    Currently in Pain? No/denies    Pain Score 0-No pain                               [facility] Adult PT Treatment/Exercise - 05/22/21 0001       Exercises   Other Exercises  rEMOM of 30 second work and 30 second rest of sit to stand, 1 # raise to bottom shelf 3 reps with each hand alternating, side steps, bicep curls with 1# in each  hand,  toe taps to foot stool.      Knee/Hip Exercises: Standing   Heel Raises Both;1 set;10 reps   no hand hold, CGA of PT   Forward Step Up 2 sets;5 reps   holding bike for balance   Other Standing Knee Exercises Forward and lateral step ups on ax x 10 ea leg   CGA of PT   Other Standing Knee Exercises vector reaches x 5 ea      Shoulder Exercises: Standing   Extension Strengthening;Both;10 reps    Theraband Level (Shoulder Extension) Level 1 (Yellow)    Retraction Strengthening;Both;10 reps    Theraband Level (Shoulder Retraction) Level 1 (Yellow)                      PT Short Term Goals - 05/05/21 1410       PT SHORT TERM GOAL #1   Title Pt will be independent in a beginning home exercise program for leg strength    Time 3    Period Weeks    Status New  PT Long Term Goals - 05/05/21 1410       PT LONG TERM GOAL #1   Title Pt will be ind with final HEP for continued strength and mobility    Time 6    Period Weeks    Status New      PT LONG TERM GOAL #2   Title Pt will improve BERG by at least 46 points to demonstrate improved balance    Baseline 44 on eval    Time 6    Period Weeks    Status New      PT LONG TERM GOAL #3   Title Pt will decrease TUG to < 9.5 seconds demonstrating improved functional moblity    Time 6    Period Weeks    Status New      PT LONG TERM GOAL #4   Title Pt will be educated on fall risk strategies for home    Time 6    Period Weeks    Status New                   Plan - 05/22/21 1355     Clinical Impression Statement We continued 30 second rounds of exercise, with 30 seconds rest and practiced stairs and balance.  She did very well overall although her legs did fatigue and she required several rest breaks.  She required VC's not to arch her back with standing theraband exercises and VC's to slow down because if she got going to fast she would have a small LOB.  She is no longer complaining  of LBP .    Personal Factors and Comorbidities Age;Behavior Pattern;Fitness;Transportation    Examination-Activity Limitations Locomotion Level;Stairs    Examination-Participation Restrictions Meal Prep;Cleaning;Community Activity;Driving;Shop    Stability/Clinical Decision Making Stable/Uncomplicated    Rehab Potential Fair    PT Frequency 2x / week    PT Duration 6 weeks    PT Treatment/Interventions ADLs/Self Care Home Management;Therapeutic exercise;Gait training;Therapeutic activities;Patient/family education    PT Next Visit Plan continue  general exercise program including gradually progressive resistive exercises, especially for legs, gait and balance training progressing to stair training and fall risk reduction.  Continue circut type of exercises    PT Home Exercise Plan ppt, ppt with march and heel slide, standing heel raises, standing hip abd, marching, bridge    Consulted and Agree with Plan of Care Patient             Patient will benefit from skilled therapeutic intervention in order to improve the following deficits and impairments:  Abnormal gait, Decreased knowledge of use of DME, Decreased coordination, Difficulty walking  Visit Diagnosis: Muscle weakness (generalized)  Recurrent falls  Other abnormalities of gait and mobility     Problem List Patient Active Problem List   Diagnosis Date Noted   Genetic testing 11/25/2020   Family history of breast cancer    Family history of prostate cancer    Primary gall bladder adenocarcinoma ([place]) 11/03/2020    [name] 05/22/2021, 1:59 PM  [place], [place], [address] Phone: [phone]   Fax:  [phone]  Name: Pamela Kennedy MRN: [id number] Date of Birth: 1956-07-27  Cheral Almas, PT 05/22/21 2:00 PM

## 2021-05-24 ENCOUNTER — Encounter: Payer: Self-pay | Admitting: Physical Therapy

## 2021-05-24 ENCOUNTER — Ambulatory Visit: Payer: Medicare Other | Admitting: Physical Therapy

## 2021-05-24 ENCOUNTER — Other Ambulatory Visit: Payer: Self-pay

## 2021-05-24 DIAGNOSIS — C23 Malignant neoplasm of gallbladder: Secondary | ICD-10-CM | POA: Diagnosis not present

## 2021-05-24 DIAGNOSIS — M6281 Muscle weakness (generalized): Secondary | ICD-10-CM

## 2021-05-24 DIAGNOSIS — R2689 Other abnormalities of gait and mobility: Secondary | ICD-10-CM

## 2021-05-24 DIAGNOSIS — R296 Repeated falls: Secondary | ICD-10-CM

## 2021-05-24 NOTE — Therapy (Signed)
[place], [place], [address] Phone: [phone]   Fax:  [phone]  Physical Therapy Treatment  Patient Details  Name: Pamela Kennedy MRN: [id number] Date of Birth: 06/10/1956 Referring Provider (PT): Dr. Burr Medico   Encounter Date: 05/24/2021   PT End of Session - 05/24/21 1456     Visit Number 5    Number of Visits 13    Date for PT Re-Evaluation 06/16/21    PT Start Time 1300    PT Stop Time 1345    PT Time Calculation (min) 45 min    Behavior During Therapy [facility] for tasks assessed/performed             Past Medical History:  Diagnosis Date   Depression    Diabetes mellitus without complication ([place])    Family history of breast cancer    Family history of prostate cancer    Overactive bladder     Past Surgical History:  Procedure Laterality Date   CHOLECYSTECTOMY      There were no vitals filed for this visit.   Subjective Assessment - 05/24/21 1302     Subjective Pt said the her right leg is stronger than her left leg. she has some pain in her left leg sometimes at night  Daughter present and will take her to get compression stockings today    Patient is accompained by: Family member   Daughter   Pertinent History History of gallbladder surgery for cancer in 2021 with possible stroke occurring.  Rehab after the surgery x 3 weeks then returning to home with sister. Pt had hospitalization in May and is having difficulty recovering her strength. Pt is no longer getting a chemo pill    Patient Stated Goals pt wants to go up and down the stairs by herself    Currently in Pain? Yes    Pain Score 3     Pain Location Calf    Pain Orientation Left    Pain Descriptors / Indicators Aching                               [facility] Adult PT Treatment/Exercise - 05/24/21 0001       Ambulation/Gait   Ambulation/Gait Yes    Gait Comments obstacle course of step onto 6"  step, cobble board, step over weight, ball against wall and dribble and return. Pt able to remember sequence and did better with each repetition of course and was able to repeat 3 times      High Level Balance   High Level Balance Activities Side stepping;Backward walking;Direction changes;Turns;Sudden stops;Marching forwards;Marching backwards      Neuro Re-ed    Neuro Re-ed Details  "Clock yourself" app 30 seconds x 2 in standing . once with right hand only and once with 2 hands holding 1# weight.  Pt was able to do well, but found this activity challenging      Exercises   Other Exercises  2 rounds of rEMOM of 30 second work and 30 second rest : holding 1# in front for sitting marches with core stable, 1# of sit to stand, 1 # standing bilateral bicep curls,  green therband sitting hip extensions  front, center,  side center,back center, red therabnad row                      PT Short Term Goals - 05/05/21 1410  PT SHORT TERM GOAL #1   Title Pt will be independent in a beginning home exercise program for leg strength    Time 3    Period Weeks    Status New               PT Long Term Goals - 05/05/21 1410       PT LONG TERM GOAL #1   Title Pt will be ind with final HEP for continued strength and mobility    Time 6    Period Weeks    Status New      PT LONG TERM GOAL #2   Title Pt will improve BERG by at least 46 points to demonstrate improved balance    Baseline 44 on eval    Time 6    Period Weeks    Status New      PT LONG TERM GOAL #3   Title Pt will decrease TUG to < 9.5 seconds demonstrating improved functional moblity    Time 6    Period Weeks    Status New      PT LONG TERM GOAL #4   Title Pt will be educated on fall risk strategies for home    Time 6    Period Weeks    Status New                   Plan - 05/24/21 1457     Clinical Impression Statement Pt did very well with activites today that challenged her strength,  balance, cognitive abilities and endurance.  She was able to learn new activites and do better on subsequent trials of those acitities.  Cues included trying to keep core stable while she did exercises with unloaded legs and arms. She was able to correct with cues. Overall, pt appears to be improving with each session. Daughter will get compression stockings for pt today.    Personal Factors and Comorbidities Age;Behavior Pattern;Fitness;Transportation    Examination-Activity Limitations Locomotion Level;Stairs    Examination-Participation Restrictions Meal Prep;Cleaning;Community Activity;Driving;Shop    PT Frequency 2x / week    PT Treatment/Interventions ADLs/Self Care Home Management;Therapeutic exercise;Gait training;Therapeutic activities;Patient/family education    PT Next Visit Plan leg pain better with stockings? continue  general exercise program including gradually progressive resistive exercises, especially for legs, gait and balance training progressing to stair training and fall risk reduction.  Continue circut type of exercises  reassess objective measures    Consulted and Agree with Plan of Care Patient             Patient will benefit from skilled therapeutic intervention in order to improve the following deficits and impairments:  Abnormal gait, Decreased knowledge of use of DME, Decreased coordination, Difficulty walking  Visit Diagnosis: Muscle weakness (generalized)  Recurrent falls  Other abnormalities of gait and mobility     Problem List Patient Active Problem List   Diagnosis Date Noted   Genetic testing 11/25/2020   Family history of breast cancer    Family history of prostate cancer    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 Owens Shark PT  Norwood Levo 05/24/2021, 3:01 PM  [place], [place], [address] Phone: [phone]   Fax:   [phone]  Name: Pamela Kennedy MRN: [id number] Date of Birth: 01-23-1956

## 2021-05-29 ENCOUNTER — Other Ambulatory Visit: Payer: Self-pay

## 2021-05-29 ENCOUNTER — Ambulatory Visit: Payer: Medicare Other

## 2021-05-29 DIAGNOSIS — R296 Repeated falls: Secondary | ICD-10-CM

## 2021-05-29 DIAGNOSIS — C23 Malignant neoplasm of gallbladder: Secondary | ICD-10-CM | POA: Diagnosis not present

## 2021-05-29 DIAGNOSIS — M6281 Muscle weakness (generalized): Secondary | ICD-10-CM

## 2021-05-29 DIAGNOSIS — R2689 Other abnormalities of gait and mobility: Secondary | ICD-10-CM

## 2021-05-29 NOTE — Therapy (Signed)
[place], [place], [address] Phone: [phone]   Fax:  [phone]  Physical Therapy Treatment  Patient Details  Name: Pamela Kennedy MRN: [id number] Date of Birth: [birth year] Referring Provider (PT): Dr. Burr Medico   Encounter Date: 05/29/2021   PT End of Session - 05/29/21 1356     Visit Number 6    Number of Visits 13    Date for PT Re-Evaluation 06/16/21    Authorization Type MCR/MCD    PT Start Time 1301    PT Stop Time 1350    PT Time Calculation (min) 49 min    Equipment Utilized During Treatment Gait belt    Activity Tolerance Patient tolerated treatment well    Behavior During Therapy WFL for tasks assessed/performed             Past Medical History:  Diagnosis Date   Depression    Diabetes mellitus without complication ([place])    Family history of breast cancer    Family history of prostate cancer    Overactive bladder     Past Surgical History:  Procedure Laterality Date   CHOLECYSTECTOMY      There were no vitals filed for this visit.   Subjective Assessment - 05/29/21 1304     Subjective Feel like I am doing quite well. I walked this weekend some 1 day. Feel like I did well last time. I can do step over step now. I got my compression stockings but I don't have them on today. I had trouble getting them on.    Pertinent History History of gallbladder surgery for cancer in 2021 with possible stroke occurring.  Rehab after the surgery x 3 weeks then returning to home with sister. Pt had hospitalization in May and is having difficulty recovering her strength. Pt is no longer getting a chemo pill    Limitations Walking;Other (comment)    Patient Stated Goals pt wants to go up and down the stairs by herself    Currently in Pain? No/denies    Pain Score 0-No pain                               [facility] Adult PT Treatment/Exercise - 05/29/21 0001        Ambulation/Gait   Ambulation/Gait Yes    Stairs Assistance 5: Supervision    Stairs Assistance Details (indicate cue type and reason) able to do with alternate gait up and down   on 6 in step in peds gym   Stair Management Technique One rail Right      High Level Balance   High Level Balance Activities Side stepping;Direction changes;Sudden stops;Marching forwards   in long hallway     Neuro Re-ed    Neuro Re-ed Details  "Clock yourself" app 30 seconds x 3 in standing . once with right hand only and once with 2 hands holding 1# weight and once with 1# in each hand.  Pt was able to do well, but found this activity mildly challenging mostly in the 3rd round     Exercises   Other Exercises  2 rounds of rEMOM of 30 second work and 30 second rest : holding 1# in front for standing marches with core stable, 1# of sit to stand, 1 # standing bilateral bicep curls,  green therband sitting knee flexions , front, center,  side,center, back center, red therand row  PT Short Term Goals - 05/05/21 1410       PT SHORT TERM GOAL #1   Title Pt will be independent in a beginning home exercise program for leg strength    Time 3    Period Weeks    Status New               PT Long Term Goals - 05/05/21 1410       PT LONG TERM GOAL #1   Title Pt will be ind with final HEP for continued strength and mobility    Time 6    Period Weeks    Status New      PT LONG TERM GOAL #2   Title Pt will improve BERG by at least 46 points to demonstrate improved balance    Baseline 44 on eval    Time 6    Period Weeks    Status New      PT LONG TERM GOAL #3   Title Pt will decrease TUG to < 9.5 seconds demonstrating improved functional moblity    Time 6    Period Weeks    Status New      PT LONG TERM GOAL #4   Title Pt will be educated on fall risk strategies for home    Time 6    Period Weeks    Status New                   Plan - 05/29/21 1356      Clinical Impression Statement Continued Rounds of exercise, clock it with reaching to different colors of paper, balance activities in hallway, and stair training in peds gym.  Pt required intermittent VC's to perform exercises correctly, but did well overall.  She had 1 mild LOB self corrected when coming down last step, and several times while sidestepping to the left.  She gets mildly SOB with 30 min bouts of exercise. She was able to go up and down stairs with alternate pait pattern with 1 rail and supervision    Personal Factors and Comorbidities Age;Behavior Pattern;Fitness;Transportation    Examination-Activity Limitations Locomotion Level;Stairs    Examination-Participation Restrictions Meal Prep;Cleaning;Community Activity;Driving;Shop    Rehab Potential Fair    PT Frequency 2x / week    PT Duration 6 weeks    PT Treatment/Interventions ADLs/Self Care Home Management;Therapeutic exercise;Gait training;Therapeutic activities;Patient/family education    PT Next Visit Plan leg pain better with stockings? continue  general exercise program including gradually progressive resistive exercises, especially for legs, gait and balance training progressing to stair training and fall risk reduction.  Continue circut type of exercises  reassess objective measures    PT Home Exercise Plan ppt, ppt with march and heel slide, standing heel raises, standing hip abd, marching, bridge    Consulted and Agree with Plan of Care Patient             Patient will benefit from skilled therapeutic intervention in order to improve the following deficits and impairments:  Abnormal gait, Decreased knowledge of use of DME, Decreased coordination, Difficulty walking  Visit Diagnosis: Muscle weakness (generalized)  Recurrent falls  Other abnormalities of gait and mobility     Problem List Patient Active Problem List   Diagnosis Date Noted   Genetic testing 11/25/2020   Family history of breast cancer     Family history of prostate cancer    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

## 2021-05-31 ENCOUNTER — Other Ambulatory Visit: Payer: Self-pay

## 2021-05-31 ENCOUNTER — Ambulatory Visit: Payer: Medicare Other

## 2021-05-31 DIAGNOSIS — C23 Malignant neoplasm of gallbladder: Secondary | ICD-10-CM | POA: Diagnosis not present

## 2021-05-31 DIAGNOSIS — R296 Repeated falls: Secondary | ICD-10-CM

## 2021-05-31 DIAGNOSIS — M6281 Muscle weakness (generalized): Secondary | ICD-10-CM

## 2021-05-31 DIAGNOSIS — R2689 Other abnormalities of gait and mobility: Secondary | ICD-10-CM

## 2021-05-31 NOTE — Therapy (Signed)
[place], [place], [address] Phone: [phone]   Fax:  [phone]  Physical Therapy Treatment  Patient Details  Name: Pamela Kennedy MRN: [id number] Date of Birth: 1956/05/26 Referring Provider (PT): Dr. Burr Medico   Encounter Date: 05/31/2021   PT End of Session - 05/31/21 1356     Visit Number 7    Number of Visits 13    Date for PT Re-Evaluation 06/16/21    Authorization Type MCR/MCD    PT Start Time 1302    PT Stop Time 1352    PT Time Calculation (min) 50 min    Equipment Utilized During Treatment Gait belt    Activity Tolerance Patient tolerated treatment well    Behavior During Therapy WFL for tasks assessed/performed             Past Medical History:  Diagnosis Date   Depression    Diabetes mellitus without complication ([place])    Family history of breast cancer    Family history of prostate cancer    Overactive bladder     Past Surgical History:  Procedure Laterality Date   CHOLECYSTECTOMY      There were no vitals filed for this visit.   Subjective Assessment - 05/31/21 1302     Subjective Doing well overall. Feel like I am improving with going up and down stairs, and my balance seems to be better.    Pertinent History History of gallbladder surgery for cancer in 2021 with possible stroke occurring.  Rehab after the surgery x 3 weeks then returning to home with sister. Pt had hospitalization in May and is having difficulty recovering her strength. Pt is no longer getting a chemo pill    Limitations Walking;Other (comment)    Patient Stated Goals pt wants to go up and down the stairs by herself    Currently in Pain? Yes    Pain Score 0-No pain                               [facility] Adult PT Treatment/Exercise - 05/31/21 0001       Ambulation/Gait   Stairs Assistance Details (indicate cue type and reason) able to do stairs in peds gym 6 in with  supervision and VC's to slow down and focus on what she is doing. Distracted by children   Stair Management Technique One rail Right    Gait Comments obstacle course of step onto 6" step, cobble board, ax,cobble board, step over weight, ball against wall and dribble and return. Pt able to remember sequence and did better with each repetition of course and was able to repeat 2 times down and back stepping forward, and 1 time down and back stepping laterally.      High Level Balance   High Level Balance Activities Side stepping;Backward walking;Direction changes;Turns;Sudden stops;Marching forwards      Exercises   Other Exercises  1 rounds of rEMOM of 30 second work and 30 second rest : holding 1# in front for standing marches with core stable, 1# of sit to stand, 2 # standing bilateral bicep curls,    front, center, side,center,back cneter, red therand row, 1# reach to black shelf.      Knee/Hip Exercises: Standing   Other Standing Knee Exercises Forward and lateral step ups on ax x 10 ea leg   CGA of PT     Knee/Hip Exercises: Seated  Long Arc [employer] Strengthening;Both;2 sets;10 reps   3 #   Long Arc Quad Weight 3 lbs.    Marching Right;Strengthening;Left;2 sets;15 reps    Marching Limitations 3#                      PT Short Term Goals - 05/05/21 1410       PT SHORT TERM GOAL #1   Title Pt will be independent in a beginning home exercise program for leg strength    Time 3    Period Weeks    Status New               PT Long Term Goals - 05/05/21 1410       PT LONG TERM GOAL #1   Title Pt will be ind with final HEP for continued strength and mobility    Time 6    Period Weeks    Status New      PT LONG TERM GOAL #2   Title Pt will improve BERG by at least 46 points to demonstrate improved balance    Baseline 44 on eval    Time 6    Period Weeks    Status New      PT LONG TERM GOAL #3   Title Pt will decrease TUG to < 9.5 seconds demonstrating improved  functional moblity    Time 6    Period Weeks    Status New      PT LONG TERM GOAL #4   Title Pt will be educated on fall risk strategies for home    Time 6    Period Weeks    Status New                   Plan - 05/31/21 1357     Clinical Impression Statement Continued 30 sec rounds of exercise, balance activities/gait activities in hallway, stair training in peds gym and obstacle course in hallway.  Pt noted to drift to the left multiple times when walking/marching in Hallway.  She has a tendency to lean back and arch her back when performing standing exercises and requires VC's but has difficulty correcting.   She had several LOB but was able to self correct.    Personal Factors and Comorbidities Age;Behavior Pattern;Fitness;Transportation    Examination-Activity Limitations Locomotion Level;Stairs    Examination-Participation Restrictions Meal Prep;Cleaning;Community Activity;Driving;Shop    Stability/Clinical Decision Making Stable/Uncomplicated    Rehab Potential Fair    PT Frequency 2x / week    PT Duration 6 weeks    PT Treatment/Interventions ADLs/Self Care Home Management;Therapeutic exercise;Gait training;Therapeutic activities;Patient/family education    PT Next Visit Plan leg pain better with stockings? continue  general exercise program including gradually progressive resistive exercises, especially for legs, gait and balance training progressing to stair training and fall risk reduction.  Continue circut type of exercises  reassess objective measures    PT Home Exercise Plan ppt, ppt with march and heel slide, standing heel raises, standing hip abd, marching, bridge    Consulted and Agree with Plan of Care Patient             Patient will benefit from skilled therapeutic intervention in order to improve the following deficits and impairments:  Abnormal gait, Decreased knowledge of use of DME, Decreased coordination, Difficulty walking  Visit  Diagnosis: Muscle weakness (generalized)  Recurrent falls  Other abnormalities of gait and mobility     Problem List Patient Active  Problem List   Diagnosis Date Noted   Genetic testing 11/25/2020   Family history of breast cancer    Family history of prostate cancer    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

## 2021-06-05 ENCOUNTER — Ambulatory Visit: Payer: Medicare Other

## 2021-06-05 ENCOUNTER — Other Ambulatory Visit: Payer: Self-pay

## 2021-06-05 DIAGNOSIS — M6281 Muscle weakness (generalized): Secondary | ICD-10-CM

## 2021-06-05 DIAGNOSIS — R2689 Other abnormalities of gait and mobility: Secondary | ICD-10-CM

## 2021-06-05 DIAGNOSIS — C23 Malignant neoplasm of gallbladder: Secondary | ICD-10-CM | POA: Diagnosis not present

## 2021-06-05 DIAGNOSIS — R296 Repeated falls: Secondary | ICD-10-CM

## 2021-06-05 NOTE — Therapy (Signed)
[place], [place], [address] Phone: [phone]   Fax:  [phone]  Physical Therapy Treatment  Patient Details  Name: Pamela Kennedy MRN: [id number] Date of Birth: 08-10-1956 Referring Provider (PT): Dr. Burr Medico   Encounter Date: 06/05/2021   PT End of Session - 06/05/21 1554     Visit Number 8    Number of Visits 13    Date for PT Re-Evaluation 06/16/21    Authorization Type MCR/MCD    PT Start Time 1500    PT Stop Time 1552    PT Time Calculation (min) 52 min    Equipment Utilized During Treatment Gait belt    Activity Tolerance Patient tolerated treatment well    Behavior During Therapy WFL for tasks assessed/performed             Past Medical History:  Diagnosis Date   Depression    Diabetes mellitus without complication ([place])    Family history of breast cancer    Family history of prostate cancer    Overactive bladder     Past Surgical History:  Procedure Laterality Date   CHOLECYSTECTOMY      There were no vitals filed for this visit.   Subjective Assessment - 06/05/21 1501     Subjective No trips or near falls. sometimes going up stairs I go with my right side only because my left leg hurts in the calf.  No present pain.I can get up and down from chairs better without holding the arms.    Patient is accompained by: Family member    Pertinent History History of gallbladder surgery for cancer in 2021 with possible stroke occurring.  Rehab after the surgery x 3 weeks then returning to home with sister. Pt had hospitalization in May and is having difficulty recovering her strength. Pt is no longer getting a chemo pill                               [facility] Adult PT Treatment/Exercise - 06/05/21 0001       Ambulation/Gait   Stairs Assistance 5: Supervision    Stairs Assistance Details (indicate cue type and reason) stairs using right railing up and down 5  times with reciprocal gait    Stair Management Technique One rail Right    Gait Comments obstacle course of step onto 6" step, cobble board, ax,cobble board, step over weight, ball against wall and dribble and return. Pt able to remember sequence and did better with each repetition of course and was able to repeat 2 times down and back stepping forward, and 1 time down and back stepping laterally.      High Level Balance   High Level Balance Activities Side stepping;Backward walking;Direction changes;Turns;Sudden stops;Marching forwards      Exercises   Other Exercises  1 rounds of rEMOM of 30 second work and 30 second rest : holding 2# in front for standing marches with core stable, 2# of sit to stand, 2 # standing bilateral bicep curls,    front, SLS center, side,center,back cneter, red therand row, 1# reach to black shelf.      Knee/Hip Exercises: Standing   Forward Step Up Right;Left;1 set;10 reps   partial step ups 6 in   Other Standing Knee Exercises mini squats x10      Knee/Hip Exercises: Seated   Long Arc Quad Strengthening;Both;2 sets;10 reps   3 #  Long Arc Quad Weight 3 lbs.                      PT Short Term Goals - 05/05/21 1410       PT SHORT TERM GOAL #1   Title Pt will be independent in a beginning home exercise program for leg strength    Time 3    Period Weeks    Status New               PT Long Term Goals - 05/05/21 1410       PT LONG TERM GOAL #1   Title Pt will be ind with final HEP for continued strength and mobility    Time 6    Period Weeks    Status New      PT LONG TERM GOAL #2   Title Pt will improve BERG by at least 46 points to demonstrate improved balance    Baseline 44 on eval    Time 6    Period Weeks    Status New      PT LONG TERM GOAL #3   Title Pt will decrease TUG to < 9.5 seconds demonstrating improved functional moblity    Time 6    Period Weeks    Status New      PT LONG TERM GOAL #4   Title Pt will be  educated on fall risk strategies for home    Time 6    Period Weeks    Status New                   Plan - 06/05/21 1554     Clinical Impression Statement pt improved today with not arching back during exercises but still observed to drift to left with gait activities in the hallway.  She had some difficulty doing obstacle course with lateral stepping and was not allowing enough room to get her opposite leg on.  She requires VC's to slow down in order to gain control at times.  She can lose focus easily if other people are nearby    Personal Factors and Comorbidities Age;Behavior Pattern;Fitness;Transportation    Examination-Activity Limitations Locomotion Level;Stairs    Examination-Participation Restrictions Meal Prep;Cleaning;Community Activity;Driving;Shop    Stability/Clinical Decision Making Stable/Uncomplicated    Rehab Potential Fair    PT Frequency 2x / week    PT Duration 6 weeks    PT Treatment/Interventions ADLs/Self Care Home Management;Therapeutic exercise;Gait training;Therapeutic activities;Patient/family education    PT Next Visit Plan leg pain better with stockings? continue  general exercise program including gradually progressive resistive exercises, especially for legs, gait and balance training progressing to stair training and fall risk reduction.  Continue circut type of exercises  reassess objective measures    PT Home Exercise Plan ppt, ppt with march and heel slide, standing heel raises, standing hip abd, marching, bridge    Consulted and Agree with Plan of Care Patient    Family Member Consulted sister             Patient will benefit from skilled therapeutic intervention in order to improve the following deficits and impairments:  Abnormal gait, Decreased knowledge of use of DME, Decreased coordination, Difficulty walking  Visit Diagnosis: Muscle weakness (generalized)  Recurrent falls  Other abnormalities of gait and mobility     Problem  List Patient Active Problem List   Diagnosis Date Noted   Genetic testing 11/25/2020   Family history of breast  cancer    Family history of prostate cancer    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

## 2021-06-08 ENCOUNTER — Ambulatory Visit: Payer: Medicare Other

## 2021-06-08 ENCOUNTER — Other Ambulatory Visit: Payer: Self-pay

## 2021-06-08 DIAGNOSIS — Z9221 Personal history of antineoplastic chemotherapy: Secondary | ICD-10-CM | POA: Diagnosis not present

## 2021-06-08 DIAGNOSIS — E1165 Type 2 diabetes mellitus with hyperglycemia: Secondary | ICD-10-CM | POA: Diagnosis not present

## 2021-06-08 DIAGNOSIS — Z9181 History of falling: Secondary | ICD-10-CM | POA: Diagnosis not present

## 2021-06-08 DIAGNOSIS — M6281 Muscle weakness (generalized): Secondary | ICD-10-CM

## 2021-06-08 DIAGNOSIS — Z7901 Long term (current) use of anticoagulants: Secondary | ICD-10-CM | POA: Diagnosis not present

## 2021-06-08 DIAGNOSIS — R5383 Other fatigue: Secondary | ICD-10-CM | POA: Diagnosis not present

## 2021-06-08 DIAGNOSIS — D509 Iron deficiency anemia, unspecified: Secondary | ICD-10-CM | POA: Diagnosis not present

## 2021-06-08 DIAGNOSIS — I82542 Chronic embolism and thrombosis of left tibial vein: Secondary | ICD-10-CM | POA: Diagnosis present

## 2021-06-08 DIAGNOSIS — F32A Depression, unspecified: Secondary | ICD-10-CM | POA: Diagnosis not present

## 2021-06-08 DIAGNOSIS — Z7984 Long term (current) use of oral hypoglycemic drugs: Secondary | ICD-10-CM | POA: Diagnosis not present

## 2021-06-08 DIAGNOSIS — R2689 Other abnormalities of gait and mobility: Secondary | ICD-10-CM | POA: Insufficient documentation

## 2021-06-08 DIAGNOSIS — D563 Thalassemia minor: Secondary | ICD-10-CM | POA: Diagnosis not present

## 2021-06-08 DIAGNOSIS — R296 Repeated falls: Secondary | ICD-10-CM

## 2021-06-08 DIAGNOSIS — Z87891 Personal history of nicotine dependence: Secondary | ICD-10-CM | POA: Diagnosis not present

## 2021-06-08 DIAGNOSIS — Z79899 Other long term (current) drug therapy: Secondary | ICD-10-CM | POA: Diagnosis not present

## 2021-06-08 DIAGNOSIS — R4781 Slurred speech: Secondary | ICD-10-CM | POA: Diagnosis not present

## 2021-06-08 DIAGNOSIS — C23 Malignant neoplasm of gallbladder: Secondary | ICD-10-CM | POA: Diagnosis present

## 2021-06-08 NOTE — Therapy (Signed)
[place], [place], [address] Phone: [phone]   Fax:  [phone]  Physical Therapy Treatment  Patient Details  Name: Pamela Kennedy MRN: [id number] Date of Birth: [birth year] Referring Provider (PT): Dr. Burr Medico   Encounter Date: 06/08/2021   PT End of Session - 06/08/21 1721     Visit Number 9    Number of Visits 13    Date for PT Re-Evaluation 06/16/21    Authorization Type MCR/MCD    PT Start Time 1315    PT Stop Time [id number]    PT Time Calculation (min) 40 min             Past Medical History:  Diagnosis Date   Depression    Diabetes mellitus without complication ([place])    Family history of breast cancer    Family history of prostate cancer    Overactive bladder     Past Surgical History:  Procedure Laterality Date   CHOLECYSTECTOMY      There were no vitals filed for this visit.   Subjective Assessment - 06/08/21 1318     Subjective Did fine after last visit.  I don't have my compression stockings on now, but they do help my leg pain. 15 min late   Pertinent History History of gallbladder surgery for cancer in 2021 with possible stroke occurring.  Rehab after the surgery x 3 weeks then returning to home with sister. Pt had hospitalization in May and is having difficulty recovering her strength. Pt is no longer getting a chemo pill    Limitations Walking;Other (comment)    Patient Stated Goals pt wants to go up and down the stairs by herself    Currently in Pain? No/denies    Pain Score 0-No pain    Multiple Pain Sites No                               [facility] Adult PT Treatment/Exercise - 06/08/21 0001       Exercises   Other Exercises  sitting on ball;boucing, alternate arm raises ,pelvic circles, heel taps forward, alternate hip abduction, pelvic circles.      Knee/Hip Exercises: Standing   Lateral Step Up Right;Left;5 reps;2 sets    Forward Step Up  Right;Left;1 set;5 sets   8 in step   Rocker Board 1 minute   stretching gastrocs     Knee/Hip Exercises: Seated   Other Seated Knee/Hip Exercises recumbent bike  2:40 sec   stopped due to fatigue   Sit to Sand 2 sets;5 reps;with UE support      Shoulder Exercises: Standing   Extension Strengthening;Both;10 reps    Theraband Level (Shoulder Extension) Level 2 (Red)    Retraction Strengthening;Both;10 reps    Theraband Level (Shoulder Retraction) Level 2 (Red)    Other Standing Exercises Jobes flex and scaption 1# x 10 ea                      PT Short Term Goals - 05/05/21 1410       PT SHORT TERM GOAL #1   Title Pt will be independent in a beginning home exercise program for leg and generalized  strength    Time 3    Period Weeks    Status New               PT Long Term Goals - 05/05/21  [place] #1   Title Pt will be ind with final HEP for continued strength and mobility    Time 6    Period Weeks    Status New      PT LONG TERM GOAL #2   Title Pt will improve BERG by at least 46 points to demonstrate improved balance    Baseline 44 on eval    Time 6    Period Weeks    Status New      PT LONG TERM GOAL #3   Title Pt will decrease TUG to < 9.5 seconds demonstrating improved functional moblity    Time 6    Period Weeks    Status New      PT LONG TERM GOAL #4   Title Pt will be educated on fall risk strategies for home    Time 6    Period Weeks    Status New                   Plan - 06/08/21 1717     Clinical Impression Statement pt rode the bike at her request and she performed some balance/stability exercises while sitting on the large physioball with therapist behind for support, and some standing LE strengthening activities.  She did well on the ball with lower level exercises but did need occasional CGA for mild LOB.  She fatigued very quickly with the recumbent bike lasting only 2 min 40 seconds due to leg  fatigue.  She is improving with steps ps and did 8 in step ups today with good form and control.    Personal Factors and Comorbidities Age;Behavior Pattern;Fitness;Transportation    Examination-Activity Limitations Locomotion Level;Stairs    Examination-Participation Restrictions Meal Prep;Cleaning;Community Activity;Driving;Shop    Stability/Clinical Decision Making Stable/Uncomplicated    Rehab Potential Fair    PT Frequency 2x / week    PT Duration 6 weeks    PT Treatment/Interventions ADLs/Self Care Home Management;Therapeutic exercise;Gait training;Therapeutic activities;Patient/family education    PT Next Visit Plan continue  general exercise program including gradually progressive resistive exercises, especially for legs, gait and balance training progressing to stair training and fall risk reduction.  Continue circut type of exercises  reassess objective measures    PT Home Exercise Plan ppt, ppt with march and heel slide, standing heel raises, standing hip abd, marching, bridge    Consulted and Agree with Plan of Care Patient             Patient will benefit from skilled therapeutic intervention in order to improve the following deficits and impairments:  Abnormal gait, Decreased knowledge of use of DME, Decreased coordination, Difficulty walking  Visit Diagnosis: Muscle weakness (generalized)  Recurrent falls  Other abnormalities of gait and mobility     Problem List Patient Active Problem List   Diagnosis Date Noted   Genetic testing 11/25/2020   Family history of breast cancer    Family history of prostate cancer    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

## 2021-06-19 ENCOUNTER — Ambulatory Visit: Payer: Medicare Other

## 2021-06-19 ENCOUNTER — Other Ambulatory Visit: Payer: Self-pay

## 2021-06-19 DIAGNOSIS — R2689 Other abnormalities of gait and mobility: Secondary | ICD-10-CM

## 2021-06-19 DIAGNOSIS — I82542 Chronic embolism and thrombosis of left tibial vein: Secondary | ICD-10-CM | POA: Diagnosis not present

## 2021-06-19 DIAGNOSIS — M6281 Muscle weakness (generalized): Secondary | ICD-10-CM

## 2021-06-19 DIAGNOSIS — R296 Repeated falls: Secondary | ICD-10-CM

## 2021-06-19 NOTE — Therapy (Signed)
[place], [place], [address] Phone: [phone]   Fax:  [phone]  Physical Therapy Treatment  Patient Details  Name: Pamela Kennedy MRN: [id number] Date of Birth: 24-Feb-1956 Referring Provider (PT): Dr. Burr Medico   Encounter Date: 06/19/2021   PT End of Session - 06/19/21 1324     Visit Number 10    Number of Visits 21    Date for PT Re-Evaluation 07/17/21    Authorization Type MCR/MCD    PT Start Time 1307    PT Stop Time 1402    PT Time Calculation (min) 55 min    Activity Tolerance Patient tolerated treatment well    Behavior During Therapy [facility] for tasks assessed/performed             Past Medical History:  Diagnosis Date   Depression    Diabetes mellitus without complication ([place])    Family history of breast cancer    Family history of prostate cancer    Overactive bladder     Past Surgical History:  Procedure Laterality Date   CHOLECYSTECTOMY      There were no vitals filed for this visit.   Subjective Assessment - 06/19/21 1310     Subjective I can tell my legs are getting stronger but my Lt leg isn't getting as strong as my right leg as quickly.    Pertinent History History of gallbladder surgery for cancer in 2021 with possible stroke occurring.  Rehab after the surgery x 3 weeks then returning to home with sister. Pt had hospitalization in May and is having difficulty recovering her strength. Pt is no longer getting a chemo pill    Patient Stated Goals pt wants to go up and down the stairs by herself    Currently in Pain? No/denies                [facility] PT Assessment - 06/19/21 0001       Berg Balance Test   Sit to Stand Able to stand without using hands and stabilize independently    Standing Unsupported Able to stand safely 2 minutes    Sitting with Back Unsupported but Feet Supported on Floor or Stool Able to sit safely and securely 2 minutes    Stand to  Sit Sits safely with minimal use of hands    Transfers Able to transfer safely, minor use of hands    Standing Unsupported with Eyes Closed Able to stand 10 seconds safely    Standing Unsupported with Feet Together Able to place feet together independently and stand 1 minute safely    From Standing, Reach Forward with Outstretched Arm Can reach confidently >25 cm (10")    From Standing Position, Pick up Object from Floor Able to pick up shoe safely and easily    From Standing Position, Turn to Look Behind Over each Shoulder Looks behind one side only/other side shows less weight shift    Turn 360 Degrees Able to turn 360 degrees safely in 4 seconds or less    Standing Unsupported, Alternately Place Feet on Step/Stool Able to stand independently and safely and complete 8 steps in 20 seconds    Standing Unsupported, One Foot in Front Able to plae foot ahead of the other independently and hold 30 seconds    Standing on One Leg Able to lift leg independently and hold equal to or more than 3 seconds    Total Score 52  Timed Up and Go Test   Normal TUG (seconds) 8.57                           [facility] Adult PT Treatment/Exercise - 06/19/21 0001       Neuro Re-ed    Neuro Re-ed Details  In // bars: Heel-toe walking with fingertip support front and then retro; slow and controlled high knee marching holding each step for 3 sec 2x each      Knee/Hip Exercises: Aerobic   Nustep Level 4 x 5 mins with PTA montioring pt throughout      Knee/Hip Exercises: Standing   Heel Raises Both;10 reps   in // bars then showed pt SLS for increased difficulty at home later   Hip Flexion Stengthening;Right;Left;10 reps   in // bars with VCs to keep swinging leg off ground for SLS on contralateral LE   Hip Abduction Stengthening;Right;Left;10 reps   in // bars with demo and VCs for correct technique   Hip Extension Stengthening;Right;Left;10 reps   in // bars   Extension Limitations tactile and  VCs for correct LE technqiue (pt was bending knee during) and to not allow leg to swing forward using momentum    Forward Step Up Right;Left;10 reps;Hand Hold: 0;Step Height: 6"    Forward Step Up Limitations a few mild LOB but able to self correct                     PT Education - 06/19/21 1404     Education Details Standing hip 3 way raises and balance activities    Person(s) Educated Patient    Methods Explanation;Demonstration;Handout    Comprehension Verbalized understanding;Returned demonstration;Verbal cues required;Need further instruction                    PT Long Term Goals - 06/19/21 1325       PT LONG TERM GOAL #1   Title Pt will be ind with final HEP for continued strength and mobility    Baseline Pt reports doing her current HEP daily, up to 3-4x/day-06/19/21    Status Partially Met      PT LONG TERM GOAL #2   Title Pt will improve BERG by at least 46 points to demonstrate improved balance    Baseline 44 on eval; 52 - 06/19/21    Status Achieved      PT LONG TERM GOAL #3   Title Pt will decrease TUG to < 9.5 seconds demonstrating improved functional moblity    Baseline TUG 8.57 sec    Status Achieved      PT LONG TERM GOAL #4   Title Pt will be educated on fall risk strategies for home    Baseline Pt reports feeling better educated about this now-06/19/21    Status Achieved      PT LONG TERM GOAL #5   Title Pt will be able to alternate up stairs 75% of the time due to improved Lt LE strength.    Baseline pt reports able to do this only 50% of the time-06/19/21    Time 4    Period Weeks    Status New      Additional Long Term Goals   Additional Long Term Goals Yes      PT LONG TERM GOAL #6   Title Pt will report being able to ambulate for up to 30 mins at a time for  improved community ambulation for errands (i.e. grocery store).    Baseline pt reports only able to ambulate about 15 mins before needing seated rest-06/19/21    Time 4     Period Weeks    Status New                   Plan - 06/19/21 1405     Clinical Impression Statement Reassessed pts current functional statustoday for goal assess and renewal. Pt has made excellent progress with improving her BERG balance test from 44 to 52 and her TUG improved by >2 sec meeting both goals. She is independnet with her current HEP but progressed this today as she is able to tolerate and will benefit from further progression as she is able to stand longer now and her balance continues improving. Also added NuStep which pt was able to tolerate for 6 mins for first time. Overall pt reports she notices her legs are getting stronger but she does still struggle with alternating up stairs, especially when she is fatigued and she is limited to ambulating to about 15 mins so added 2 new goals to adress both of these deficits. Pt will benefit from continued physical therapy at this time to progress towards mentioned deficits.    Personal Factors and Comorbidities Age;Behavior Pattern;Fitness;Transportation    Examination-Activity Limitations Locomotion Level;Stairs    Examination-Participation Restrictions Meal Prep;Cleaning;Community Activity;Driving;Shop    Stability/Clinical Decision Making Stable/Uncomplicated    Rehab Potential Fair    PT Frequency 2x / week    PT Duration 4 weeks    PT Treatment/Interventions ADLs/Self Care Home Management;Therapeutic exercise;Gait training;Therapeutic activities;Patient/family education    PT Next Visit Plan Renewal done this session; try leg press other resisted machines; continue general exercise program including gradually progressive resistive exercises, especially for legs, gait and balance training progressing to stair training; Continue circut type of exercises  reassess objective measures    PT Home Exercise Plan ppt, ppt with march and heel slide, standing heel raises, standing hip abd, marching, bridge; standing hip 3 way raises and  static balance activities    Consulted and Agree with Plan of Care Patient             Patient will benefit from skilled therapeutic intervention in order to improve the following deficits and impairments:  Abnormal gait, Decreased knowledge of use of DME, Decreased coordination, Difficulty walking  Visit Diagnosis: Muscle weakness (generalized)  Recurrent falls  Other abnormalities of gait and mobility     Problem List Patient Active Problem List   Diagnosis Date Noted   Genetic testing 11/25/2020   Family history of breast cancer    Family history of prostate cancer    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

## 2021-06-19 NOTE — Patient Instructions (Signed)
Cancer Rehab [phone] HIP: Flexion Standing    Holding onto counter lift leg straight in front keeping knee straight. Slow and controlled! _10__ reps per set, _2-3__ sets per day. Then repeat with other leg.   Hip Extension (Standing)    Stand with support at counter. Squeeze pelvic floor and hold so as not to twist hips and don't lean forward.  Move right leg backward with straight knee. Slow and controlled! Repeat _10__ times. Do _2-3__ times a day. Repeat with other leg.  Hip Abduction (Standing)    Stand with support. Squeeze pelvic floor and hold. Lift right leg out to side, keeping toe forward.  Repeat _10__ times. Do _2-3__ times a day. Repeat with other leg.    Heel Raise: Bilateral (Standing)      Stand near counter for fingertip support if needed. Rise on balls of feet. Repeat __10-20__ times per set. Do _1-2___ sets per session. Do __2__ sessions per day.  SINGLE LIMB STANCE    Stand at counter (corner if you have one) for minimal arm support. Raise leg. Hold _10-20__ seconds. Repeat with other leg. Once this becomes easier, for increased challenge. _3-5__ reps per set, _2-3__ sets per day.  Tandem Stance    Stand at counter (corner if you have one). Right foot in front of left, heel touching toe both feet "straight ahead". Stand on Foot Triangle of Support with both feet. Balance in this position _10-20__ seconds. Do with left foot in front of right. Once this becomes easier, for increased challenge, close eyes with fingertips on counter for safety.  Step-Up: Forward    Move step-stool to counter or hold rail if at steps, but as little as able. Step up forward keeping opposite foot off step. Keep pelvis level and back straight. Do _10__ times, do _1-2_ sets, on each leg, _1-2__ times per day.

## 2021-06-21 ENCOUNTER — Ambulatory Visit: Payer: Medicare Other

## 2021-06-21 ENCOUNTER — Other Ambulatory Visit: Payer: Self-pay

## 2021-06-21 DIAGNOSIS — M6281 Muscle weakness (generalized): Secondary | ICD-10-CM

## 2021-06-21 DIAGNOSIS — I82542 Chronic embolism and thrombosis of left tibial vein: Secondary | ICD-10-CM | POA: Diagnosis not present

## 2021-06-21 DIAGNOSIS — R296 Repeated falls: Secondary | ICD-10-CM

## 2021-06-21 DIAGNOSIS — R2689 Other abnormalities of gait and mobility: Secondary | ICD-10-CM

## 2021-06-21 NOTE — Therapy (Signed)
[place], [place], [address] Phone: [phone]   Fax:  [phone]  Physical Therapy Treatment  Patient Details  Name: Pamela Kennedy MRN: [id number] Date of Birth: 04-13-1956 Referring Provider (PT): Dr. Burr Medico   Encounter Date: 06/21/2021   PT End of Session - 06/21/21 1322     Visit Number 11    Number of Visits 21    Date for PT Re-Evaluation 07/17/21    Authorization Type MCR/MCD    PT Start Time 1062    PT Stop Time 1358    PT Time Calculation (min) 45 min    Equipment Utilized During Treatment Gait belt    Activity Tolerance Patient tolerated treatment well    Behavior During Therapy WFL for tasks assessed/performed             Past Medical History:  Diagnosis Date   Depression    Diabetes mellitus without complication ([place])    Family history of breast cancer    Family history of prostate cancer    Overactive bladder     Past Surgical History:  Procedure Laterality Date   CHOLECYSTECTOMY      There were no vitals filed for this visit.   Subjective Assessment - 06/21/21 1313     Subjective I have been exercising and walking. I walked around the block around 7 times. doing some better on stairs but still put most wt. On right leg   Pertinent History History of gallbladder surgery for cancer in 2021 with possible stroke occurring.  Rehab after the surgery x 3 weeks then returning to home with sister. Pt had hospitalization in May and is having difficulty recovering her strength. Pt is no longer getting a chemo pill    Patient Stated Goals pt wants to go up and down the stairs by herself    Currently in Pain? No/denies    Pain Score 0-No pain                               [facility] Adult PT Treatment/Exercise - 06/21/21 0001       Knee/Hip Exercises: Aerobic   Recumbent Bike 5 lev 1      Knee/Hip Exercises: Standing   Hip Flexion  Stengthening;Right;Left;1 set;10 reps;Knee bent   2# each leg   Hip Abduction Stengthening;Right;Left;1 set;10 reps;Knee straight   2#, VC to keep foot straight.   Lateral Step Up Right;Left;1 set;10 reps;Hand Hold: 1;Step Height: 6"    Forward Step Up Right;Left;1 set;10 reps;Hand Hold: 1;Step Height: 8"    Rocker Board 1 minute   stretching gastrocs in 10 sec holds   Walking with [employer] lateral band walks and monster walks 3 x 10 steps both directions      Shoulder Exercises: Standing   Other Standing Exercises jobes flexion 1# x 10    Other Standing Exercises elbow  flexion 3# 2 x10                       PT Short Term Goals - 05/05/21 1410       PT SHORT TERM GOAL #1   Title Pt will be independent in a beginning home exercise program for leg strength    Time 3    Period Weeks    Status New               PT Long Term Goals -  06/19/21 1325       PT LONG TERM GOAL #1   Title Pt will be ind with final HEP for continued strength and mobility    Baseline Pt reports doing her current HEP daily, up to 3-4x/day-06/19/21    Status Partially Met      PT LONG TERM GOAL #2   Title Pt will improve BERG by at least 46 points to demonstrate improved balance    Baseline 44 on eval; 52 - 06/19/21    Status Achieved      PT LONG TERM GOAL #3   Title Pt will decrease TUG to < 9.5 seconds demonstrating improved functional moblity    Baseline TUG 8.57 sec    Status Achieved      PT LONG TERM GOAL #4   Title Pt will be educated on fall risk strategies for home    Baseline Pt reports feeling better educated about this now-06/19/21    Status Achieved      PT LONG TERM GOAL #5   Title Pt will be able to alternate up stairs 75% of the time due to improved Lt LE strength.    Baseline pt reports able to do this only 50% of the time-06/19/21    Time 4    Period Weeks    Status New      Additional Long Term Goals   Additional Long Term Goals Yes      PT LONG TERM GOAL  #6   Title Pt will report being able to ambulate for up to 30 mins at a time for improved community ambulation for errands (i.e. grocery store).    Baseline pt reports only able to ambulate about 15 mins before needing seated rest-06/19/21    Time 4    Period Weeks    Status New                   Plan - 06/21/21 1357     Clinical Impression Statement Pt was late getting here today.  She did well with exercises but did feel more tired than usual in her legs.  She required intermittent rest breaks between exercises. She found band walks the most challenging but did well once she got the form down. Therapist held gait belt during band walks    Personal Factors and Comorbidities Age;Behavior Pattern;Fitness;Transportation    Examination-Activity Limitations Locomotion Level;Stairs    Examination-Participation Restrictions Meal Prep;Cleaning;Community Activity;Driving;Shop    Stability/Clinical Decision Making Stable/Uncomplicated    Rehab Potential Fair    PT Frequency 2x / week    PT Duration 4 weeks    PT Treatment/Interventions ADLs/Self Care Home Management;Therapeutic exercise;Gait training;Therapeutic activities;Patient/family education    PT Next Visit Plan ; try leg press other resisted machines; continue general exercise program including gradually progressive resistive exercises, especially for legs, gait and balance training progressing to stair training; Continue circut type of exercises  reassess objective measures    PT Home Exercise Plan ppt, ppt with march and heel slide, standing heel raises, standing hip abd, marching, bridge; standing hip 3 way raises and static balance activities    Consulted and Agree with Plan of Care Patient             Patient will benefit from skilled therapeutic intervention in order to improve the following deficits and impairments:  Abnormal gait, Decreased knowledge of use of DME, Decreased coordination, Difficulty walking, Decreased  strength  Visit Diagnosis: Muscle weakness (generalized)  Recurrent falls  Other  abnormalities of gait and mobility     Problem List Patient Active Problem List   Diagnosis Date Noted   Genetic testing 11/25/2020   Family history of breast cancer    Family history of prostate cancer    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

## 2021-06-26 ENCOUNTER — Inpatient Hospital Stay: Payer: Medicare Other | Attending: Hematology

## 2021-06-26 ENCOUNTER — Other Ambulatory Visit: Payer: Self-pay

## 2021-06-26 ENCOUNTER — Ambulatory Visit: Payer: Medicare Other

## 2021-06-26 DIAGNOSIS — R296 Repeated falls: Secondary | ICD-10-CM

## 2021-06-26 DIAGNOSIS — D649 Anemia, unspecified: Secondary | ICD-10-CM

## 2021-06-26 DIAGNOSIS — Z79899 Other long term (current) drug therapy: Secondary | ICD-10-CM | POA: Insufficient documentation

## 2021-06-26 DIAGNOSIS — Z9221 Personal history of antineoplastic chemotherapy: Secondary | ICD-10-CM | POA: Insufficient documentation

## 2021-06-26 DIAGNOSIS — D509 Iron deficiency anemia, unspecified: Secondary | ICD-10-CM | POA: Insufficient documentation

## 2021-06-26 DIAGNOSIS — Z9181 History of falling: Secondary | ICD-10-CM | POA: Insufficient documentation

## 2021-06-26 DIAGNOSIS — R2689 Other abnormalities of gait and mobility: Secondary | ICD-10-CM

## 2021-06-26 DIAGNOSIS — C23 Malignant neoplasm of gallbladder: Secondary | ICD-10-CM | POA: Insufficient documentation

## 2021-06-26 DIAGNOSIS — R5383 Other fatigue: Secondary | ICD-10-CM | POA: Insufficient documentation

## 2021-06-26 DIAGNOSIS — D563 Thalassemia minor: Secondary | ICD-10-CM | POA: Insufficient documentation

## 2021-06-26 DIAGNOSIS — Z7984 Long term (current) use of oral hypoglycemic drugs: Secondary | ICD-10-CM | POA: Insufficient documentation

## 2021-06-26 DIAGNOSIS — F32A Depression, unspecified: Secondary | ICD-10-CM | POA: Insufficient documentation

## 2021-06-26 DIAGNOSIS — E1165 Type 2 diabetes mellitus with hyperglycemia: Secondary | ICD-10-CM | POA: Insufficient documentation

## 2021-06-26 DIAGNOSIS — M6281 Muscle weakness (generalized): Secondary | ICD-10-CM

## 2021-06-26 DIAGNOSIS — Z7901 Long term (current) use of anticoagulants: Secondary | ICD-10-CM | POA: Insufficient documentation

## 2021-06-26 DIAGNOSIS — I82542 Chronic embolism and thrombosis of left tibial vein: Secondary | ICD-10-CM | POA: Diagnosis not present

## 2021-06-26 DIAGNOSIS — Z87891 Personal history of nicotine dependence: Secondary | ICD-10-CM | POA: Insufficient documentation

## 2021-06-26 DIAGNOSIS — R4781 Slurred speech: Secondary | ICD-10-CM | POA: Insufficient documentation

## 2021-06-26 LAB — CBC WITH DIFFERENTIAL (CANCER CENTER ONLY)
Abs Immature Granulocytes: 0.02 10*3/uL (ref 0.00–0.07)
Basophils Absolute: 0.1 10*3/uL (ref 0.0–0.1)
Basophils Relative: 1 %
Eosinophils Absolute: 0.1 10*3/uL (ref 0.0–0.5)
Eosinophils Relative: 1 %
HCT: 39.7 % (ref 36.0–46.0)
Hemoglobin: 11.5 g/dL — ABNORMAL LOW (ref 12.0–15.0)
Immature Granulocytes: 0 %
Lymphocytes Relative: 27 %
Lymphs Abs: 2.2 10*3/uL (ref 0.7–4.0)
MCH: 20.1 pg — ABNORMAL LOW (ref 26.0–34.0)
MCHC: 29 g/dL — ABNORMAL LOW (ref 30.0–36.0)
MCV: 69.3 fL — ABNORMAL LOW (ref 80.0–100.0)
Monocytes Absolute: 0.5 10*3/uL (ref 0.1–1.0)
Monocytes Relative: 6 %
Neutro Abs: 5.3 10*3/uL (ref 1.7–7.7)
Neutrophils Relative %: 65 %
Platelet Count: 293 10*3/uL (ref 150–400)
RBC: 5.73 MIL/uL — ABNORMAL HIGH (ref 3.87–5.11)
RDW: 18.2 % — ABNORMAL HIGH (ref 11.5–15.5)
WBC Count: 8.1 10*3/uL (ref 4.0–10.5)
nRBC: 0 % (ref 0.0–0.2)

## 2021-06-26 LAB — FERRITIN: Ferritin: 16 ng/mL (ref 11–307)

## 2021-06-26 LAB — CMP (CANCER CENTER ONLY)
ALT: 12 U/L (ref 0–44)
AST: 13 U/L — ABNORMAL LOW (ref 15–41)
Albumin: 3.9 g/dL (ref 3.5–5.0)
Alkaline Phosphatase: 136 U/L — ABNORMAL HIGH (ref 38–126)
Anion gap: 11 (ref 5–15)
BUN: 12 mg/dL (ref 8–23)
CO2: 25 mmol/L (ref 22–32)
Calcium: 9.9 mg/dL (ref 8.9–10.3)
Chloride: 107 mmol/L (ref 98–111)
Creatinine: 0.77 mg/dL (ref 0.44–1.00)
GFR, Estimated: >60 mL/min (ref >60)
Glucose, Bld: 94 mg/dL (ref 70–99)
Potassium: 4.3 mmol/L (ref 3.5–5.1)
Sodium: 143 mmol/L (ref 135–145)
Total Bilirubin: 0.2 mg/dL — ABNORMAL LOW (ref 0.3–1.2)
Total Protein: 7.5 g/dL (ref 6.5–8.1)

## 2021-06-26 NOTE — Therapy (Signed)
[place], [place], [address] Phone: [phone]   Fax:  [phone]  Physical Therapy Treatment  Patient Details  Name: Pamela Kennedy MRN: [id number] Date of Birth: 01-18-1956 Referring Provider (PT): Dr. Burr Medico   Encounter Date: 06/26/2021   PT End of Session - 06/26/21 1152   Visit number 12  Number of Visits 21    Date for PT Re-Evaluation 07/17/21    Authorization Type MCR/MCD    PT Start Time 1103    PT Stop Time 1150    PT Time Calculation (min) 47 min    Equipment Utilized During Treatment Gait belt    Activity Tolerance Patient tolerated treatment well    Behavior During Therapy WFL for tasks assessed/performed             Past Medical History:  Diagnosis Date   Depression    Diabetes mellitus without complication ([place])    Family history of breast cancer    Family history of prostate cancer    Overactive bladder     Past Surgical History:  Procedure Laterality Date   CHOLECYSTECTOMY      There were no vitals filed for this visit.   Subjective Assessment - 06/26/21 1104     Subjective I am doing better.  Doing stairs better, doing exercises most days. I've been walking a little bit.  Still staying with my sister but theres a lot of traffic.  I can walk better from my own home in [place].    Pertinent History History of gallbladder surgery for cancer in 2021 with possible stroke occurring.  Rehab after the surgery x 3 weeks then returning to home with sister. Pt had hospitalization in May and is having difficulty recovering her strength. Pt is no longer getting a chemo pill    Limitations Walking;Other (comment)    Patient Stated Goals pt wants to go up and down the stairs by herself    Currently in Pain? No/denies    Pain Score 0-No pain                               [facility] Adult PT Treatment/Exercise - 06/26/21 0001       Ambulation/Gait   Gait  Comments obstacle course of step onto 8" step, cobble board, air ex, 6 in step , cobbleboard. Pt  did well with each repetition of course and was able to repeat 2 times down and back stepping forward, and 1 time down and back stepping laterally without LOB, also did serpentine arond obstacles with no LOB      High Level Balance   High Level Balance Activities    High Level Balance Comments side stepping, marching, carioca, tandem stance down and back short hall. CGA PT      Exercises   Other Exercises  1 round of remom: wall slides x 1 min with 5 sec hold, biceps curls 3#, sit to stand,standing march with 3# in front for core stab, 2# reach to black shelf all with 1 min work and 30 sec rest.      Knee/Hip Exercises: Aerobic   Recumbent Bike 6 min lev 1      Knee/Hip Exercises: Standing   Forward Step Up Right;Left;1 set;10 reps;Hand Hold: 1;Step Height: 8"                       PT Short Term  Goals - 05/05/21 1410       PT SHORT TERM GOAL #1   Title Pt will be independent in a beginning home exercise program for leg strength    Time 3    Period Weeks    Status New               PT Long Term Goals - 06/19/21 1325       PT LONG TERM GOAL #1   Title Pt will be ind with final HEP for continued strength and mobility    Baseline Pt reports doing her current HEP daily, up to 3-4x/day-06/19/21    Status Partially Met      PT LONG TERM GOAL #2   Title Pt will improve BERG by at least 46 points to demonstrate improved balance    Baseline 44 on eval; 52 - 06/19/21    Status Achieved      PT LONG TERM GOAL #3   Title Pt will decrease TUG to < 9.5 seconds demonstrating improved functional moblity    Baseline TUG 8.57 sec    Status Achieved      PT LONG TERM GOAL #4   Title Pt will be educated on fall risk strategies for home    Baseline Pt reports feeling better educated about this now-06/19/21    Status Achieved      PT LONG TERM GOAL #5   Title Pt will be able  to alternate up stairs 75% of the time due to improved Lt LE strength.    Baseline pt reports able to do this only 50% of the time-06/19/21    Time 4    Period Weeks    Status New      Additional Long Term Goals   Additional Long Term Goals Yes      PT LONG TERM GOAL #6   Title Pt will report being able to ambulate for up to 30 mins at a time for improved community ambulation for errands (i.e. grocery store).    Baseline pt reports only able to ambulate about 15 mins before needing seated rest-06/19/21    Time 4    Period Weeks    Status New                   Plan - 06/26/21 1153     Clinical Impression Statement REsumed circuit but extended work time to 1 min ,rest time of  30 seconds.  Continued obstacle course with CGA of PT, and gait activities in hallway.  Pt did very well with obstacle course with no LOB.  She fatigued with longer period of work time with remoms.  Gait training at the end was most challenging with pt requiring occasional assist of PT with LOB especially with carioca step and tandem walking.    Personal Factors and Comorbidities Age;Behavior Pattern;Fitness;Transportation    Examination-Activity Limitations Locomotion Level;Stairs    Examination-Participation Restrictions Meal Prep;Cleaning;Community Activity;Driving;Shop    Stability/Clinical Decision Making Stable/Uncomplicated    Rehab Potential Fair    PT Frequency 2x / week    PT Duration 4 weeks    PT Treatment/Interventions ADLs/Self Care Home Management;Therapeutic exercise;Gait training;Therapeutic activities;Patient/family education    PT Next Visit Plan ; try leg press other resisted machines; continue general exercise program including gradually progressive resistive exercises, especially for legs, gait and balance training progressing to stair training; Continue circut type of exercises  reassess objective measures    PT Home Exercise Plan ppt, ppt with  march and heel slide, standing heel  raises, standing hip abd, marching, bridge; standing hip 3 way raises and static balance activities    Consulted and Agree with Plan of Care Patient             Patient will benefit from skilled therapeutic intervention in order to improve the following deficits and impairments:  Abnormal gait, Decreased knowledge of use of DME, Decreased coordination, Difficulty walking, Decreased strength  Visit Diagnosis: Muscle weakness (generalized)  Recurrent falls  Other abnormalities of gait and mobility     Problem List Patient Active Problem List   Diagnosis Date Noted   Genetic testing 11/25/2020   Family history of breast cancer    Family history of prostate cancer    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

## 2021-06-27 LAB — CANCER ANTIGEN 19-9: CA 19-9: 3 U/mL (ref 0–35)

## 2021-06-28 ENCOUNTER — Ambulatory Visit: Payer: Medicare Other | Admitting: Rehabilitation

## 2021-06-28 ENCOUNTER — Other Ambulatory Visit: Payer: Self-pay

## 2021-06-28 ENCOUNTER — Telehealth: Payer: Self-pay

## 2021-06-28 ENCOUNTER — Encounter: Payer: Self-pay | Admitting: Rehabilitation

## 2021-06-28 DIAGNOSIS — I82542 Chronic embolism and thrombosis of left tibial vein: Secondary | ICD-10-CM | POA: Diagnosis not present

## 2021-06-28 DIAGNOSIS — I82403 Acute embolism and thrombosis of unspecified deep veins of lower extremity, bilateral: Secondary | ICD-10-CM

## 2021-06-28 DIAGNOSIS — R2689 Other abnormalities of gait and mobility: Secondary | ICD-10-CM

## 2021-06-28 DIAGNOSIS — R296 Repeated falls: Secondary | ICD-10-CM

## 2021-06-28 DIAGNOSIS — M6281 Muscle weakness (generalized): Secondary | ICD-10-CM

## 2021-06-28 NOTE — Therapy (Signed)
[place], [place], [address] Phone: [phone]   Fax:  [phone]  Physical Therapy Treatment  Patient Details  Name: Pamela Kennedy MRN: [id number] Date of Birth: 15-Aug-1956 Referring Provider (PT): Dr. Burr Medico   Encounter Date: 06/28/2021   PT End of Session - 06/28/21 1355     Visit Number 13    Number of Visits 21    PT Start Time 1300    PT Stop Time 3007    PT Time Calculation (min) 53 min    Activity Tolerance Patient tolerated treatment well    Behavior During Therapy [facility] for tasks assessed/performed             Past Medical History:  Diagnosis Date   Depression    Diabetes mellitus without complication ([place])    Family history of breast cancer    Family history of prostate cancer    Overactive bladder     Past Surgical History:  Procedure Laterality Date   CHOLECYSTECTOMY      There were no vitals filed for this visit.   Subjective Assessment - 06/28/21 1302     Subjective My left leg has just hurt since this morning.  Not sure why    Pertinent History History of gallbladder surgery for cancer in 2021 with possible stroke occurring.  Rehab after the surgery x 3 weeks then returning to home with sister. Pt had hospitalization in May and is having difficulty recovering her strength. Pt is no longer getting a chemo pill    Currently in Pain? No/denies                               [facility] Adult PT Treatment/Exercise - 06/28/21 0001       Ambulation/Gait   Gait Comments obstacle course of step onto 8" step, cobble board, air ex, 6 in step , cobbleboard. Pt  did well with each repetition of course and was able to repeat 2 times down and back stepping forward, and 1 time down and back stepping laterally without LOB, also did serpentine arond obstacles with no LOB      High Level Balance   High Level Balance Comments side stepping, marching, carioca,  tandem stance down and back short hall. CGA PT      Exercises   Other Exercises  slides  with yellow ball 5" x5 in 60seconds stopping due to knee pain, 3# bicep curls 33mn on/30sec off, standing march with 3# weights held out into flexion, sit to stand, each x 2      Knee/Hip Exercises: Aerobic   Recumbent Bike 8 min lev 1                       PT Short Term Goals - 05/05/21 1410       PT SHORT TERM GOAL #1   Title Pt will be independent in a beginning home exercise program for leg strength    Time 3    Period Weeks    Status New               PT Long Term Goals - 06/28/21 1321       PT LONG TERM GOAL #1   Title Pt will be ind with final HEP for continued strength and mobility    Status Achieved      PT LONG TERM GOAL #2  Title Pt will improve BERG by at least 46 points to demonstrate improved balance    Status Achieved      PT LONG TERM GOAL #3   Title Pt will decrease TUG to < 9.5 seconds demonstrating improved functional moblity    Status Achieved      PT LONG TERM GOAL #4   Title Pt will be educated on fall risk strategies for home    Status Achieved      PT LONG TERM GOAL #5   Status Achieved      PT LONG TERM GOAL #6   Baseline pt reports only able to ambulate about 15 mins before needing seated rest-06/19/21 - 06/28/21    Time 4    Period Weeks    Status New                   Plan - 06/28/21 1355     Clinical Impression Statement Last scheduled PT session today.  Reassessed goals which were all met except for being able to walk 106mn.  Pt was encouraged to continue walking increased time to build endurance and to keep up HEP.  Note was sent to Dr. FBurr Medicoregarding new calf pain.  Pt is okay with DC and knows to return if needed.    PT Frequency 2x / week    PT Duration 4 weeks    PT Treatment/Interventions ADLs/Self Care Home Management;Therapeutic exercise;Gait training;Therapeutic activities;Patient/family education     Consulted and Agree with Plan of Care Patient             Patient will benefit from skilled therapeutic intervention in order to improve the following deficits and impairments:     Visit Diagnosis: Muscle weakness (generalized)  Recurrent falls  Other abnormalities of gait and mobility     Problem List Patient Active Problem List   Diagnosis Date Noted   Genetic testing 11/25/2020   Family history of breast cancer    Family history of prostate cancer    [place] 11/03/2020    TStark Bray PT 06/28/2021, 2:43 PM  [place] [address]Phone: [phone]  Fax:  [phone] Name: Pamela BrackensMRN: [id number] of Birth: 1Aug 22, 1957  PHYSICAL THERAPY DISCHARGE SUMMARY  Visits from Start of Care: 13  Current functional level related to goals / functional outcomes: goals met   Remaining deficits: Low endurance; Lt sided weakness   Education / Equipment: HEP Plan: Patient agrees to discharge.  Patient goals were not met. Patient is being discharged due to meeting the stated rehab goals.

## 2021-06-28 NOTE — Telephone Encounter (Signed)
This nurse spoke with patients daughter and informed that physical therapy reached out to our office due to increased size and complaints of pain to LLE.  MD ordered STAT venous doppler to rule out DVT.  Appointment is scheduled for Friday 06/30/21 at 10 am.  Daughter is in agreement with date and time.  No further questions or concerns at this time.

## 2021-06-30 ENCOUNTER — Telehealth: Payer: Self-pay

## 2021-06-30 ENCOUNTER — Other Ambulatory Visit: Payer: Self-pay

## 2021-06-30 ENCOUNTER — Ambulatory Visit (HOSPITAL_COMMUNITY)
Admission: RE | Admit: 2021-06-30 | Discharge: 2021-06-30 | Disposition: A | Discharge status: Home / Self Care | Payer: Medicare Other | Source: Ambulatory Visit | Attending: Hematology | Admitting: Hematology

## 2021-06-30 ENCOUNTER — Encounter: Payer: Self-pay | Admitting: Hematology

## 2021-06-30 DIAGNOSIS — I82403 Acute embolism and thrombosis of unspecified deep veins of lower extremity, bilateral: Secondary | ICD-10-CM | POA: Diagnosis present

## 2021-06-30 NOTE — Progress Notes (Signed)
Left lower extremity venous duplex has been completed. Preliminary results can be found in CV Proc through chart review.  Results were given to Mitchell at Dr. Ernestina Penna office.  06/30/21 10:22 AM Carlos Levering RVT

## 2021-06-30 NOTE — Telephone Encounter (Signed)
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  Stated that she was positive previously as well and at this point the patient is considered to be Chronic.  This nurse acknowledged.  No further questions or concerns at this time.  MD made aware.

## 2021-06-30 NOTE — Telephone Encounter (Signed)
I called pt, and spoke with her sister and daughter. I reviewed her Doppler result, and she will continue Xarelto at same dose. They are concerned about her discoloration of her b/l feet, it appears purble/blue, no significant pain or coldness. I will schedule her to see me in my office at 1pm on Monday 9/26. They know to bring her to ED if she developed worsening symptoms.   Truitt Merle  06/30/2021

## 2021-07-03 ENCOUNTER — Other Ambulatory Visit: Payer: Self-pay

## 2021-07-03 ENCOUNTER — Encounter: Payer: Self-pay | Admitting: Hematology

## 2021-07-03 ENCOUNTER — Inpatient Hospital Stay (HOSPITAL_BASED_OUTPATIENT_CLINIC_OR_DEPARTMENT_OTHER): Payer: Medicare Other | Admitting: Hematology

## 2021-07-03 VITALS — BP 139/80 | HR 101 | Temp 98.0°F | Resp 19 | Ht 64.0 in | Wt 189.2 lb

## 2021-07-03 DIAGNOSIS — C23 Malignant neoplasm of gallbladder: Secondary | ICD-10-CM

## 2021-07-03 DIAGNOSIS — I82542 Chronic embolism and thrombosis of left tibial vein: Secondary | ICD-10-CM | POA: Diagnosis not present

## 2021-07-03 NOTE — Progress Notes (Signed)
[place]   [phone] [phone]   Clinic Follow up Note   Patient Care Team: System, Provider Not In as PCP - General [name], MD (Psychiatry) Laretta Bolster, NP as Nurse Practitioner (Internal Medicine) Truitt Merle, MD as Consulting Physician (Oncology) Jonnie Finner, RN (Inactive) as Oncology Nurse Navigator  Date of Service:  07/03/2021  CHIEF COMPLAINT: f/l leg edema and discoloration   CURRENT THERAPY:  Cancer surveillance  ASSESSMENT & PLAN:  Pamela Kennedy is a 65 y.o. female with   1. LE Edema and DVT -I previously prescribed xarelto on 02/01/21. She stopped this during her hospitalization due to GI bleeding. She continues to have b/l leg swelling, so she restarted Xarelto at 20 mg daily on 03/27/21 -repeat Doppler on 04/28/21 confirmed left LE acute DVT involving popliteal and peroneal veins. Right LE was negative within limitation of poor ultrasound/tissue interface. -Her daughter contacted [place] on 06/28/21 with concerns for increased swelling and pain of LLE. 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  I reviewed his results with her -We will continue Xarelto 20 mg daily indefinitely -She is wearing compression socks. -She has a mild discoloration on her feet, both feet are warm with good pulses, no concern for ischemia.  2. Gallbladder cancer cancer, pT3N1M0, stage IIIB -She had stage IIIb gallbladder cancer diagnosed in 06/2020, which was removed completely by 2 surgeries, with negative margins. Last surgery in 09/2020. -I started her on adjuvant Xeloda $RemoveBeforeD'1000mg'QFuLYKBZwYGqGr$ /m2 bid day 1-14 every 21 days for 6 months on  11/14/20. Given decreased tolerance, I reduced and postponed her Xeloda to $RemoveB'1500mg'ozbdOdIQ$  BID starting cycle 4 on 01/23/21. -she was hospitalized for mental status change after cycle 4 chemo, and had a prolonged hospital stay. Xeloda held since then. She is recovering well now.  -CT CAP w/o  contrast (due to vein issues) on 05/18/21 showed no evidence of recurrence, adenopathy, or metastasis. Plan for repeat CT with contrast in 6 months, will order at next visit. -I do not plan to restart Xeloda due to her poor tolerance.  -She is now on cancer surveillance given her hospital admission. -I reviewed her recent lab results  -labs and f/u in 2 months   3. Falls, imbalance, Lethargy -She was hospitalized in May for lethargy, frequent fall -She also has slurred speak and lethargy. Family is concerned for stroke or this is due to her Psych meds (she has had issues with keeping medications well managed).  -Most recent brain MRI from 03/02/21 showed: no acute intracranial abnormality; chronic lacunar infarct of right thalamus; stable meningioma overlying left sylvian fissure; unchanged presumptive left pituitary adenoma -She is followed by Dr. Sabra Heck in neurology. -She has completed physical therapy and has had no recent falls. Her mental status is back to her normal baseline now. -She plans to return to her home next week  4. Genetics testing: Her 11/2020 results were negative for pathogenetic mutations.    5. 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   6. DM, Hyperglycemia, Depression -She is on metformin for her DM. She is currently trying to find a new PCP. -For her depression, she will f/u with her psychiatrist.    7. Tobacco Use -She previously used snuff/dip. She and her daughter reports she is no longer using. I encouraged  her to continue cessation, as the nicotine can cause blood clots.     PLAN:  -continue Xarelto $RemoveBeforeDEI'20mg'nrLJRTJVWYezYVpX$  indefinitely -labs and f/u as scheduled on 08/21/21   No problem-specific Assessment & Plan notes found for this encounter.   SUMMARY OF  ONCOLOGIC HISTORY: Oncology History Overview Note  Cancer Staging Primary gall bladder adenocarcinoma ([place]) Staging form: Gallbladder, AJCC 8th Edition - Pathologic stage from 09/20/2020: Stage IIIB (pT3, pN1, cM0) - Signed by Truitt Merle, MD on 11/03/2020 Histologic grade (G): G3 Histologic grading system: 3 grade system Residual tumor (R): R0 - None Histologic sub-type: Adenocarcinoma    Primary gall bladder adenocarcinoma ([place])  07/05/2020 Initial Biopsy   Cholecystectomy by Dr Moreen Fowler   Final Diagnosis A. Gallbladder, Cholecystectomy:  -Invasive adenocarcinoma, moderately to poorly-differentiated, infiltrating into the perimuscular fibroconnective tissue.  -Multifocal perineural invasion identified.  -The cystic duct margin is negative for carcinoma.  -Gallbladder with acute necrotizing cholecystitis and cholelithiasis.     08/17/2020 Imaging   MRI Abdomen at [facility]  IMPRESSION -Limited evaluation secondary to motion and use of motion insensitive sequences with suboptimal arterial phase timing.   -Multiloculated collection within the gallbladder fossa with subtle peripheral enhancement may represent a postoperative seroma versus biloma. An abscess is thought to be unlikely.   -Prominent portacaval nodes, indeterminate and possibly reactive.   -Asymmetric left breast tissue likely fibroglandular tissue. Recommend  correlation with mammogram     08/25/2020 Imaging   CT Chest 08/25/20 at [facility] IMPRESSION:   No evidence of intrathoracic metastatic disease.   Focal nodular asymmetries in the left breast requiring follow-up to exclude neoplastic etiology.   Recent cholecystectomy with slight inflammatory stranding at the gallbladder fossa.   Probable thyroid goiter with mild enlargement and hypodense lesions.   09/20/2020 Cancer Staging   Staging form: Gallbladder, AJCC 8th Edition - Pathologic stage from 09/20/2020: Stage IIIB (pT3, pN1, cM0) - Signed by Truitt Merle, MD on  11/03/2020   09/20/2020 Surgery   Surgery of liver and abdomen by Dr Olen Pel at [facility]    09/20/2020 Pathology Results    Final Diagnosis   A: Lymph nodes, common and proper hepatic, lymphadenectomy: -One of seven lymph nodes, positive for adenocarcinoma. (1/7) -The lymph nodes show nonnecrotizing granulomatous reaction.   B: Lymph nodes, hepatic duodenal, lymphadenectomy: -One of two lymph nodes, positive for adenocarcinoma. (1/2)   C: Cystic duct, biopsy: -Bile duct, negative for carcinoma.   D: Liver, partial hepatectomy (gallbladder fossa): -Adenocarcinoma, poorly-differentiated, 1.0 cm in size,  infiltrating liver. -The cauterized liver parenchymal resection margin is negative for carcinoma.  (See comment)   11/03/2020 Initial Diagnosis   Primary gall bladder adenocarcinoma ([place])   11/08/2020 Tumor Marker   CA 19-9 - less than 2   11/14/2020 -  Chemotherapy   Xeloda $Remov'2000mg'rQObaf$  Bid for day 1-14 every 21 days starting on 11/14/20    11/25/2020 Genetic Testing   Negative genetic testing:  No pathogenic variants detected on the [facility] Multi-Cancer + RNA panel. The report date is 11/25/2020.   The Multi-Cancer + RNA Panel offered by [facility] includes sequencing and/or deletion/duplication analysis of the following 84 genes:  AIP*, ALK, APC*, ATM*, AXIN2*, BAP1*, BARD1*, BLM*, BMPR1A*, BRCA1*, BRCA2*, BRIP1*, CASR, CDC73*, CDH1*, CDK4, CDKN1B*, CDKN1C*, CDKN2A, CEBPA, CHEK2*, CTNNA1*, DICER1*, DIS3L2*, EGFR, EPCAM, FH*, FLCN*, GATA2*, GPC3, GREM1, HOXB13, HRAS, KIT, MAX*, MEN1*, MET, MITF, MLH1*, MSH2*, MSH3*, MSH6*, MUTYH*, NBN*, NF1*, NF2*, NTHL1*, PALB2*, PDGFRA, PHOX2B, PMS2*, POLD1*, POLE*, POT1*, PRKAR1A*, PTCH1*, PTEN*, RAD50*, RAD51C*, RAD51D*, RB1*, RECQL4, RET,  RUNX1*, SDHA*, SDHAF2*, SDHB*, SDHC*, SDHD*, SMAD4*, SMARCA4*, SMARCB1*, SMARCE1*, STK11*, SUFU*, TERC, TERT, TMEM127*, Tp53*, TSC1*, TSC2*, VHL*, WRN*, and WT1.  RNA analysis is performed for * genes.    02/06/2021 Imaging   CT AP  with [facility]  Fluid-filled colon. Diarrhea?  No pneumatosis or vein gas identified. No abscess. Patent mesenteric arteries and veins.   Mild bilateral hydronephroureter without obstructing calculus. Urinary bladder distention   02/06/2021 Imaging   US Liver at [facility]  Mildly heterogeneous and mild-to-moderately increased hepatic echotexture suggestive of fatty infiltration of the liver. No evidence of hepatic mass or intrahepatic or extrahepatic biliary ductal dilatation. Nonvisualized gallbladder consistent with prior cholecystectomy. Otherwise unremarkable exam.    02/06/2021 Imaging   MRI Brain at [facility]  CONCLUSION:  1. Mild cortical neuronal volume loss with mild white matter small vessel angiopathy without acute intracranial abnormality.    Addendum:  Stable left posterior lateral parietal lobe 2.1 x 1.2 cm extra-axial  meningioma series 6:16.  The mass is not well demarcated on the FLAIR and T1 weighted series  secondary to patient motion.    02/07/2021 Procedure   Upper Endoscopy at [facility]  IMPRESSION - Normal esophagus.                         - Gastric mucosal atrophy.                         - Normal examined duodenum.                         - No specimens collected   05/18/2021 Imaging   CT CAP w/o contrast (d/t vein issues)  IMPRESSION: Chest Impression: 1. No evidence of thoracic metastasis. 2. Coronary artery calcification and Aortic Atherosclerosis (ICD10-I70.0).   Abdomen / Pelvis Impression: 1. No evidence of local recurrence of gallbladder carcinoma. No IV contrast was administered as described in the technique section. 2. No adenopathy in the abdomen pelvis.  No peritoneal metastasis. 3. No skeletal metastasis identified.      INTERVAL HISTORY:  Pamela Kennedy is here for a follow up of gallbladder cancer. She was last seen by me on 05/22/21. She presents to the clinic accompanied by her sister(?). She reports pain to her left foot and continued  weakness to her right leg.   All other systems were reviewed with the patient and are negative.  MEDICAL HISTORY:  Past Medical History:  Diagnosis Date   Depression    Diabetes mellitus without complication ([place])    Family history of breast cancer    Family history of prostate cancer    Overactive bladder     SURGICAL HISTORY: Past Surgical History:  Procedure Laterality Date   CHOLECYSTECTOMY      I have reviewed the social history and family history with the patient and they are unchanged from previous note.  ALLERGIES:  has No Known Allergies.  MEDICATIONS:  Current Outpatient Medications  Medication Sig Dispense Refill   benztropine (COGENTIN) 0.5 MG tablet Take 0.5 mg by mouth 2 (two) times daily.     fesoterodine (TOVIAZ) 8 MG TB24 tablet Take 8 mg by mouth daily.     lovastatin (ALTOPREV) 20 MG 24 hr tablet Take 20 mg by mouth at bedtime.     metFORMIN (GLUCOPHAGE) 500 MG tablet Take by mouth 2 (two) times daily with a meal.     raloxifene (EVISTA) 60  MG tablet Take 60 mg by mouth daily.     risperidone (RISPERDAL) 4 MG tablet Take 4 mg by mouth daily. At bedtime     rivaroxaban (XARELTO) 20 MG TABS tablet Take 1 tablet (20 mg total) by mouth daily with supper. 30 tablet 3   venlafaxine (EFFEXOR) 75 MG tablet Take 75 mg by mouth daily.     venlafaxine XR (EFFEXOR-XR) 150 MG 24 hr capsule Take 150 mg by mouth daily with breakfast.     No current facility-administered medications for this visit.    PHYSICAL EXAMINATION: ECOG PERFORMANCE STATUS: 2 - Symptomatic, <50% confined to bed  Vitals:   07/03/21 1257  BP: 139/80  Pulse: (!) 101  Resp: 19  Temp: 98 F (36.7 C)  SpO2: 100%   Wt Readings from Last 3 Encounters:  07/03/21 189 lb 3.2 oz (85.8 kg)  05/22/21 181 lb 14.4 oz (82.5 kg)  04/27/21 178 lb 12.8 oz (81.1 kg)     GENERAL:alert, no distress and comfortable SKIN: skin color normal, no rashes or significant lesions EYES: normal, Conjunctiva are  pink and non-injected, sclera clear HEART: (+) b/l lower extremity edema  NEURO: alert & oriented x 3 with fluent speech  LABORATORY DATA:  I have reviewed the data as listed CBC Latest Ref Rng & Units 06/26/2021 05/18/2021 04/27/2021  WBC 4.0 - 10.5 K/uL 8.1 7.3 9.6  Hemoglobin 12.0 - 15.0 g/dL 11.5(L) 10.2(L) 9.8(L)  Hematocrit 36.0 - 46.0 % 39.7 35.4(L) 33.6(L)  Platelets 150 - 400 K/uL 293 334 409(H)     CMP Latest Ref Rng & Units 06/26/2021 05/18/2021 04/27/2021  Glucose 70 - 99 mg/dL 94 117(H) 171(H)  BUN 8 - 23 mg/dL _0 Creatinine 0.44 - 1.00 mg/dL 0.77 0.77 0.72  Sodium 135 - 145 mmol/L 143 142 140  Potassium 3.5 - 5.1 mmol/L 4.3 4.1 4.0  Chloride 98 - 111 mmol/L 107 107 108  CO2 22 - 32 mmol/L _1 Calcium 8.9 - 10.3 mg/dL 9.9 9.6 9.3  Total Protein 6.5 - 8.1 g/dL 7.5 7.0 6.6  Total Bilirubin 0.3 - 1.2 mg/dL 0.2(L) 0.2(L) 0.4  Alkaline Phos 38 - 126 U/L 136(H) 133(H) 102  AST 15 - 41 U/L 13(L) 17 19  ALT 0 - 44 U/L _2 RADIOGRAPHIC STUDIES: I have personally reviewed the radiological images as listed and agreed with the findings in the report. No results found.    No orders of the defined types were placed in this encounter.  All questions were answered. The patient knows to call the clinic with any problems, questions or concerns. No barriers to learning was detected. The total time spent in the appointment was 20 minutes.     Truitt Merle, MD 07/03/2021   I, Wilburn Mylar, am acting as scribe for Truitt Merle, MD.   I have reviewed the above documentation for accuracy and completeness, and I agree with the above.

## 2021-07-25 ENCOUNTER — Encounter: Payer: Self-pay | Admitting: Hematology

## 2021-07-25 ENCOUNTER — Encounter: Payer: Self-pay | Admitting: Neurology

## 2021-07-26 ENCOUNTER — Other Ambulatory Visit: Payer: Self-pay

## 2021-07-26 NOTE — Progress Notes (Signed)
Chart review no orders entered on this encoutner.

## 2021-08-14 ENCOUNTER — Other Ambulatory Visit (HOSPITAL_COMMUNITY): Payer: Self-pay

## 2021-08-20 NOTE — Progress Notes (Signed)
NEUROLOGY CONSULTATION NOTE  Pamela Kennedy MRN: [id number] DOB: 1955-11-18  Referring provider: Laurena Slimmer, MD  Reason for consult:  thalamic infarct, memory deficits  Assessment/Plan:   Encephalopathy, improved - unclear if metabolic (UTI) vs autoimmune encephalopathy vs pharmacologic (not taking medications correctly) vs secondary to chemotherapy; status epilepticus seems unlikely Major neurocognitive disorder - unclear if cerebrovascular vs neurodegenerative Right thalamic infarct, cryptic - small vessel disease vs embolic DVTs on Xarelto  1  Neuropsychological evaluation to sort out possibility of underlying neurodegenerative disease 2  Secondary stroke prevention as managed by PCP:  - Xarelto for DVT.  Due to recurrent DVTs, may be on [facility] indefinitely.  If not, then she would need to at least start ASA 81mg  daily once off of Xarelto  -  Statin:  LDL goal less than 70  - Hgb A1c goal less than 7  -  Normotensive blood pressure 3  Home health assessment 4  Follow up 6 months.   Subjective:  Pamela Kennedy is a 65 year old right-handed female with gallbladder adenocarcinoma s/p resection, DVTs on Xarelto, DM II and depression who presents for right thalamic stroke, memory deficits.  History supplemented by prior neurologist's notes.  She was hospitalized at [facility]  in December 2021 for surgery for gallbladder adenocarcinoma.  Post-anesthesia, she was unresponsive to painful stimuli and was noted to have a gaze deviation.  Seen by neurology who considered post-anesthesia encephalopathy vs seizure vs hepatic encephalopathy vs less likely neuroleptic malignant syndrome (due to being on psychotropic medications for her depression).  She was loaded with Keppra.  MRI of brain showed acute/subacute right thalamic infarct as well as 2.1 x 1.2 cm left parieto-occipital meningioma and left pituitary adenoma.  CTA head and neck showed no LVO or high-grade stenosis.   Echocardiogram showed normal EF with no cardiac source of emboli.  MRI cervical/thoracic/lumbar spine unremarkable.  Continuous EEG showed diffuse background slowing but no epileptiform discharges or electrographic seizures.  Mentation started to improve. Started on ASA 81mg  and atorvastatin 80mg  daily.   Following hospitalization, she was started on chemotherapy.  She has had worsening cognition.  She had been noncompliant or mixing up taking her psychiatric medications.  She began to have reduced food intake.  She started having increased falls.  She required help with dressing, taking medications, personal hygiene and transportation.  Due to lethargy and falls, chemotherapy was discontinued.  In May 2022, she had worsening mental status with melena for which she was admitted to [facility].  EGD negative and subsequently resolved.  MRI of brain with and without contrast showed stable 2.1 x s1.2 m left posterior parietal meningioma and left pituitary adenoma but no new or acute abnormalities.  EEG showed diffuse background slowing with occasional generalized periodic discharges.  She did not tolerate Keppra and was switched to Vimpat.  LP for CNS analysis showed 10 nucleated cells (repeat 8), protein 120 (repeat 91), glucose 104 (repeat 142), autoimmune/paraneoplastic antibody panel negative, negative OCBs, IgG index 0.6, ACE 1.5, HSV 1&2 negative, VDRL negative, Cryptococcal antigen negative, cytology negative, , thought to be nonspecific.  HIV was non-reactive.  LFTs were elevated - acute hepatitis panel was negative (unclear if related to statin).  She was found to have E coli UTI which was treated with cephalosporins.  Cortisol level 25.7, normal thyroglobulin antibody and thyroid peroxidase antibody.  Serum dementia/autoimmune/paraneoplastic antibody panel showed GAD65 antibody 0.48, thought to be nonspecific and nondiagnostic.  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     She is doing much better.  Cognition improved.  Still repeats questions but not as much.  Lives alone but her sisters live nearby.  Sister manages her finances and sets up her medications for her.  Patient able to perform all ADLs.  Uses microwave.  Drives locally and does her own shopping.  Mother had memory problems.     PAST MEDICAL HISTORY: Past Medical History:  Diagnosis Date   Depression    Diabetes mellitus without complication ([place])    Family history of breast cancer    Family history of prostate cancer    Overactive bladder     PAST SURGICAL HISTORY: Past Surgical History:  Procedure Laterality Date   CHOLECYSTECTOMY      MEDICATIONS: Current Outpatient Medications on File Prior to Visit  Medication Sig Dispense Refill   benztropine (COGENTIN) 0.5 MG tablet Take 0.5 mg by mouth 2 (two) times daily.     fesoterodine (TOVIAZ) 8 MG TB24 tablet Take 8 mg by mouth daily.     lovastatin (ALTOPREV) 20 MG 24 hr tablet Take 20 mg by mouth at bedtime.     metFORMIN (GLUCOPHAGE) 500 MG tablet Take by mouth 2 (two) times daily with a meal.     raloxifene (EVISTA) 60 MG tablet Take 60 mg by mouth daily.     risperidone (RISPERDAL) 4 MG tablet Take 4 mg by mouth daily. At bedtime     rivaroxaban (XARELTO) 20 MG TABS tablet Take 1 tablet (20 mg total) by mouth daily with supper. 30 tablet 3   venlafaxine (EFFEXOR) 75 MG tablet Take 75 mg by mouth daily.     venlafaxine XR (EFFEXOR-XR) 150 MG 24 hr capsule Take 150 mg by mouth daily with breakfast.     No current facility-administered medications on file prior to visit.    ALLERGIES: No Known Allergies  FAMILY HISTORY: Family History  Problem Relation Age of Onset   Prostate cancer Father 6       prostate cancer, metastatic   Breast cancer Sister        breast cancer, dx unknown age   Prostate cancer Brother 58       prostate cancer, dx 50s/60s   Breast cancer  Sister 4       breast cancer, dx unknown age   Stroke Paternal Aunt     Objective:  Blood pressure (!) 147/83, pulse (!) 105, height 5\' 7"  (1.702 m), weight 203 lb (92.1 kg), SpO2 100 %. General: No acute distress.  Patient appears well-groomed.   Head:  Normocephalic/atraumatic Eyes:  fundi examined but not visualized Neck: supple, no paraspinal tenderness, full range of motion Back: No paraspinal tenderness Heart: regular rate and rhythm Lungs: Clear to auscultation bilaterally. Vascular: No carotid bruits. Neurological Exam: Mental status: alert and oriented to person, place, and time, speech fluent and not dysarthric, language intact. [employer] Mental Exam 08/21/2021  Weekday Correct 1  Current year 1  What state are we in? 1  Amount spent 0  Amount left 0  # of Animals 2  5 objects recall 3  Number series 0  Hour markers 0  Time correct 2  Placed X in triangle correctly 0  Largest Figure 1  Name of female 2  Date back to work 2  Type of work 2  State she lived in 2  Total score 19   Cranial  nerves: CN I: not tested CN II: pupils equal, round and reactive to light, visual fields intact CN III, IV, VI:  full range of motion, no nystagmus, no ptosis CN V: facial sensation intact. CN VII: upper and lower face symmetric CN VIII: hearing intact CN IX, X: gag intact, uvula midline CN XI: sternocleidomastoid and trapezius muscles intact CN XII: tongue midline Bulk & Tone: normal, no fasciculations. Motor:  muscle strength 5/5 throughout Sensation:  Pinprick, temperature and vibratory sensation intact. Deep Tendon Reflexes:  2+ throughout,  toes downgoing.   Finger to nose testing:  Without dysmetria.   Heel to shin:  Without dysmetria.   Gait:  Normal station and stride.  Romberg negative.    Thank you for allowing me to take part in the care of this patient.  Metta Clines, DO

## 2021-08-21 ENCOUNTER — Inpatient Hospital Stay: Payer: Medicare Other

## 2021-08-21 ENCOUNTER — Encounter: Payer: Self-pay | Admitting: Hematology

## 2021-08-21 ENCOUNTER — Inpatient Hospital Stay: Payer: Medicare Other | Attending: Hematology | Admitting: Hematology

## 2021-08-21 ENCOUNTER — Other Ambulatory Visit: Payer: Self-pay

## 2021-08-21 ENCOUNTER — Ambulatory Visit (INDEPENDENT_AMBULATORY_CARE_PROVIDER_SITE_OTHER): Payer: Medicare Other | Admitting: Neurology

## 2021-08-21 VITALS — BP 147/87 | HR 106 | Temp 98.0°F | Resp 18 | Ht 69.0 in | Wt 202.6 lb

## 2021-08-21 VITALS — BP 147/83 | HR 105 | Ht 67.0 in | Wt 203.0 lb

## 2021-08-21 DIAGNOSIS — E1165 Type 2 diabetes mellitus with hyperglycemia: Secondary | ICD-10-CM | POA: Diagnosis not present

## 2021-08-21 DIAGNOSIS — D649 Anemia, unspecified: Secondary | ICD-10-CM

## 2021-08-21 DIAGNOSIS — R413 Other amnesia: Secondary | ICD-10-CM

## 2021-08-21 DIAGNOSIS — Z7984 Long term (current) use of oral hypoglycemic drugs: Secondary | ICD-10-CM | POA: Insufficient documentation

## 2021-08-21 DIAGNOSIS — I6381 Other cerebral infarction due to occlusion or stenosis of small artery: Secondary | ICD-10-CM

## 2021-08-21 DIAGNOSIS — D509 Iron deficiency anemia, unspecified: Secondary | ICD-10-CM | POA: Insufficient documentation

## 2021-08-21 DIAGNOSIS — C23 Malignant neoplasm of gallbladder: Secondary | ICD-10-CM

## 2021-08-21 DIAGNOSIS — G9340 Encephalopathy, unspecified: Secondary | ICD-10-CM

## 2021-08-21 DIAGNOSIS — Z86711 Personal history of pulmonary embolism: Secondary | ICD-10-CM | POA: Insufficient documentation

## 2021-08-21 DIAGNOSIS — Z7901 Long term (current) use of anticoagulants: Secondary | ICD-10-CM | POA: Diagnosis not present

## 2021-08-21 DIAGNOSIS — Z79899 Other long term (current) drug therapy: Secondary | ICD-10-CM | POA: Insufficient documentation

## 2021-08-21 DIAGNOSIS — F32A Depression, unspecified: Secondary | ICD-10-CM | POA: Insufficient documentation

## 2021-08-21 DIAGNOSIS — D563 Thalassemia minor: Secondary | ICD-10-CM | POA: Insufficient documentation

## 2021-08-21 LAB — CBC WITH DIFFERENTIAL (CANCER CENTER ONLY)
Abs Immature Granulocytes: 0.01 10*3/uL (ref 0.00–0.07)
Basophils Absolute: 0.1 10*3/uL (ref 0.0–0.1)
Basophils Relative: 1 %
Eosinophils Absolute: 0.1 10*3/uL (ref 0.0–0.5)
Eosinophils Relative: 1 %
HCT: 41.1 % (ref 36.0–46.0)
Hemoglobin: 12.1 g/dL (ref 12.0–15.0)
Immature Granulocytes: 0 %
Lymphocytes Relative: 21 %
Lymphs Abs: 1.8 10*3/uL (ref 0.7–4.0)
MCH: 20.6 pg — ABNORMAL LOW (ref 26.0–34.0)
MCHC: 29.4 g/dL — ABNORMAL LOW (ref 30.0–36.0)
MCV: 69.9 fL — ABNORMAL LOW (ref 80.0–100.0)
Monocytes Absolute: 0.4 10*3/uL (ref 0.1–1.0)
Monocytes Relative: 5 %
Neutro Abs: 6.2 10*3/uL (ref 1.7–7.7)
Neutrophils Relative %: 72 %
Platelet Count: 308 10*3/uL (ref 150–400)
RBC: 5.88 MIL/uL — ABNORMAL HIGH (ref 3.87–5.11)
RDW: 18.6 % — ABNORMAL HIGH (ref 11.5–15.5)
WBC Count: 8.5 10*3/uL (ref 4.0–10.5)
nRBC: 0 % (ref 0.0–0.2)

## 2021-08-21 LAB — CMP (CANCER CENTER ONLY)
ALT: 11 U/L (ref 0–44)
AST: 15 U/L (ref 15–41)
Albumin: 3.8 g/dL (ref 3.5–5.0)
Alkaline Phosphatase: 144 U/L — ABNORMAL HIGH (ref 38–126)
Anion gap: 10 (ref 5–15)
BUN: 11 mg/dL (ref 8–23)
CO2: 23 mmol/L (ref 22–32)
Calcium: 9 mg/dL (ref 8.9–10.3)
Chloride: 109 mmol/L (ref 98–111)
Creatinine: 0.75 mg/dL (ref 0.44–1.00)
GFR, Estimated: >60 mL/min (ref >60)
Glucose, Bld: 85 mg/dL (ref 70–99)
Potassium: 4 mmol/L (ref 3.5–5.1)
Sodium: 142 mmol/L (ref 135–145)
Total Bilirubin: 0.4 mg/dL (ref 0.3–1.2)
Total Protein: 7.3 g/dL (ref 6.5–8.1)

## 2021-08-21 LAB — FERRITIN: Ferritin: 19 ng/mL (ref 11–307)

## 2021-08-21 NOTE — Progress Notes (Signed)
[facility]   [phone] [phone]   Clinic Follow up Note   Patient Care Team: System, Provider Not In as PCP - General [name], MD (Psychiatry) Laretta Bolster, NP as Nurse Practitioner (Internal Medicine) Truitt Merle, MD as Consulting Physician (Oncology) Jonnie Finner, RN (Inactive) as Oncology Nurse Navigator  Date of Service:  08/21/2021  CHIEF COMPLAINT: f/u of gallbladder cancer  CURRENT THERAPY:  Surveillance  ASSESSMENT & PLAN:  Pamela Kennedy is a 65 y.o. female with   1. Gallbladder cancer cancer, pT3N1M0, stage IIIB -She had stage IIIb gallbladder cancer diagnosed in 06/2020, which was removed completely by 2 surgeries, with negative margins. Last surgery in 09/2020. -I started her on adjuvant Xeloda 1095m/m2 bid day 1-14 every 21 days for 6 months on 11/14/20. Given decreased tolerance, I reduced and postponed her Xeloda to 1505mBID starting cycle 4 on 01/23/21. -she was hospitalized for mental status change after cycle 4 chemo, and had a prolonged hospital stay. Xeloda held since then. She is recovering well now. I do not plan to restart Xeloda due to her poor tolerance.  -CT CAP w/o contrast (due to vein issues) on 05/18/21 showed no evidence of recurrence, adenopathy, or metastasis. Plan for repeat CT with contrast in 11/2021 -She is now on cancer surveillance.  -she is clinically improving since discharge. Labs reviewed, overall stable. Physical exam unremarkable. There is no clinical concern for recurrence.  2. LE Edema and DVT -doppler 02/01/21 showed b/l DVT, she was started on Xarelto at that time. Held 5/1-6/19/22 due to GI bleed. -doppler 04/28/21 showed resolution of RLE DVT, persistent LLE DVT -most recent doppler 06/30/21 showed continued LLE DVT -We will continue Xarelto 20 mg daily indefinitely -mostly resolved   3. Falls, imbalance, Lethargy -She was hospitalized in May for lethargy, frequent fall, and  slurred speech. She also had issues managing her psychiatric medications -Most recent brain MRI from 03/02/21 showed: no acute intracranial abnormality; chronic lacunar infarct of right thalamus; stable meningioma overlying left sylvian fissure -overall much improved.  -she met with Dr. JaTomi Likensn neurology earlier today (08/21/21) -we discussed fall precaution    4. Genetics testing: Her 11/2020 results were negative for pathogenetic mutations.    5. Alpha-thalassemia minor, iron deficiency anemia -low blood counts noted in 11/2020 -positive for Alpha-thalassemia minor (carrier) in 01/2021 -hgb now WNL today (08/21/21)   6. DM, Hyperglycemia, Depression -She is on metformin for her DM. She is currently trying to find a new PCP. -For her depression, she will f/u with her psychiatrist.    7. Tobacco Use -She previously used snuff/dip. She and her daughter reports she is no longer using. I encouraged her to continue cessation, as the nicotine can cause blood clots.     PLAN:  -continue Xarelto 2061mndefinitely,unless she has frequent fall  -labs and CT CAP in 4 months, with f/u the day after.    No problem-specific Assessment & Plan notes found for this encounter.   SUMMARY OF ONCOLOGIC HISTORY: Oncology History Overview Note  Cancer Staging Primary gall bladder adenocarcinoma (HCCBlacktaging form: Gallbladder, AJCC 8th Edition - Pathologic stage from 09/20/2020: Stage IIIB (pT3, pN1, cM0) - Signed by FenTruitt MerleD on 11/03/2020 Histologic grade (G): G3 Histologic grading system: 3 grade system Residual tumor (R): R0 - None Histologic sub-type: Adenocarcinoma    Primary gall bladder adenocarcinoma ([place]CCGoodland9/28/2021 Initial Biopsy   Cholecystectomy by Dr MegMoreen FowlerFinal Diagnosis A. Gallbladder, Cholecystectomy:  -  Invasive adenocarcinoma, moderately to poorly-differentiated, infiltrating into the perimuscular fibroconnective tissue.  -Multifocal perineural invasion identified.   -The cystic duct margin is negative for carcinoma.  -Gallbladder with acute necrotizing cholecystitis and cholelithiasis.     08/17/2020 Imaging   MRI Abdomen at [facility]  IMPRESSION -Limited evaluation secondary to motion and use of motion insensitive sequences with suboptimal arterial phase timing.   -Multiloculated collection within the gallbladder fossa with subtle peripheral enhancement may represent a postoperative seroma versus biloma. An abscess is thought to be unlikely.   -Prominent portacaval nodes, indeterminate and possibly reactive.   -Asymmetric left breast tissue likely fibroglandular tissue. Recommend  correlation with mammogram     08/25/2020 Imaging   CT Chest 08/25/20 at [facility] IMPRESSION:   No evidence of intrathoracic metastatic disease.   Focal nodular asymmetries in the left breast requiring follow-up to exclude neoplastic etiology.   Recent cholecystectomy with slight inflammatory stranding at the gallbladder fossa.   Probable thyroid goiter with mild enlargement and hypodense lesions.   09/20/2020 Cancer Staging   Staging form: Gallbladder, AJCC 8th Edition - Pathologic stage from 09/20/2020: Stage IIIB (pT3, pN1, cM0) - Signed by Truitt Merle, MD on 11/03/2020    09/20/2020 Surgery   Surgery of liver and abdomen by Dr Olen Pel at [facility]    09/20/2020 Pathology Results    Final Diagnosis   A: Lymph nodes, common and proper hepatic, lymphadenectomy: -One of seven lymph nodes, positive for adenocarcinoma. (1/7) -The lymph nodes show nonnecrotizing granulomatous reaction.   B: Lymph nodes, hepatic duodenal, lymphadenectomy: -One of two lymph nodes, positive for adenocarcinoma. (1/2)   C: Cystic duct, biopsy: -Bile duct, negative for carcinoma.   D: Liver, partial hepatectomy (gallbladder fossa): -Adenocarcinoma, poorly-differentiated, 1.0 cm in size,  infiltrating liver. -The cauterized liver parenchymal resection margin is negative for carcinoma.  (See  comment)   11/03/2020 Initial Diagnosis   Primary gall bladder adenocarcinoma ([place])   11/08/2020 Tumor Marker   CA 19-9 - less than 2   11/14/2020 -  Chemotherapy   Xeloda 2064m Bid for day 1-14 every 21 days starting on 11/14/20    11/25/2020 Genetic Testing   Negative genetic testing:  No pathogenic variants detected on the [facility] Multi-Cancer + RNA panel. The report date is 11/25/2020.   The Multi-Cancer + RNA Panel offered by [facility] includes sequencing and/or deletion/duplication analysis of the following 84 genes:  AIP*, ALK, APC*, ATM*, AXIN2*, BAP1*, BARD1*, BLM*, BMPR1A*, BRCA1*, BRCA2*, BRIP1*, CASR, CDC73*, CDH1*, CDK4, CDKN1B*, CDKN1C*, CDKN2A, CEBPA, CHEK2*, CTNNA1*, DICER1*, DIS3L2*, EGFR, EPCAM, FH*, FLCN*, GATA2*, GPC3, GREM1, HOXB13, HRAS, KIT, MAX*, MEN1*, MET, MITF, MLH1*, MSH2*, MSH3*, MSH6*, MUTYH*, NBN*, NF1*, NF2*, NTHL1*, PALB2*, PDGFRA, PHOX2B, PMS2*, POLD1*, POLE*, POT1*, PRKAR1A*, PTCH1*, PTEN*, RAD50*, RAD51C*, RAD51D*, RB1*, RECQL4, RET, RUNX1*, SDHA*, SDHAF2*, SDHB*, SDHC*, SDHD*, SMAD4*, SMARCA4*, SMARCB1*, SMARCE1*, STK11*, SUFU*, TERC, TERT, TMEM127*, Tp53*, TSC1*, TSC2*, VHL*, WRN*, and WT1.  RNA analysis is performed for * genes.    02/06/2021 Imaging   CT AP with [facility]  Fluid-filled colon. Diarrhea?  No pneumatosis or vein gas identified. No abscess. Patent mesenteric arteries and veins.   Mild bilateral hydronephroureter without obstructing calculus. Urinary bladder distention   02/06/2021 Imaging   [place] [facility]  Mildly heterogeneous and mild-to-moderately increased hepatic echotexture suggestive of fatty infiltration of the liver. No evidence of hepatic mass or intrahepatic or extrahepatic biliary ductal dilatation. Nonvisualized gallbladder consistent with prior cholecystectomy. Otherwise unremarkable exam.    02/06/2021 Imaging   MRI Brain at  [facility]  CONCLUSION:  1. Mild cortical neuronal volume loss with mild white matter small vessel angiopathy  without acute intracranial abnormality.    Addendum:  Stable left posterior lateral parietal lobe 2.1 x 1.2 cm extra-axial  meningioma series 6:16.  The mass is not well demarcated on the FLAIR and T1 weighted series  secondary to patient motion.    02/07/2021 Procedure   Upper Endoscopy at [facility]  IMPRESSION - Normal esophagus.                         - Gastric mucosal atrophy.                         - Normal examined duodenum.                         - No specimens collected   05/18/2021 Imaging   CT CAP w/o contrast (d/t vein issues)  IMPRESSION: Chest Impression: 1. No evidence of thoracic metastasis. 2. Coronary artery calcification and Aortic Atherosclerosis (ICD10-I70.0).   Abdomen / Pelvis Impression: 1. No evidence of local recurrence of gallbladder carcinoma. No IV contrast was administered as described in the technique section. 2. No adenopathy in the abdomen pelvis.  No peritoneal metastasis. 3. No skeletal metastasis identified.      INTERVAL HISTORY:  Pamela Kennedy is here for a follow up of gallbladder cancer. She was last seen by me on 07/03/21. She presents to the clinic accompanied by her daughter. She reports she is finally eating well. She denies bladder or bowel concerns, pain, or any other issues.   All other systems were reviewed with the patient and are negative.  MEDICAL HISTORY:  Past Medical History:  Diagnosis Date   Depression    Diabetes mellitus without complication ([place])    Family history of breast cancer    Family history of prostate cancer    Overactive bladder     SURGICAL HISTORY: Past Surgical History:  Procedure Laterality Date   CHOLECYSTECTOMY      I have reviewed the social history and family history with the patient and they are unchanged from previous note.  ALLERGIES:  has No Known Allergies.  MEDICATIONS:  Current Outpatient Medications  Medication Sig Dispense Refill   benztropine (COGENTIN) 0.5 MG tablet  Take 0.5 mg by mouth 2 (two) times daily.     fesoterodine (TOVIAZ) 8 MG TB24 tablet Take 8 mg by mouth daily. (Patient not taking: Reported on 08/21/2021)     lovastatin (ALTOPREV) 20 MG 24 hr tablet Take 20 mg by mouth at bedtime.     metFORMIN (GLUCOPHAGE) 500 MG tablet Take by mouth 2 (two) times daily with a meal.     raloxifene (EVISTA) 60 MG tablet Take 60 mg by mouth daily.     risperidone (RISPERDAL) 4 MG tablet Take 4 mg by mouth daily. At bedtime     rivaroxaban (XARELTO) 20 MG TABS tablet Take 1 tablet (20 mg total) by mouth daily with supper. 30 tablet 3   venlafaxine (EFFEXOR) 75 MG tablet Take 75 mg by mouth daily.     venlafaxine XR (EFFEXOR-XR) 150 MG 24 hr capsule Take 150 mg by mouth daily with breakfast.     No current facility-administered medications for this visit.    PHYSICAL EXAMINATION: ECOG PERFORMANCE STATUS: 2 - Symptomatic, <50% confined to bed  Vitals:   08/21/21 1112  BP: Marland Kitchen)  147/87  Pulse: (!) 106  Resp: 18  Temp: 98 F (36.7 C)  SpO2: 98%   Wt Readings from Last 3 Encounters:  08/21/21 202 lb 9.6 oz (91.9 kg)  08/21/21 203 lb (92.1 kg)  07/03/21 189 lb 3.2 oz (85.8 kg)     GENERAL:alert, no distress and comfortable SKIN: skin color, texture, turgor are normal, no rashes or significant lesions EYES: normal, Conjunctiva are pink and non-injected, sclera clear  NECK: supple, thyroid normal size, non-tender, without nodularity LYMPH:  no palpable lymphadenopathy in the cervical, axillary  LUNGS: clear to auscultation and percussion with normal breathing effort HEART: regular rate & rhythm and no murmurs and no lower extremity edema ABDOMEN:abdomen soft, non-tender and normal bowel sounds Musculoskeletal:no cyanosis of digits and no clubbing  NEURO: alert & oriented x 3 with fluent speech, no focal motor/sensory deficits  LABORATORY DATA:  I have reviewed the data as listed CBC Latest Ref Rng & Units 08/21/2021 06/26/2021 05/18/2021  WBC 4.0 -  10.5 K/uL 8.5 8.1 7.3  Hemoglobin 12.0 - 15.0 g/dL 12.1 11.5(L) 10.2(L)  Hematocrit 36.0 - 46.0 % 41.1 39.7 35.4(L)  Platelets 150 - 400 K/uL 308 293 334     CMP Latest Ref Rng & Units 08/21/2021 06/26/2021 05/18/2021  Glucose 70 - 99 mg/dL 85 94 117(H)  BUN 8 - 23 mg/dL _0 Creatinine 0.44 - 1.00 mg/dL 0.75 0.77 0.77  Sodium 135 - 145 mmol/L 142 143 142  Potassium 3.5 - 5.1 mmol/L 4.0 4.3 4.1  Chloride 98 - 111 mmol/L 109 107 107  CO2 22 - 32 mmol/L _1 Calcium 8.9 - 10.3 mg/dL 9.0 9.9 9.6  Total Protein 6.5 - 8.1 g/dL 7.3 7.5 7.0  Total Bilirubin 0.3 - 1.2 mg/dL 0.4 0.2(L) 0.2(L)  Alkaline Phos 38 - 126 U/L 144(H) 136(H) 133(H)  AST 15 - 41 U/L 15 13(L) 17  ALT 0 - 44 U/L _2 RADIOGRAPHIC STUDIES: I have personally reviewed the radiological images as listed and agreed with the findings in the report. No results found.    Orders Placed This Encounter  Procedures   CT CHEST ABDOMEN PELVIS W CONTRAST    Standing Status:   Future    Standing Expiration Date:   08/21/2022    Order Specific Question:   Preferred imaging location?    Answer:   [facility]    Order Specific Question:   Is Oral Contrast requested for this exam?    Answer:   Yes, Per Radiology protocol   All questions were answered. The patient knows to call the clinic with any problems, questions or concerns. No barriers to learning was detected. The total time spent in the appointment was 30 minutes.     Truitt Merle, MD 08/21/2021   I, Wilburn Mylar, am acting as scribe for Truitt Merle, MD.   I have reviewed the above documentation for accuracy and completeness, and I agree with the above.

## 2021-08-21 NOTE — Patient Instructions (Signed)
Will check neurocognitive evaluation Will get Home Health Assessment Follow up 6 months.

## 2021-08-22 ENCOUNTER — Encounter: Payer: Self-pay | Admitting: Neurology

## 2021-08-22 LAB — CANCER ANTIGEN 19-9: CA 19-9: <2 U/mL (ref 0–35)

## 2021-08-29 ENCOUNTER — Telehealth: Payer: Self-pay

## 2021-08-29 NOTE — Telephone Encounter (Signed)
LMOVm for [facility] Headings okay verbal orders. Different hours for the short week.

## 2021-08-29 NOTE — Telephone Encounter (Signed)
Telphone call from Amelia at [facility], TO request verbal order for Ongoing therapy for Balance.  Verbal order for  the first two weeks to be seen one day, week 3-4 2 days, weeks 5-8 patient to be seen one day.  Please advise.

## 2021-08-30 NOTE — Telephone Encounter (Signed)
[facility] Headings returned my call, Verbals given.

## 2021-09-07 ENCOUNTER — Other Ambulatory Visit (HOSPITAL_COMMUNITY): Payer: Self-pay

## 2021-09-08 ENCOUNTER — Encounter: Payer: Self-pay | Admitting: Hematology

## 2021-09-13 ENCOUNTER — Other Ambulatory Visit: Payer: Self-pay

## 2021-09-13 ENCOUNTER — Telehealth: Payer: Self-pay | Admitting: Neurology

## 2021-09-13 DIAGNOSIS — R413 Other amnesia: Secondary | ICD-10-CM

## 2021-09-13 NOTE — Progress Notes (Signed)
Pt's daughter called stating pt has had 4 to 5 episodes of diarrhea since 09/10/2021.  Pt told her daughter that when the pt was d/c from hospital the hospital provider did not recommend the pt taking OTC antidiarrhea medications d/t pt taking blood thinners.  Spoke with Dr. Burr Medico and she recommended that the pt take Immodium AD OTC and increase her fluid intake by drinking Gatorade or Pedialyte to replace the electrolytes loss with diarrhea.  Instructed pt's daughter to contact Dr. Ernestina Penna office should the pt's diarrhea continue or get worse after 2 to 3 days.  Pt's daughter verbalized understanding of instructions.  Pt's daughter had no further questions or concerns at this time.

## 2021-09-13 NOTE — Telephone Encounter (Signed)
Patient's daughter Minna Merritts called and requested an order or referral for a home nurse to help with ADL's.

## 2021-09-14 NOTE — Telephone Encounter (Signed)
Referral for home health sent to [facility] home

## 2021-09-20 ENCOUNTER — Other Ambulatory Visit: Payer: Self-pay

## 2021-10-12 ENCOUNTER — Telehealth: Payer: Self-pay | Admitting: Neurology

## 2021-10-12 NOTE — Telephone Encounter (Signed)
Left a voicemail to call the office back.

## 2021-10-12 NOTE — Telephone Encounter (Signed)
Called to see if they had reached out out reschedule PT.

## 2021-10-12 NOTE — Telephone Encounter (Signed)
Darenda from [facility] called, patient is Positive for covid, so no PT this week. Will need to get this resch [phone]

## 2021-10-13 ENCOUNTER — Other Ambulatory Visit: Payer: Self-pay

## 2021-10-13 ENCOUNTER — Emergency Department (HOSPITAL_COMMUNITY): Payer: Medicare Other

## 2021-10-13 ENCOUNTER — Encounter (HOSPITAL_COMMUNITY): Payer: Self-pay

## 2021-10-13 ENCOUNTER — Emergency Department (HOSPITAL_COMMUNITY)
Admission: EM | Admit: 2021-10-13 | Discharge: 2021-10-14 | Disposition: A | Discharge status: Home / Self Care | Payer: Medicare Other | Attending: Emergency Medicine | Admitting: Emergency Medicine

## 2021-10-13 DIAGNOSIS — Z20822 Contact with and (suspected) exposure to covid-19: Secondary | ICD-10-CM | POA: Diagnosis not present

## 2021-10-13 DIAGNOSIS — E119 Type 2 diabetes mellitus without complications: Secondary | ICD-10-CM | POA: Diagnosis not present

## 2021-10-13 DIAGNOSIS — C786 Secondary malignant neoplasm of retroperitoneum and peritoneum: Secondary | ICD-10-CM | POA: Insufficient documentation

## 2021-10-13 DIAGNOSIS — C23 Malignant neoplasm of gallbladder: Secondary | ICD-10-CM | POA: Diagnosis not present

## 2021-10-13 DIAGNOSIS — Z8616 Personal history of COVID-19: Secondary | ICD-10-CM | POA: Diagnosis not present

## 2021-10-13 DIAGNOSIS — R1084 Generalized abdominal pain: Secondary | ICD-10-CM | POA: Diagnosis present

## 2021-10-13 DIAGNOSIS — R9431 Abnormal electrocardiogram [ECG] [EKG]: Secondary | ICD-10-CM | POA: Insufficient documentation

## 2021-10-13 DIAGNOSIS — R110 Nausea: Secondary | ICD-10-CM

## 2021-10-13 LAB — URINALYSIS, MICROSCOPIC (REFLEX)

## 2021-10-13 LAB — COMPREHENSIVE METABOLIC PANEL
ALT: 27 U/L (ref 0–44)
AST: 30 U/L (ref 15–41)
Albumin: 2.5 g/dL — ABNORMAL LOW (ref 3.5–5.0)
Alkaline Phosphatase: 106 U/L (ref 38–126)
Anion gap: 15 (ref 5–15)
BUN: 9 mg/dL (ref 8–23)
CO2: 25 mmol/L (ref 22–32)
Calcium: 9.1 mg/dL (ref 8.9–10.3)
Chloride: 94 mmol/L — ABNORMAL LOW (ref 98–111)
Creatinine, Ser: 1.18 mg/dL — ABNORMAL HIGH (ref 0.44–1.00)
GFR, Estimated: 52 mL/min — ABNORMAL LOW (ref >60)
Glucose, Bld: 238 mg/dL — ABNORMAL HIGH (ref 70–99)
Potassium: 3.1 mmol/L — ABNORMAL LOW (ref 3.5–5.1)
Sodium: 134 mmol/L — ABNORMAL LOW (ref 135–145)
Total Bilirubin: 0.4 mg/dL (ref 0.3–1.2)
Total Protein: 7.6 g/dL (ref 6.5–8.1)

## 2021-10-13 LAB — URINALYSIS, ROUTINE W REFLEX MICROSCOPIC
Glucose, UA: 100 mg/dL — AB
Hgb urine dipstick: NEGATIVE
Ketones, ur: NEGATIVE mg/dL
Leukocytes,Ua: NEGATIVE
Nitrite: NEGATIVE
Protein, ur: 30 mg/dL — AB
Specific Gravity, Urine: >1.03 — ABNORMAL HIGH (ref 1.005–1.030)
pH: 6 (ref 5.0–8.0)

## 2021-10-13 LAB — CBC
HCT: 43.7 % (ref 36.0–46.0)
Hemoglobin: 12.6 g/dL (ref 12.0–15.0)
MCH: 19.4 pg — ABNORMAL LOW (ref 26.0–34.0)
MCHC: 28.8 g/dL — ABNORMAL LOW (ref 30.0–36.0)
MCV: 67.1 fL — ABNORMAL LOW (ref 80.0–100.0)
Platelets: 895 10*3/uL — ABNORMAL HIGH (ref 150–400)
RBC: 6.51 MIL/uL — ABNORMAL HIGH (ref 3.87–5.11)
RDW: 16.6 % — ABNORMAL HIGH (ref 11.5–15.5)
WBC: 16.2 10*3/uL — ABNORMAL HIGH (ref 4.0–10.5)
nRBC: 0 % (ref 0.0–0.2)

## 2021-10-13 LAB — LIPASE, BLOOD: Lipase: 29 U/L (ref 11–51)

## 2021-10-13 LAB — RESP PANEL BY RT-PCR (FLU A&B, COVID) ARPGX2
Influenza A by PCR: NEGATIVE
Influenza B by PCR: NEGATIVE
SARS Coronavirus 2 by RT PCR: NEGATIVE

## 2021-10-13 MED ORDER — HYDROMORPHONE HCL 1 MG/ML IJ SOLN
1.0000 mg | Freq: Once | INTRAMUSCULAR | Status: AC
Start: 2021-10-13 — End: 2021-10-13
  Administered 2021-10-13: 1 mg via INTRAVENOUS
  Filled 2021-10-13: qty 1

## 2021-10-13 MED ORDER — IOHEXOL 350 MG/ML SOLN
100.0000 mL | Freq: Once | INTRAVENOUS | Status: AC | PRN
  Administered 2021-10-13: 100 mL via INTRAVENOUS

## 2021-10-13 MED ORDER — ONDANSETRON 4 MG PO TBDP: 4.0000 mg | ORAL_TABLET | Freq: Once | ORAL | Status: DC | PRN

## 2021-10-13 MED ORDER — ONDANSETRON 4 MG PO TBDP: 4.0000 mg | ORAL_TABLET | Freq: Once | ORAL | Status: DC

## 2021-10-13 MED ORDER — ONDANSETRON HCL 4 MG/2ML IJ SOLN
4.0000 mg | Freq: Once | INTRAMUSCULAR | Status: AC
Start: 2021-10-13 — End: 2021-10-13
  Administered 2021-10-13: 4 mg via INTRAVENOUS
  Filled 2021-10-13: qty 2

## 2021-10-13 MED ORDER — SODIUM CHLORIDE 0.9 % IV BOLUS
1000.0000 mL | Freq: Once | INTRAVENOUS | Status: AC
  Administered 2021-10-13: 1000 mL via INTRAVENOUS

## 2021-10-13 NOTE — ED Notes (Signed)
Pt transported to ct at this time 

## 2021-10-13 NOTE — ED Triage Notes (Signed)
Pt here POV with c/o abdominal pain X2 weeks. History of ovarian cancer. Pt vomiting in triage. Unable to tolerate food and drink.

## 2021-10-13 NOTE — ED Provider Notes (Signed)
°  Physical Exam  BP (!) 123/91    Pulse (!) 117    Temp 97.8 F (36.6 C) (Oral)    Resp 19    Ht 5\' 6"  (1.676 m)    Wt 87.5 kg    SpO2 90%    BMI 31.15 kg/m     Procedures  Procedures  ED Course / MDM    Medical Decision Making  37F, hx of gallbladder cancer in remission, right ovarian mass on CT, carcinomatosis with peritoneal spread of cancer, plan for gyn-onc surgery next month at [facility], recent diagnosis of COVID, on Bactrim for UTI, presenting with failure to thrive. Tachycardic. Prescribed Roxicodone, not picked up the medication. CT Abdomen pelvis to eval for SBO. Feeling better after Dilaudid. Plan for reassessment, consider admission if symptomatically unimproved.   On my assessment, the patient's pain was symptomatically improved. She was requesting food and something to drink. Requesting something for nausea at home. Rx for Zofran provided. Overall improved following IVF resuscitation and IV opiates. Will plan to take oral opiates. Requesting discharge after discussing admission for further IV fluids and pain control. I believe this is safe as the patient is currently tolerating oral intake.        Regan Lemming, MD 10/14/21 [phone]

## 2021-10-13 NOTE — ED Provider Notes (Signed)
[facility] EMERGENCY DEPARTMENT Provider Note   CSN: [id number] Arrival date & time: 10/13/21  1259     History  Chief Complaint  Patient presents with   Abdominal Pain    Pamela Kennedy is a 66 y.o. female.  The history is provided by the patient.  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 Hx of gallbladder cancer. Relieved by:  Nothing Worsened by:  Nothing Associated symptoms: anorexia and nausea   Associated symptoms: no chest pain, no chills, no cough, no dysuria, no fever, no hematuria, no shortness of breath, no sore throat and no vomiting   Risk factors: has not had multiple surgeries       Home Medications Prior to Admission medications   Medication Sig Start Date End Date Taking? Authorizing Provider  benztropine (COGENTIN) 0.5 MG tablet Take 0.5 mg by mouth 2 (two) times daily.    [provider]  fesoterodine (TOVIAZ) 8 MG TB24 tablet Take 8 mg by mouth daily. Patient not taking: Reported on 08/21/2021    [provider]  lovastatin (ALTOPREV) 20 MG 24 hr tablet Take 20 mg by mouth at bedtime.    [provider]  metFORMIN (GLUCOPHAGE) 500 MG tablet Take by mouth 2 (two) times daily with a meal.    [provider]  raloxifene (EVISTA) 60 MG tablet Take 60 mg by mouth daily.    [provider]  risperidone (RISPERDAL) 4 MG tablet Take 4 mg by mouth daily. At bedtime    [provider]  rivaroxaban (XARELTO) 20 MG TABS tablet Take 1 tablet (20 mg total) by mouth daily with supper. 04/11/21   Alla Feeling, NP  venlafaxine (EFFEXOR) 75 MG tablet Take 75 mg by mouth daily.    [provider]  venlafaxine XR (EFFEXOR-XR) 150  MG 24 hr capsule Take 150 mg by mouth daily with breakfast.    [provider]      Allergies    Patient has no known allergies.    Review of Systems   Review of Systems  Constitutional:  Negative for chills and fever.  HENT:  Negative for ear pain and sore throat.   Eyes:  Negative for pain and visual disturbance.  Respiratory:  Negative for cough and shortness of breath.   Cardiovascular:  Negative for chest pain and palpitations.  Gastrointestinal:  Positive for abdominal distention, abdominal pain, anorexia and nausea. Negative for vomiting.  Genitourinary:  Negative for dysuria and hematuria.  Musculoskeletal:  Negative for arthralgias and back pain.  Skin:  Negative for color change and rash.  Neurological:  Negative for seizures and syncope.  All other systems reviewed and are negative.  Physical Exam Updated Vital Signs BP (!) 141/89    Pulse (!) 120    Temp 97.8 F (36.6 C) (Oral)    Resp 18    Ht 5\' 6"  (1.676 m)    Wt 87.5 kg    SpO2 100%    BMI 31.15 kg/m  Physical Exam Vitals and nursing note reviewed.  Constitutional:      General: She is in acute distress.     Appearance: She is well-developed. She is ill-appearing.  HENT:     Head: Normocephalic and atraumatic.  Eyes:  Extraocular Movements: Extraocular movements intact.     Conjunctiva/sclera: Conjunctivae normal.     Pupils: Pupils are equal, round, and reactive to light.  Cardiovascular:     Rate and Rhythm: Normal rate and regular rhythm.     Heart sounds: Normal heart sounds. No murmur heard. Pulmonary:     Effort: Pulmonary effort is normal. No respiratory distress.     Breath sounds: Normal breath sounds.  Abdominal:     Palpations: Abdomen is soft.     Tenderness: There is generalized abdominal tenderness.  Musculoskeletal:        General: No swelling.     Cervical back: Neck supple.  Skin:    General: Skin is warm and dry.     Capillary Refill: Capillary refill takes less than 2  seconds.  Neurological:     Mental Status: She is alert.  Psychiatric:        Mood and Affect: Mood normal.    ED Results / Procedures / Treatments   Labs (all labs ordered are listed, but only abnormal results are displayed) Labs Reviewed  COMPREHENSIVE METABOLIC PANEL - Abnormal; Notable for the following components:      Result Value   Sodium 134 (*)    Potassium 3.1 (*)    Chloride 94 (*)    Glucose, Bld 238 (*)    Creatinine, Ser 1.18 (*)    Albumin 2.5 (*)    GFR, Estimated 52 (*)    All other components within normal limits  CBC - Abnormal; Notable for the following components:   WBC 16.2 (*)    RBC 6.51 (*)    MCV 67.1 (*)    MCH 19.4 (*)    MCHC 28.8 (*)    RDW 16.6 (*)    Platelets 895 (*)    All other components within normal limits  URINALYSIS, ROUTINE W REFLEX MICROSCOPIC - Abnormal; Notable for the following components:   Color, Urine AMBER (*)    APPearance HAZY (*)    Specific Gravity, Urine >1.030 (*)    Glucose, UA 100 (*)    Bilirubin Urine MODERATE (*)    Protein, ur 30 (*)    All other components within normal limits  URINALYSIS, MICROSCOPIC (REFLEX) - Abnormal; Notable for the following components:   Bacteria, UA MANY (*)    All other components within normal limits  RESP PANEL BY RT-PCR (FLU A&B, COVID) ARPGX2  LIPASE, BLOOD    EKG EKG Interpretation  Date/Time:  Friday October 13 2021 16:36:34 EST Ventricular Rate:  130 PR Interval:  136 QRS Duration: 80 QT Interval:  308 QTC Calculation: 453 R Axis:   -11 Text Interpretation: Sinus tachycardia Anterior infarct , age undetermined Abnormal ECG No previous ECGs available Confirmed by Lennice Sites [phone]) on 10/13/2021 8:18:49 PM  Radiology No results found.  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  Arrives with  unremarkable vitals except for tachycardia.  She is having nausea and abdominal pain.  Patient is scheduled to undergo surgery with gynecology oncology next month at [facility].  She was diagnosed with COVID-19 last week and possibly urinary tract infection has been on Bactrim.  I have extensively reviewed her past medical charts including her OB/GYN charts and her most recent emergency department evaluation.  She had a CT scan done last week that showed progression of right-sided ovarian mass now including left ovarian mass and may be a lesion in her liver.  She was given narcotic pain medicine at discharge from emergency department last week but has not been using the medication.  Over the last day or 2 she is not able to eat or drink anything as she is too nauseous.  Abdominal discomfort has been fairly persistent.  She feels more distended.  She did have ascites and omental/peritoneal spread found on at last CT scan.  Overall suspect ongoing oncology process.  However could be bowel obstruction, ongoing urine infection or dehydration.  We will get lab work including CT scan abdomen and pelvis.  We will give IV antiemetics and IV narcotic pain medicine.  10:48 PM patient reevaluated and feeling slightly better after IV antiemetics and IV pain medicine.  Still uncomfortable.  Lab work has been reviewed and shows mild hyperglycemia.  We will start IV fluids for that.  She is not in DKA.  Lab work is otherwise unremarkable.  Creatinine mildly elevated.  She has a leukocytosis of 16 but upon chart review this is been fairly chronic here recently.  Urinalysis does not appear consistent with  infection.  Patient awaiting CT scan of her abdomen and pelvis at time of handoff to oncoming ED staff.  Please see their note for further results, evaluation, disposition of the patient.  This chart was dictated using voice recognition software.  Despite best efforts to proofread,  errors can occur which can change the documentation meaning.          Final Clinical Impression(s) / ED Diagnoses Final diagnoses:  Generalized abdominal pain  Nausea    Rx / DC Orders ED Discharge Orders     None         Lennice Sites, DO 10/13/21 2248

## 2021-10-14 DIAGNOSIS — C23 Malignant neoplasm of gallbladder: Secondary | ICD-10-CM | POA: Diagnosis not present

## 2021-10-14 MED ORDER — ONDANSETRON 4 MG PO TBDP
4.0000 mg | ORAL_TABLET | Freq: Three times a day (TID) | ORAL | 0 refills | Status: AC | PRN
Start: 2021-10-14 — End: ?

## 2021-10-14 MED ORDER — FLEET ENEMA 7-19 GM/118ML RE ENEM
1.0000 | ENEMA | Freq: Once | RECTAL | Status: AC
  Administered 2021-10-14: 1 via RECTAL
  Filled 2021-10-14: qty 1

## 2021-10-14 MED ORDER — GLYCERIN (LAXATIVE) 2 G RE SUPP
1.0000 | Freq: Once | RECTAL | Status: AC
  Administered 2021-10-14: 1 via RECTAL
  Filled 2021-10-14: qty 1

## 2021-10-14 MED ORDER — CEPHALEXIN 500 MG PO CAPS: 500.0000 mg | ORAL_CAPSULE | Freq: Four times a day (QID) | ORAL | 0 refills | Status: AC

## 2021-10-14 NOTE — Discharge Instructions (Addendum)
Please follow-up with your Gynecologist Oncologist outpatient. Zofran has been provided for nausea. Take your home opiate pain medication. Return in the event of inability to tolerate oral intake, worsening pain requiring IV pain medication.

## 2021-10-14 NOTE — ED Notes (Signed)
Provider at bedside

## 2021-10-16 ENCOUNTER — Other Ambulatory Visit: Payer: Self-pay | Admitting: Nurse Practitioner

## 2021-10-18 ENCOUNTER — Other Ambulatory Visit: Payer: Self-pay

## 2021-10-18 ENCOUNTER — Inpatient Hospital Stay: Payer: Medicare Other

## 2021-10-18 ENCOUNTER — Inpatient Hospital Stay: Payer: Medicare Other | Attending: Hematology | Admitting: Hematology

## 2021-10-18 ENCOUNTER — Other Ambulatory Visit (HOSPITAL_COMMUNITY): Payer: Self-pay

## 2021-10-18 ENCOUNTER — Encounter: Payer: Self-pay | Admitting: Hematology

## 2021-10-18 ENCOUNTER — Telehealth: Payer: Self-pay

## 2021-10-18 VITALS — BP 125/84 | HR 106

## 2021-10-18 VITALS — BP 139/90 | HR 113 | Temp 97.7°F | Resp 19 | Ht 66.0 in | Wt 197.8 lb

## 2021-10-18 DIAGNOSIS — A419 Sepsis, unspecified organism: Secondary | ICD-10-CM | POA: Diagnosis not present

## 2021-10-18 DIAGNOSIS — E860 Dehydration: Secondary | ICD-10-CM

## 2021-10-18 DIAGNOSIS — C23 Malignant neoplasm of gallbladder: Secondary | ICD-10-CM | POA: Diagnosis not present

## 2021-10-18 DIAGNOSIS — N179 Acute kidney failure, unspecified: Secondary | ICD-10-CM | POA: Diagnosis not present

## 2021-10-18 MED ORDER — MEGESTROL ACETATE 625 MG/5ML PO SUSP: 625.0000 mg | Freq: Every day | ORAL | 0 refills | Status: DC

## 2021-10-18 MED ORDER — SODIUM CHLORIDE 0.9 % IV SOLN: INTRAVENOUS | Status: AC

## 2021-10-18 MED ORDER — TRAMADOL HCL 50 MG PO TABS: 50.0000 mg | ORAL_TABLET | Freq: Four times a day (QID) | ORAL | 0 refills | Status: AC | PRN

## 2021-10-18 MED ORDER — PROCHLORPERAZINE MALEATE 5 MG PO TABS: 5.0000 mg | ORAL_TABLET | Freq: Four times a day (QID) | ORAL | 0 refills | Status: AC | PRN

## 2021-10-18 MED ORDER — SODIUM CHLORIDE 0.9 % IV SOLN: INTRAVENOUS | Status: DC

## 2021-10-18 NOTE — Progress Notes (Signed)
Pamela Kennedy   [phone] [phone]   Clinic Follow up Note   Patient Care Team: System, Provider Not In as PCP - General [name], MD (Psychiatry) Laretta Bolster, NP as Nurse Practitioner (Internal Medicine) Truitt Merle, MD as Consulting Physician (Oncology) Jonnie Finner, RN (Inactive) as Oncology Nurse Navigator  Date of Service:  10/18/2021  CHIEF COMPLAINT: f/u of gallbladder cancer  CURRENT THERAPY:  Surveillance  ASSESSMENT & PLAN:  Pamela Kennedy is a 66 y.o. female with   1. Symptom Management: abdominal pain, low appetite, nausea, dehydration -she is symptomatic from the new peritoneal masses. I prescribed tramadol for pain. -she also reports nausea and low appetite. I prescribed megace and compazine today. We will also give her IVF today.  2. Gallbladder cancer cancer, pT3N1M0, stage IIIB -She had stage IIIb gallbladder cancer diagnosed in 06/2020, which was removed completely by 2 surgeries, with negative margins. Last surgery in 09/2020. -I started her on adjuvant Xeloda 1065m/m2 bid day 1-14 every 21 days for 6 months on 11/14/20. Given decreased tolerance, I reduced and postponed her Xeloda to 15072mBID starting cycle 4 on 01/23/21. -she was hospitalized for mental status change after cycle 4 chemo, and had a prolonged hospital stay. Xeloda held since then. She is recovering well now. I do not plan to restart Xeloda due to her poor tolerance.  -CT CAP w/o contrast (due to vein issues) on 05/18/21 showed no evidence of recurrence, adenopathy, or metastasis.  -She is now on cancer surveillance.  -two new pelvic masses were seen on CT AP in the ED on 10/13/21-- a 4.5 cm mass posterior to uterus and 16 cm mass in right lower abdomen contiguous with right adnexa. This is highly suspicious for malignancy, ovarian cancer vs recurrent gallbladder cancer to peritoneum  -she [facility]. They plan  for biopsy, but this is not currently schedule. Her daughter notes there is a "pScientist, clinical (histocompatibility and immunogenetics)for surgery on 11/17/21.  -I will reach out to Dr. LaDionne Miloo discuss her biopsy and surgery    3. LE Edema and DVT -doppler 02/01/21 showed b/l DVT, she was started on Xarelto at that time. Held 5/1-6/19/22 due to GI bleed. -doppler 04/28/21 showed resolution of RLE DVT, persistent LLE DVT -most recent doppler 06/30/21 showed continued LLE DVT -We will continue Xarelto 20 mg daily indefinitely -mostly resolved   4. 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 JaTomi Likensn neurology on 08/21/21   5. Genetics testing: Her 11/2020 results were negative for pathogenetic mutations.    6. 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 DM, Hyperglycemia, Depression -She is on metformin for her DM. She is currently trying to find a new PCP. -For her depression, she will f/u with her psychiatrist.    8. Tobacco Use -She previously used snuff/dip. She and her daughter reports she is no longer using. I encouraged her to continue cessation, as the nicotine can cause blood clots.  9. Covid-19+ 10/06/21      PLAN:  -proceed with IVF today -I called in tramadol, compazine, and megace -I will discuss her case with Dr. LaDionne Miloegarding her biopsy and surgery    No problem-specific Assessment & Plan notes found for this encounter.   SUMMARY OF ONCOLOGIC HISTORY: Oncology  History Overview Note  Cancer Staging Primary gall bladder adenocarcinoma ([place]) Staging form: Gallbladder, AJCC 8th Edition - Pathologic stage from 09/20/2020: Stage IIIB (pT3, pN1, cM0) - Signed by Truitt Merle, MD on 11/03/2020 Histologic grade (G): G3 Histologic grading system: 3 grade system Residual tumor (R): R0 - None Histologic  sub-type: Adenocarcinoma    Primary gall bladder adenocarcinoma ([place])  07/05/2020 Initial Biopsy   Cholecystectomy by Dr Moreen Fowler  Final Diagnosis A. Gallbladder, Cholecystectomy:  -Invasive adenocarcinoma, moderately to poorly-differentiated, infiltrating into the perimuscular fibroconnective tissue.  -Multifocal perineural invasion identified.  -The cystic duct margin is negative for carcinoma.  -Gallbladder with acute necrotizing cholecystitis and cholelithiasis.     08/17/2020 Imaging   MRI Abdomen at [facility]  IMPRESSION -Limited evaluation secondary to motion and use of motion insensitive sequences with suboptimal arterial phase timing.   -Multiloculated collection within the gallbladder fossa with subtle peripheral enhancement may represent a postoperative seroma versus biloma. An abscess is thought to be unlikely.   -Prominent portacaval nodes, indeterminate and possibly reactive.   -Asymmetric left breast tissue likely fibroglandular tissue. Recommend  correlation with mammogram     08/25/2020 Imaging   CT Chest 08/25/20 at [facility] IMPRESSION:   No evidence of intrathoracic metastatic disease.   Focal nodular asymmetries in the left breast requiring follow-up to exclude neoplastic etiology.   Recent cholecystectomy with slight inflammatory stranding at the gallbladder fossa.   Probable thyroid goiter with mild enlargement and hypodense lesions.   09/20/2020 Cancer Staging   Staging form: Gallbladder, AJCC 8th Edition - Pathologic stage from 09/20/2020: Stage IIIB (pT3, pN1, cM0) - Signed by Truitt Merle, MD on 11/03/2020    09/20/2020 Surgery   Surgery of liver and abdomen by Dr Olen Pel at [facility]    09/20/2020 Pathology Results    Final Diagnosis   A: Lymph nodes, common and proper hepatic, lymphadenectomy: -One of seven lymph nodes, positive for adenocarcinoma. (1/7) -The lymph nodes show nonnecrotizing granulomatous reaction.   B: Lymph nodes, hepatic duodenal,  lymphadenectomy: -One of two lymph nodes, positive for adenocarcinoma. (1/2)   C: Cystic duct, biopsy: -Bile duct, negative for carcinoma.   D: Liver, partial hepatectomy (gallbladder fossa): -Adenocarcinoma, poorly-differentiated, 1.0 cm in size,  infiltrating liver. -The cauterized liver parenchymal resection margin is negative for carcinoma.  (See comment)   11/03/2020 Initial Diagnosis   Primary gall bladder adenocarcinoma ([place])   11/08/2020 Tumor Marker   CA 19-9 - less than 2   11/14/2020 -  Chemotherapy   Xeloda 2025m Bid for day 1-14 every 21 days starting on 11/14/20    11/25/2020 Genetic Testing   Negative genetic testing:  No pathogenic variants detected on the [facility] Multi-Cancer + RNA panel. The report date is 11/25/2020.   The Multi-Cancer + RNA Panel offered by [facility] includes sequencing and/or deletion/duplication analysis of the following 84 genes:  AIP*, ALK, APC*, ATM*, AXIN2*, BAP1*, BARD1*, BLM*, BMPR1A*, BRCA1*, BRCA2*, BRIP1*, CASR, CDC73*, CDH1*, CDK4, CDKN1B*, CDKN1C*, CDKN2A, CEBPA, CHEK2*, CTNNA1*, DICER1*, DIS3L2*, EGFR, EPCAM, FH*, FLCN*, GATA2*, GPC3, GREM1, HOXB13, HRAS, KIT, MAX*, MEN1*, MET, MITF, MLH1*, MSH2*, MSH3*, MSH6*, MUTYH*, NBN*, NF1*, NF2*, NTHL1*, PALB2*, PDGFRA, PHOX2B, PMS2*, POLD1*, POLE*, POT1*, PRKAR1A*, PTCH1*, PTEN*, RAD50*, RAD51C*, RAD51D*, RB1*, RECQL4, RET, RUNX1*, SDHA*, SDHAF2*, SDHB*, SDHC*, SDHD*, SMAD4*, SMARCA4*, SMARCB1*, SMARCE1*, STK11*, SUFU*, TERC, TERT, TMEM127*, Tp53*, TSC1*, TSC2*, VHL*, WRN*, and WT1.  RNA analysis is performed for * genes.    02/06/2021 Imaging   CT AP with [facility]  Fluid-filled colon. Diarrhea?  No  pneumatosis or vein gas identified. No abscess. Patent mesenteric arteries and veins.   Mild bilateral hydronephroureter without obstructing calculus. Urinary bladder distention   02/06/2021 Imaging   US Liver at [facility]  Mildly heterogeneous and mild-to-moderately increased hepatic echotexture suggestive of  fatty infiltration of the liver. No evidence of hepatic mass or intrahepatic or extrahepatic biliary ductal dilatation. Nonvisualized gallbladder consistent with prior cholecystectomy. Otherwise unremarkable exam.    02/06/2021 Imaging   MRI Brain at [facility]  CONCLUSION:  1. Mild cortical neuronal volume loss with mild white matter small vessel angiopathy without acute intracranial abnormality.    Addendum:  Stable left posterior lateral parietal lobe 2.1 x 1.2 cm extra-axial  meningioma series 6:16.  The mass is not well demarcated on the FLAIR and T1 weighted series  secondary to patient motion.    02/07/2021 Procedure   Upper Endoscopy at [facility]  IMPRESSION - Normal esophagus.                         - Gastric mucosal atrophy.                         - Normal examined duodenum.                         - No specimens collected   05/18/2021 Imaging   CT CAP w/o contrast (d/t vein issues)  IMPRESSION: Chest Impression: 1. No evidence of thoracic metastasis. 2. Coronary artery calcification and Aortic Atherosclerosis (ICD10-I70.0).   Abdomen / Pelvis Impression: 1. No evidence of local recurrence of gallbladder carcinoma. No IV contrast was administered as described in the technique section. 2. No adenopathy in the abdomen pelvis.  No peritoneal metastasis. 3. No skeletal metastasis identified.   10/13/2021 Imaging   EXAM: CT ABDOMEN AND PELVIS WITH CONTRAST  IMPRESSION: 1. Large right lower quadrant and pelvic masses most consistent with malignancy, possibly ovarian in origin. 2. Small ascites, new since the prior CT. 3. No bowel obstruction. Normal appendix.      INTERVAL HISTORY:  Alonzo Loving is here for a follow up of gallbladder cancer. She was last seen by me on 08/21/21. She presents to the clinic accompanied by her daughter. She reports she is tired today. Her daughter adds that Joyleen is not eating enough. She also reports abdominal pain.   All other  systems were reviewed with the patient and are negative.  MEDICAL HISTORY:  Past Medical History:  Diagnosis Date   Depression    Diabetes mellitus without complication ([place])    Family history of breast cancer    Family history of prostate cancer    Overactive bladder     SURGICAL HISTORY: Past Surgical History:  Procedure Laterality Date   CHOLECYSTECTOMY      I have reviewed the social history and family history with the patient and they are unchanged from previous note.  ALLERGIES:  has No Known Allergies.  MEDICATIONS:  Current Outpatient Medications  Medication Sig Dispense Refill   megestrol (MEGACE ES) 625 MG/5ML suspension Take 5 mLs (625 mg total) by mouth daily. 150 mL 0   prochlorperazine (COMPAZINE) 5 MG tablet Take 1-2 tablets (5-10 mg total) by mouth every 6 (six) hours as needed for nausea or vomiting. 30 tablet 0   traMADol (ULTRAM) 50 MG tablet Take 1 tablet (50 mg total) by mouth every 6 (six) hours as needed. 30 tablet 0  benztropine (COGENTIN) 0.5 MG tablet Take 0.5 mg by mouth 2 (two) times daily.     cephALEXin (KEFLEX) 500 MG capsule Take 1 capsule (500 mg total) by mouth 4 (four) times daily. 20 capsule 0   fesoterodine (TOVIAZ) 8 MG TB24 tablet Take 8 mg by mouth daily. (Patient not taking: Reported on 08/21/2021)     lovastatin (ALTOPREV) 20 MG 24 hr tablet Take 20 mg by mouth at bedtime.     metFORMIN (GLUCOPHAGE) 500 MG tablet Take by mouth 2 (two) times daily with a meal.     ondansetron (ZOFRAN-ODT) 4 MG disintegrating tablet Take 1 tablet (4 mg total) by mouth every 8 (eight) hours as needed for nausea or vomiting. 20 tablet 0   raloxifene (EVISTA) 60 MG tablet Take 60 mg by mouth daily.     risperidone (RISPERDAL) 4 MG tablet Take 4 mg by mouth daily. At bedtime     venlafaxine (EFFEXOR) 75 MG tablet Take 75 mg by mouth daily.     venlafaxine XR (EFFEXOR-XR) 150 MG 24 hr capsule Take 150 mg by mouth daily with breakfast.     XARELTO 20 MG TABS  tablet TAKE 1 TABLET BY MOUTH DAILY WITH EVENING MEAL 30 tablet 3   No current facility-administered medications for this visit.    PHYSICAL EXAMINATION: ECOG PERFORMANCE STATUS: 3 - Symptomatic, >50% confined to bed  Vitals:   10/18/21 1306  BP: 139/90  Pulse: (!) 113  Resp: 19  Temp: 97.7 F (36.5 C)  SpO2: 96%   Wt Readings from Last 3 Encounters:  10/18/21 197 lb 12.8 oz (89.7 kg)  10/13/21 193 lb (87.5 kg)  08/21/21 202 lb 9.6 oz (91.9 kg)     GENERAL:alert, no distress and comfortable SKIN: skin color, texture, turgor are normal, no rashes or significant lesions EYES: normal, Conjunctiva are pink and non-injected, sclera clear  NECK: supple, thyroid normal size, non-tender, without nodularity LYMPH:  no palpable lymphadenopathy in the cervical, axillary  LUNGS: clear to auscultation and percussion with normal breathing effort HEART: regular rate & rhythm and no murmurs and no lower extremity edema ABDOMEN: (+) distended, with a large abdominal mass in the right side, mild tenderness, bowel sound normal. Musculoskeletal:no cyanosis of digits and no clubbing  NEURO: alert & oriented x 3 with fluent speech, no focal motor/sensory deficits  LABORATORY DATA:  I have reviewed the data as listed CBC Latest Ref Rng & Units 10/13/2021 08/21/2021 06/26/2021  WBC 4.0 - 10.5 K/uL 16.2(H) 8.5 8.1  Hemoglobin 12.0 - 15.0 g/dL 12.6 12.1 11.5(L)  Hematocrit 36.0 - 46.0 % 43.7 41.1 39.7  Platelets 150 - 400 K/uL 895(H) 308 293     CMP Latest Ref Rng & Units 10/13/2021 08/21/2021 06/26/2021  Glucose 70 - 99 mg/dL 238(H) 85 94  BUN 8 - 23 mg/dL _0 Creatinine 0.44 - 1.00 mg/dL 1.18(H) 0.75 0.77  Sodium 135 - 145 mmol/L 134(L) 142 143  Potassium 3.5 - 5.1 mmol/L 3.1(L) 4.0 4.3  Chloride 98 - 111 mmol/L 94(L) 109 107  CO2 22 - 32 mmol/L _1 Calcium 8.9 - 10.3 mg/dL 9.1 9.0 9.9  Total Protein 6.5 - 8.1 g/dL 7.6 7.3 7.5  Total Bilirubin 0.3 - 1.2 mg/dL 0.4 0.4 0.2(L)   Alkaline Phos 38 - 126 U/L 106 144(H) 136(H)  AST 15 - 41 U/L 30 15 13(L)  ALT 0 - 44 U/L _2 RADIOGRAPHIC STUDIES: I  have personally reviewed the radiological images as listed and agreed with the findings in the report. No results found.    Orders Placed This Encounter  Procedures   Basic Metabolic Panel - [place] Only    Standing Status:   Future    Standing Expiration Date:   10/18/2022   All questions were answered. The patient knows to call the clinic with any problems, questions or concerns. No barriers to learning was detected. The total time spent in the appointment was 30 minutes.     Truitt Merle, MD 10/18/2021   I, Wilburn Mylar, am acting as scribe for Truitt Merle, MD.   I have reviewed the above documentation for accuracy and completeness, and I agree with the above.

## 2021-10-18 NOTE — Telephone Encounter (Signed)
Pt's daughter LVM stating pt fell at home on Friday 10/13/2021 and was taken to the ED at [facility].  A series of test was ran on pt and found that the pt has increased abdominal fluid & a new stomach mass.  Pt's daughter stated that the pt has decreased eating and drinking.  Pt's daughter wants to know if her mom can be seen by Dr. Burr Medico today.  Notified Dr. Burr Medico of the pt's daughter request and Dr. Burr Medico agreed to see pt today in clinic.  Returned pt's daughter's call to confirm appt for today.

## 2021-10-18 NOTE — Patient Instructions (Signed)

## 2021-10-18 NOTE — Progress Notes (Signed)
Verbal order from Dr. Burr Medico for 1L NS over 2hrs.

## 2021-10-19 ENCOUNTER — Other Ambulatory Visit (HOSPITAL_COMMUNITY): Payer: Self-pay

## 2021-10-19 ENCOUNTER — Other Ambulatory Visit: Payer: Self-pay

## 2021-10-19 MED ORDER — MEGESTROL ACETATE 40 MG/ML PO SUSP
ORAL | 0 refills | Status: AC
Start: 2021-10-18 — End: ?
  Filled 2021-10-19: qty 110, 7d supply, fill #0
  Filled 2021-10-20: qty 358, 23d supply, fill #0

## 2021-10-19 NOTE — Progress Notes (Signed)
Spoke with pt's daughter regarding Pamela Kennedy will be contacting her regarding appt for biopsy.  Pt's daughter stated [facility] contacted her today and has her mom scheduled for 10/25/2021 @1300 .  Dr. Burr Medico would like a follow-up appt with the pt a wk after the biopsy is completed.

## 2021-10-20 ENCOUNTER — Other Ambulatory Visit: Payer: Self-pay | Admitting: Hematology

## 2021-10-20 ENCOUNTER — Encounter: Payer: Self-pay | Admitting: Hematology

## 2021-10-20 ENCOUNTER — Telehealth: Payer: Self-pay

## 2021-10-20 ENCOUNTER — Other Ambulatory Visit (HOSPITAL_COMMUNITY): Payer: Self-pay

## 2021-10-20 NOTE — Telephone Encounter (Signed)
Pt's daughter was called regarding pt getting another fluid infusion since it helped her so much her last visit. She was told she could be scheduled for fluids tomorrow at 47 and daughter verbalized they can come. Daughter also expresses concerns about her mother still losing weight and not eating as much. Daughter stated that the pt is still arousal and did eat strawberries and peanut butter this morning. I encouraged the daughter to continue asking her every hour to eat and drink fluids. I also told daughter if she feels like her mother is declining more than she should take her to the emergency room. She verbalized understanding and thanked this LPN for the encouragement.

## 2021-10-21 ENCOUNTER — Encounter (HOSPITAL_COMMUNITY): Payer: Self-pay | Admitting: Emergency Medicine

## 2021-10-21 ENCOUNTER — Other Ambulatory Visit: Payer: Self-pay

## 2021-10-21 ENCOUNTER — Inpatient Hospital Stay: Payer: Medicare Other

## 2021-10-21 ENCOUNTER — Inpatient Hospital Stay (HOSPITAL_COMMUNITY)
Admission: EM | Admit: 2021-10-21 | Discharge: 2021-11-08 | DRG: 871 | Disposition: E | Payer: Medicare Other | Attending: Family Medicine | Admitting: Family Medicine

## 2021-10-21 ENCOUNTER — Inpatient Hospital Stay: Payer: Self-pay

## 2021-10-21 ENCOUNTER — Emergency Department (HOSPITAL_COMMUNITY): Payer: Medicare Other

## 2021-10-21 DIAGNOSIS — R188 Other ascites: Secondary | ICD-10-CM | POA: Diagnosis present

## 2021-10-21 DIAGNOSIS — Z66 Do not resuscitate: Secondary | ICD-10-CM | POA: Diagnosis present

## 2021-10-21 DIAGNOSIS — E872 Acidosis, unspecified: Secondary | ICD-10-CM | POA: Diagnosis present

## 2021-10-21 DIAGNOSIS — J9601 Acute respiratory failure with hypoxia: Secondary | ICD-10-CM | POA: Diagnosis present

## 2021-10-21 DIAGNOSIS — K7460 Unspecified cirrhosis of liver: Secondary | ICD-10-CM | POA: Diagnosis present

## 2021-10-21 DIAGNOSIS — N839 Noninflammatory disorder of ovary, fallopian tube and broad ligament, unspecified: Secondary | ICD-10-CM | POA: Diagnosis present

## 2021-10-21 DIAGNOSIS — C23 Malignant neoplasm of gallbladder: Secondary | ICD-10-CM | POA: Diagnosis present

## 2021-10-21 DIAGNOSIS — C786 Secondary malignant neoplasm of retroperitoneum and peritoneum: Secondary | ICD-10-CM | POA: Diagnosis present

## 2021-10-21 DIAGNOSIS — F32A Depression, unspecified: Secondary | ICD-10-CM | POA: Diagnosis present

## 2021-10-21 DIAGNOSIS — E43 Unspecified severe protein-calorie malnutrition: Secondary | ICD-10-CM | POA: Diagnosis present

## 2021-10-21 DIAGNOSIS — G9341 Metabolic encephalopathy: Secondary | ICD-10-CM | POA: Diagnosis present

## 2021-10-21 DIAGNOSIS — Z7901 Long term (current) use of anticoagulants: Secondary | ICD-10-CM

## 2021-10-21 DIAGNOSIS — Z515 Encounter for palliative care: Secondary | ICD-10-CM | POA: Diagnosis not present

## 2021-10-21 DIAGNOSIS — Z86011 Personal history of benign neoplasm of the brain: Secondary | ICD-10-CM

## 2021-10-21 DIAGNOSIS — Z20822 Contact with and (suspected) exposure to covid-19: Secondary | ICD-10-CM | POA: Diagnosis present

## 2021-10-21 DIAGNOSIS — N390 Urinary tract infection, site not specified: Secondary | ICD-10-CM | POA: Diagnosis present

## 2021-10-21 DIAGNOSIS — N3281 Overactive bladder: Secondary | ICD-10-CM | POA: Diagnosis present

## 2021-10-21 DIAGNOSIS — Z86718 Personal history of other venous thrombosis and embolism: Secondary | ICD-10-CM

## 2021-10-21 DIAGNOSIS — Z7189 Other specified counseling: Secondary | ICD-10-CM | POA: Diagnosis not present

## 2021-10-21 DIAGNOSIS — E8770 Fluid overload, unspecified: Secondary | ICD-10-CM | POA: Diagnosis present

## 2021-10-21 DIAGNOSIS — Z8673 Personal history of transient ischemic attack (TIA), and cerebral infarction without residual deficits: Secondary | ICD-10-CM

## 2021-10-21 DIAGNOSIS — E1165 Type 2 diabetes mellitus with hyperglycemia: Secondary | ICD-10-CM | POA: Diagnosis present

## 2021-10-21 DIAGNOSIS — J189 Pneumonia, unspecified organism: Secondary | ICD-10-CM

## 2021-10-21 DIAGNOSIS — Z79899 Other long term (current) drug therapy: Secondary | ICD-10-CM

## 2021-10-21 DIAGNOSIS — E669 Obesity, unspecified: Secondary | ICD-10-CM | POA: Diagnosis present

## 2021-10-21 DIAGNOSIS — E860 Dehydration: Secondary | ICD-10-CM | POA: Diagnosis present

## 2021-10-21 DIAGNOSIS — N179 Acute kidney failure, unspecified: Secondary | ICD-10-CM | POA: Diagnosis present

## 2021-10-21 DIAGNOSIS — J810 Acute pulmonary edema: Secondary | ICD-10-CM | POA: Diagnosis present

## 2021-10-21 DIAGNOSIS — Z7984 Long term (current) use of oral hypoglycemic drugs: Secondary | ICD-10-CM

## 2021-10-21 DIAGNOSIS — A419 Sepsis, unspecified organism: Principal | ICD-10-CM | POA: Diagnosis present

## 2021-10-21 DIAGNOSIS — R1900 Intra-abdominal and pelvic swelling, mass and lump, unspecified site: Secondary | ICD-10-CM

## 2021-10-21 DIAGNOSIS — E871 Hypo-osmolality and hyponatremia: Secondary | ICD-10-CM | POA: Diagnosis present

## 2021-10-21 DIAGNOSIS — Z6835 Body mass index (BMI) 35.0-35.9, adult: Secondary | ICD-10-CM | POA: Diagnosis not present

## 2021-10-21 DIAGNOSIS — F17200 Nicotine dependence, unspecified, uncomplicated: Secondary | ICD-10-CM | POA: Diagnosis present

## 2021-10-21 DIAGNOSIS — R4182 Altered mental status, unspecified: Secondary | ICD-10-CM

## 2021-10-21 DIAGNOSIS — D75839 Thrombocytosis, unspecified: Secondary | ICD-10-CM | POA: Diagnosis present

## 2021-10-21 DIAGNOSIS — Z803 Family history of malignant neoplasm of breast: Secondary | ICD-10-CM

## 2021-10-21 DIAGNOSIS — E119 Type 2 diabetes mellitus without complications: Secondary | ICD-10-CM

## 2021-10-21 DIAGNOSIS — I444 Left anterior fascicular block: Secondary | ICD-10-CM | POA: Diagnosis present

## 2021-10-21 DIAGNOSIS — E861 Hypovolemia: Secondary | ICD-10-CM | POA: Diagnosis present

## 2021-10-21 DIAGNOSIS — Z8042 Family history of malignant neoplasm of prostate: Secondary | ICD-10-CM

## 2021-10-21 LAB — TROPONIN I (HIGH SENSITIVITY)
Troponin I (High Sensitivity): 4 ng/L (ref <18)
Troponin I (High Sensitivity): 5 ng/L (ref <18)

## 2021-10-21 LAB — CBC WITH DIFFERENTIAL/PLATELET
Abs Immature Granulocytes: 0.18 10*3/uL — ABNORMAL HIGH (ref 0.00–0.07)
Basophils Absolute: 0 10*3/uL (ref 0.0–0.1)
Basophils Relative: 0 %
Eosinophils Absolute: 0 10*3/uL (ref 0.0–0.5)
Eosinophils Relative: 0 %
HCT: 43.4 % (ref 36.0–46.0)
Hemoglobin: 12.9 g/dL (ref 12.0–15.0)
Immature Granulocytes: 1 %
Lymphocytes Relative: 5 %
Lymphs Abs: 0.9 10*3/uL (ref 0.7–4.0)
MCH: 19.4 pg — ABNORMAL LOW (ref 26.0–34.0)
MCHC: 29.7 g/dL — ABNORMAL LOW (ref 30.0–36.0)
MCV: 65.4 fL — ABNORMAL LOW (ref 80.0–100.0)
Monocytes Absolute: 1.2 10*3/uL — ABNORMAL HIGH (ref 0.1–1.0)
Monocytes Relative: 6 %
Neutro Abs: 17.1 10*3/uL — ABNORMAL HIGH (ref 1.7–7.7)
Neutrophils Relative %: 88 %
Platelets: 969 10*3/uL (ref 150–400)
RBC: 6.64 MIL/uL — ABNORMAL HIGH (ref 3.87–5.11)
RDW: 17.3 % — ABNORMAL HIGH (ref 11.5–15.5)
WBC: 19.4 10*3/uL — ABNORMAL HIGH (ref 4.0–10.5)
nRBC: 0.1 % (ref 0.0–0.2)

## 2021-10-21 LAB — URINALYSIS, ROUTINE W REFLEX MICROSCOPIC
Bilirubin Urine: NEGATIVE
Glucose, UA: 50 mg/dL — AB
Hgb urine dipstick: NEGATIVE
Ketones, ur: 5 mg/dL — AB
Nitrite: NEGATIVE
Protein, ur: 100 mg/dL — AB
Specific Gravity, Urine: 1.027 (ref 1.005–1.030)
pH: 5 (ref 5.0–8.0)

## 2021-10-21 LAB — COMPREHENSIVE METABOLIC PANEL
ALT: 39 U/L (ref 0–44)
AST: 50 U/L — ABNORMAL HIGH (ref 15–41)
Albumin: 2.6 g/dL — ABNORMAL LOW (ref 3.5–5.0)
Alkaline Phosphatase: 107 U/L (ref 38–126)
Anion gap: 16 — ABNORMAL HIGH (ref 5–15)
BUN: 32 mg/dL — ABNORMAL HIGH (ref 8–23)
CO2: 24 mmol/L (ref 22–32)
Calcium: 8.8 mg/dL — ABNORMAL LOW (ref 8.9–10.3)
Chloride: 87 mmol/L — ABNORMAL LOW (ref 98–111)
Creatinine, Ser: 1.82 mg/dL — ABNORMAL HIGH (ref 0.44–1.00)
GFR, Estimated: 31 mL/min — ABNORMAL LOW (ref >60)
Glucose, Bld: 341 mg/dL — ABNORMAL HIGH (ref 70–99)
Potassium: 4.2 mmol/L (ref 3.5–5.1)
Sodium: 127 mmol/L — ABNORMAL LOW (ref 135–145)
Total Bilirubin: 0.9 mg/dL (ref 0.3–1.2)
Total Protein: 7.4 g/dL (ref 6.5–8.1)

## 2021-10-21 LAB — LACTIC ACID, PLASMA
Lactic Acid, Venous: 3 mmol/L (ref 0.5–1.9)
Lactic Acid, Venous: 1.8 mmol/L (ref 0.5–1.9)

## 2021-10-21 LAB — CBG MONITORING, ED
Glucose-Capillary: 317 mg/dL — ABNORMAL HIGH (ref 70–99)
Glucose-Capillary: 292 mg/dL — ABNORMAL HIGH (ref 70–99)

## 2021-10-21 LAB — LIPASE, BLOOD: Lipase: 33 U/L (ref 11–51)

## 2021-10-21 LAB — HEMOGLOBIN A1C
Hgb A1c MFr Bld: 7.6 % — ABNORMAL HIGH (ref 4.8–5.6)
Mean Plasma Glucose: 171.42 mg/dL

## 2021-10-21 LAB — MAGNESIUM: Magnesium: 2.5 mg/dL — ABNORMAL HIGH (ref 1.7–2.4)

## 2021-10-21 MED ORDER — METRONIDAZOLE 500 MG/100ML IV SOLN
500.0000 mg | Freq: Two times a day (BID) | INTRAVENOUS | Status: DC
  Administered 2021-10-21 – 2021-10-25 (×8): 500 mg via INTRAVENOUS
  Filled 2021-10-21 (×7): qty 100

## 2021-10-21 MED ORDER — CHLORHEXIDINE GLUCONATE CLOTH 2 % EX PADS
6.0000 | MEDICATED_PAD | Freq: Every day | CUTANEOUS | Status: DC
  Administered 2021-10-22 – 2021-10-26 (×5): 6 via TOPICAL

## 2021-10-21 MED ORDER — LACTATED RINGERS IV SOLN: INTRAVENOUS | Status: AC

## 2021-10-21 MED ORDER — LACTATED RINGERS IV BOLUS
1000.0000 mL | Freq: Once | INTRAVENOUS | Status: AC
  Administered 2021-10-21: 1000 mL via INTRAVENOUS

## 2021-10-21 MED ORDER — SODIUM CHLORIDE 0.9 % IV SOLN
2.0000 g | Freq: Once | INTRAVENOUS | Status: AC
  Administered 2021-10-21: 2 g via INTRAVENOUS
  Filled 2021-10-21: qty 2

## 2021-10-21 MED ORDER — INSULIN ASPART 100 UNIT/ML IJ SOLN
0.0000 [IU] | Freq: Three times a day (TID) | INTRAMUSCULAR | Status: DC
  Administered 2021-10-21: 7 [IU] via SUBCUTANEOUS
  Administered 2021-10-22: 2 [IU] via SUBCUTANEOUS
  Administered 2021-10-22: 5 [IU] via SUBCUTANEOUS
  Administered 2021-10-22: 3 [IU] via SUBCUTANEOUS
  Administered 2021-10-23: 5 [IU] via SUBCUTANEOUS
  Administered 2021-10-23 (×2): 3 [IU] via SUBCUTANEOUS
  Administered 2021-10-24: 5 [IU] via SUBCUTANEOUS
  Administered 2021-10-24 (×2): 3 [IU] via SUBCUTANEOUS
  Administered 2021-10-25: 5 [IU] via SUBCUTANEOUS
  Filled 2021-10-21: qty 0.09

## 2021-10-21 MED ORDER — VANCOMYCIN HCL IN DEXTROSE 1-5 GM/200ML-% IV SOLN: 1000.0000 mg | INTRAVENOUS | Status: DC

## 2021-10-21 MED ORDER — ENOXAPARIN SODIUM 40 MG/0.4ML IJ SOSY
40.0000 mg | PREFILLED_SYRINGE | INTRAMUSCULAR | Status: DC
  Administered 2021-10-22 – 2021-10-23 (×2): 40 mg via SUBCUTANEOUS
  Filled 2021-10-21 (×2): qty 0.4

## 2021-10-21 MED ORDER — SODIUM CHLORIDE 0.9% FLUSH: 10.0000 mL | INTRAVENOUS | Status: DC | PRN

## 2021-10-21 MED ORDER — SODIUM CHLORIDE 0.9% FLUSH
10.0000 mL | Freq: Two times a day (BID) | INTRAVENOUS | Status: DC
  Administered 2021-10-22 – 2021-10-24 (×4): 10 mL
  Administered 2021-10-25: 30 mL
  Administered 2021-10-25: 20 mL

## 2021-10-21 MED ORDER — ONDANSETRON HCL 4 MG PO TABS: 4.0000 mg | ORAL_TABLET | Freq: Four times a day (QID) | ORAL | Status: DC | PRN

## 2021-10-21 MED ORDER — VANCOMYCIN HCL 1750 MG/350ML IV SOLN
1750.0000 mg | Freq: Once | INTRAVENOUS | Status: AC
  Administered 2021-10-21: 1750 mg via INTRAVENOUS
  Filled 2021-10-21: qty 350

## 2021-10-21 MED ORDER — LACTATED RINGERS IV BOLUS (SEPSIS)
1000.0000 mL | Freq: Once | INTRAVENOUS | Status: AC
  Administered 2021-10-21: 1000 mL via INTRAVENOUS

## 2021-10-21 MED ORDER — METRONIDAZOLE 500 MG/100ML IV SOLN
500.0000 mg | Freq: Once | INTRAVENOUS | Status: DC
  Filled 2021-10-21: qty 100

## 2021-10-21 MED ORDER — SODIUM CHLORIDE 0.9 % IV SOLN
2.0000 g | Freq: Two times a day (BID) | INTRAVENOUS | Status: DC
  Administered 2021-10-22 – 2021-10-25 (×7): 2 g via INTRAVENOUS
  Filled 2021-10-21 (×8): qty 2

## 2021-10-21 MED ORDER — ONDANSETRON HCL 4 MG/2ML IJ SOLN
4.0000 mg | Freq: Four times a day (QID) | INTRAMUSCULAR | Status: DC | PRN
  Administered 2021-10-24: 14:00:00 4 mg via INTRAVENOUS
  Filled 2021-10-21: qty 2

## 2021-10-21 MED ORDER — ACETAMINOPHEN 650 MG RE SUPP: 650.0000 mg | Freq: Four times a day (QID) | RECTAL | Status: DC | PRN

## 2021-10-21 MED ORDER — ACETAMINOPHEN 325 MG PO TABS
650.0000 mg | ORAL_TABLET | Freq: Four times a day (QID) | ORAL | Status: DC | PRN
  Administered 2021-10-22 – 2021-10-23 (×3): 650 mg via ORAL
  Filled 2021-10-21 (×4): qty 2

## 2021-10-21 NOTE — H&P (Addendum)
History and Physical    Pamela Kennedy DXA:[id number] DOB: 1956/03/03 DOA: 10/17/2021  PCP: Pcp, No  Patient coming from: Home.  I have personally briefly reviewed patient's old medical records in [place]  Chief Complaint: Altered mental status.  HPI: Pamela Kennedy is a 66 y.o. female with medical history significant of depression, type 2 diabetes, breast cancer, prostate cancer, overactive bladder who was brought to the emergency department with progressively worse altered mental status, decreased appetite, confusion and generalized weakness for the past 2 weeks.  She may have had a fever yesterday.  No cough, emesis, diarrhea or urinary symptoms.  The patient is unable to provide further history at this time.  ED Course: Initial vital signs were temperature 97.9 F, pulse and 12, respiration 18, BP 117/87 mmHg O2 sat 91% on room air.  The patient received 2000 mL of LR bolus.  I added another 1000 mL.  She also received cefepime and vancomycin.  Lab work: Her urinalysis showed glucosuria 50, ketonuria 5 and proteinuria of 100 mg/dL.  Moderate leukocyte esterase.  There were 11-20 RBCs, 21-50 WBC and rare bacteria on microscopic examination.  CBC is her white count 19.4, hemoglobin 12.9 g/dL platelets 969.  Lactic acid was 3.0 mmol/L.  Troponin was normal twice.  Manish is 2.5 mg/dL.  Lipase was 33.  Sodium 127, potassium 4.2, chloride 87 and CO2 24 mmol/L.  Anion gap was 16.  Glucose 341, BUN 32, creatinine 1.82 and calcium 8.9 mg/dL.  Daughter brought this 2.6 g/dL and AST is slightly elevated at 50 units/L.  Imaging: Chest radiograph did not show any active cardiopulmonary disease.  Review of Systems: As per HPI otherwise all other systems reviewed and are negative.  Past Medical History:  Diagnosis Date   Depression    Diabetes mellitus without complication ([place])    Family history of breast cancer    Family history of prostate cancer    Overactive bladder     Past Surgical History:  Procedure Laterality Date   CHOLECYSTECTOMY     Social History  reports that she has been smoking. Her smokeless tobacco use includes snuff. She reports that she does not drink alcohol. No history on file for drug use.  No Known Allergies  Family History  Problem Relation Age of Onset   Prostate cancer Father 24       prostate cancer, metastatic   Breast cancer Sister        breast cancer, dx unknown age   Prostate cancer Brother 45       prostate cancer, dx 50s/60s   Breast cancer Sister 38       breast cancer, dx unknown age   Stroke Paternal Aunt    Prior to Admission medications   Medication Sig Start Date End Date Taking? Authorizing Provider  benztropine (COGENTIN) 0.5 MG tablet Take 0.5 mg by mouth 2 (two) times daily.    [provider]  cephALEXin (KEFLEX) 500 MG capsule Take 1 capsule (500 mg total) by mouth 4 (four) times daily. 10/14/21   Regan Lemming, MD  fesoterodine (TOVIAZ) 8 MG TB24 tablet Take 8 mg by mouth daily. Patient not taking: Reported on 08/21/2021    [provider]  lovastatin (ALTOPREV) 20 MG 24 hr tablet Take 20 mg by mouth at bedtime.    [provider]  megestrol (MEGACE ES) 625 MG/5ML suspension Take 5 mLs (625 mg total) by mouth daily. 10/18/21   Truitt Merle, MD  megestrol (MEGACE) 40  MG/ML suspension Take 15.6 mls by mouth once daily 10/18/21   Truitt Merle, MD  metFORMIN (GLUCOPHAGE) 500 MG tablet Take by mouth 2 (two) times daily with a meal.    [provider]  ondansetron (ZOFRAN-ODT) 4 MG disintegrating tablet Take 1 tablet (4 mg total) by mouth every 8 (eight) hours as needed for nausea or vomiting. 10/14/21   Regan Lemming, MD  prochlorperazine (COMPAZINE) 5 MG tablet Take 1-2 tablets (5-10 mg total) by mouth every 6 (six) hours as needed for nausea or vomiting. 10/18/21   Truitt Merle, MD  raloxifene (EVISTA) 60 MG tablet Take 60 mg by mouth daily.    [provider]  risperidone  (RISPERDAL) 4 MG tablet Take 4 mg by mouth daily. At bedtime    [provider]  traMADol (ULTRAM) 50 MG tablet Take 1 tablet (50 mg total) by mouth every 6 (six) hours as needed. 10/18/21   Truitt Merle, MD  venlafaxine (EFFEXOR) 75 MG tablet Take 75 mg by mouth daily.    [provider]  venlafaxine XR (EFFEXOR-XR) 150 MG 24 hr capsule Take 150 mg by mouth daily with breakfast.    [provider]  XARELTO 20 MG TABS tablet TAKE 1 TABLET BY MOUTH DAILY WITH EVENING MEAL 10/17/21   Alla Feeling, NP    Physical Exam: Vitals:   10/22/2021 1230 10/14/2021 1245 10/11/2021 1316 10/24/2021 1400  BP: (!) 133/91 (!) 138/94 132/84 126/87  Pulse: (!) 105 (!) 103 (!) 103 (!) 103  Resp: 18 17 17 18   Temp:      TempSrc:      SpO2: 96% 97% 97% 96%    Constitutional: Somnolent.  Chronically ill-appearing. Eyes: PERRL, lids and conjunctivae normal.  Bilateral conjunctival injection. ENMT: Mucous membranes are mildly dry.  Posterior pharynx clear of any exudate or lesions. Neck: normal, supple, no masses, no thyromegaly Respiratory: Decreased breath sounds in bases, otherwise clear to auscultation bilaterally, no wheezing, no crackles. Normal respiratory effort. No accessory muscle use.  Cardiovascular: Tachycardic with a regular rhythm in the low 100s, no murmurs / rubs / gallops.  1+ lower extremity pitting edema. 2+ pedal pulses. No carotid bruits.  Abdomen: Distended, obese.  Soft, no tenderness, no masses palpated. No hepatosplenomegaly. Bowel sounds positive.  Musculoskeletal: no clubbing / cyanosis.  Severe generalized weakness.. Good ROM, no contractures. Normal muscle tone.  Skin: no acute rashes, lesions, ulcers on very limited dermatological examination. Neurologic: Grossly nonfocal on limited examination. Psychiatric: Somnolent.  Oriented to name only.  Labs on Admission: I have personally reviewed following labs and imaging studies  CBC: Recent Labs  Lab 10/24/2021 1123   WBC 19.4*  NEUTROABS 17.1*  HGB 12.9  HCT 43.4  MCV 65.4*  PLT 969*    Basic Metabolic Panel: Recent Labs  Lab 10/14/2021 1333  NA 127*  K 4.2  CL 87*  CO2 24  GLUCOSE 341*  BUN 32*  CREATININE 1.82*  CALCIUM 8.8*  MG 2.5*    GFR: Estimated Creatinine Clearance: 35.2 mL/min (A) (by C-G formula based on SCr of 1.82 mg/dL (H)).  Liver Function Tests: Recent Labs  Lab 10/30/2021 1333  AST 50*  ALT 39  ALKPHOS 107  BILITOT 0.9  PROT 7.4  ALBUMIN 2.6*    Urine analysis:    Component Value Date/Time   COLORURINE AMBER (A) 10/23/2021 1450   APPEARANCEUR CLOUDY (A) 10/24/2021 1450   LABSPEC 1.027 10/22/2021 1450   PHURINE 5.0 10/13/2021 1450   GLUCOSEU  50 (A) 10/28/2021 1450   HGBUR NEGATIVE 10/31/2021 1450   BILIRUBINUR NEGATIVE 11/03/2021 1450   KETONESUR 5 (A) 10/08/2021 1450   PROTEINUR 100 (A) 11/05/2021 1450   NITRITE NEGATIVE 11/04/2021 1450   LEUKOCYTESUR MODERATE (A) 11/02/2021 1450    Radiological Exams on Admission: DG Chest Portable 1 View  Result Date: 10/28/2021 CLINICAL DATA:  66 year old female with history of fatigue. EXAM: PORTABLE CHEST 1 VIEW COMPARISON:  No priors. FINDINGS: Lung volumes are low. No consolidative airspace disease. No pleural effusions. No pneumothorax. No pulmonary nodule or mass noted. Pulmonary vasculature and the cardiomediastinal silhouette are within normal limits. IMPRESSION: 1. Low lung volumes without radiographic evidence of acute cardiopulmonary disease. Electronically Signed   By: Vinnie Langton M.D.   On: 10/29/2021 11:43    EKG: Independently reviewed.  Vent. rate 110 BPM PR interval 135 ms QRS duration 94 ms QT/QTcB 343/464 ms P-R-T axes 70 269 4 Sinus tachycardia Consider right atrial enlargement Left anterior fascicular block  Assessment/Plan Principal Problem:   Sepsis due to undetermined organism ([place]) Admit to PCU/inpatient. Continue IV fluids. Continue cefepime per pharmacy. Continue  vancomycin per pharmacy. Follow-up blood culture and sensitivity. Follow-up CBC and CMP in AM.  Active Problems:   AKI (acute kidney injury) ([place]) Continue IV fluids. Avoid hypotension. Avoid nephrotoxicity. Monitor intake and output. Follow-up renal function/electrolytes.    Hyponatremia Corrected to hyperglycemia 133 mmol/L. Continue IV fluids. Follow-up sodium level in AM.    Thrombocytosis In the setting of sepsis. Continue current treatment. Monitor platelet count.    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    Ovarian mass She is scheduled for biopsy Wednesday. This may need to be rescheduled. Overall prognosis is very poor. Consider palliative care evaluation.    Severe protein-calorie malnutrition ([place]) On Megace/off DOAC (high risk for thromboembolism). Protein supplementation as tolerated.    Type 2 diabetes mellitus (HCC) Carbohydrate modified diet. CBG monitoring with RI SS. Hold metformin due to lactic acidosis. Check hemoglobin A1c.   DVT prophylaxis: Lovenox SQ.  Per sister, she is no longer on Xarelto or Eliquis. Code Status:   Full code.  Discussed with her daughter and one of her sisters. Family Communication:  I spoke to 2 of her sisters at bedside.  I spoke to her daughter via phone.  They would like to continue with full treatment at this time for at least a few days since the patient has never spoken of advanced directives with them before. Disposition Plan:   Patient is from:  Home.  Anticipated DC to:  TBD.  Anticipated DC date:  72 to 96 hours.  Anticipated DC barriers: Clinical condition.  Consults called:  TOC team. Admission status:  Inpatient/PCU.  Severity of Illness: High severity due to sepsis due to undetermined organism.  Reubin Milan MD [facility]  How to contact the [facility] Attending or Consulting provider [place] or covering provider during after hours [place], for this  patient?   Check the care team in [facility] and look for a) attending/consulting TR[facility] provider listed and b) the [facility] team listed Log into [email or web address] and use [place]'s universal password to access. If you do not have the password, please contact the hospital operator. Locate the [facility] provider you are looking for under [facility] and page to a number that you can be directly reached. If you still have difficulty reaching the provider, please page the [facility] (Director on Call) for the Hospitalists listed  on amion for assistance.  11/06/2021, 4:07 PM   This document was prepared using Dragon voice recognition software and may contain some unintended transcription errors.

## 2021-10-21 NOTE — Progress Notes (Signed)
Secure chat with Rodman Key.  Aware PICC placement may be tomorrow.  Per flowsheet currently has 2 PIV's.  Madina RN to attempt to get PICC consent.

## 2021-10-21 NOTE — Progress Notes (Signed)
Pt. Arrived with sister to her infusion app. Upon getting her transferred and settled, Ms.Moeser seemed very lethargic & unresponsive. Her sister stated that Ms.Dykman's blood sugar was in the 400's this morning & that EMS was called to her residence. The sister wanted Ms.Arrignton to be seen by one of our doctors. We advised pt.sister to take Ms.Arrignton to ED to be evaluated.

## 2021-10-21 NOTE — ED Triage Notes (Addendum)
Patient BIB sister. Hx ovarian cancer. Decreased PO intake x2 weeks per sister. Worsening AMS x1 week. Not answering questions in triage. No treatments at this time.

## 2021-10-21 NOTE — Progress Notes (Signed)
Peripherally Inserted Central Catheter Placement  The IV Nurse has discussed with the patient and/or persons authorized to consent for the patient, the purpose of this procedure and the potential benefits and risks involved with this procedure.  The benefits include less needle sticks, lab draws from the catheter, and the patient may be discharged home with the catheter. Risks include, but not limited to, infection, bleeding, blood clot (thrombus formation), and puncture of an artery; nerve damage and irregular heartbeat and possibility to perform a PICC exchange if needed/ordered by physician.  Alternatives to this procedure were also discussed.  Bard Power PICC patient education guide, fact sheet on infection prevention and patient information card has been provided to patient /or left at bedside.  Sister at bedside signed consent.  Daughter at bedside upon arrival gave verbal consent.  PICC Placement Documentation  PICC Double Lumen 89/78/47 PICC Right Basilic 33 cm 0 cm (Active)  Indication for Insertion or Continuance of Line Prolonged intravenous therapies;Poor Vasculature-patient has had multiple peripheral attempts or PIVs lasting less than 24 hours;Limited venous access - need for IV therapy >5 days (PICC only) 10/15/2021 1847  Exposed Catheter (cm) 0 cm 11/03/2021 1847  Site Assessment Clean;Dry;Intact 10/10/2021 1847  Lumen #1 Status Flushed;Saline locked;Blood return noted 11/02/2021 1847  Lumen #2 Status Flushed;Saline locked;Blood return noted 10/12/2021 1847  Dressing Type Transparent;Securing device 10/15/2021 1847  Dressing Status Clean;Dry;Intact 10/10/2021 1847  Antimicrobial disc in place? Yes 10/18/2021 1847  Safety Lock Not Applicable 84/12/82 0813  Line Care Connections checked and tightened 11/04/2021 1847  Line Adjustment (NICU/IV Team Only) No 10/31/2021 1847  Dressing Intervention New dressing 10/29/2021 1847  Dressing Change Due 10/28/21 10/14/2021 1847       Pamela Kennedy 11/04/2021, 6:48 PM

## 2021-10-21 NOTE — ED Provider Notes (Signed)
[facility] DEPT Provider Note   CSN: [id number] Arrival date & time: 11/01/2021  1026     History Past Medical History:  Diagnosis Date   Depression    Diabetes mellitus without complication ([place])    Family history of breast cancer    Family history of prostate cancer    Overactive bladder     Chief Complaint  Patient presents with   Altered Mental Status    Pamela Kennedy is a 66 y.o. female.  Patient is a 66 y.o. F with a PMH of gallbladder adenocarcinoma and two recently discovered pelvic masses concerning for possible ovarian cancer vs recurrence coming in with decreased PO intake over the last two weeks and worsening mental status. Patient's sister at bedside says the patient has been having less interest in eating over the last two week and then about a week ago she was more drowsy and less alert than normal so she brought her in. She says the patient felt subjectively warm yesterday but did not take her temperature. She had a UTI a couple weeks ago and she either finished or is still taking her antibiotics, the sister is not sure.    Altered Mental Status Presenting symptoms: confusion   Associated symptoms: fever and weakness       Home Medications Prior to Admission medications   Medication Sig Start Date End Date Taking? Authorizing Provider  benztropine (COGENTIN) 0.5 MG tablet Take 0.5 mg by mouth 2 (two) times daily.    [provider]  cephALEXin (KEFLEX) 500 MG capsule Take 1 capsule (500 mg total) by mouth 4 (four) times daily. 10/14/21   Regan Lemming, MD  fesoterodine (TOVIAZ) 8 MG TB24 tablet Take 8 mg by mouth daily. Patient not taking: Reported on 08/21/2021    [provider]  lovastatin (ALTOPREV) 20 MG 24 hr tablet Take 20 mg by mouth at bedtime.    [provider]  megestrol (MEGACE ES) 625 MG/5ML suspension Take 5 mLs (625 mg total) by mouth daily. 10/18/21   Truitt Merle, MD  megestrol  (MEGACE) 40 MG/ML suspension Take 15.6 mls by mouth once daily 10/18/21   Truitt Merle, MD  metFORMIN (GLUCOPHAGE) 500 MG tablet Take by mouth 2 (two) times daily with a meal.    [provider]  ondansetron (ZOFRAN-ODT) 4 MG disintegrating tablet Take 1 tablet (4 mg total) by mouth every 8 (eight) hours as needed for nausea or vomiting. 10/14/21   Regan Lemming, MD  prochlorperazine (COMPAZINE) 5 MG tablet Take 1-2 tablets (5-10 mg total) by mouth every 6 (six) hours as needed for nausea or vomiting. 10/18/21   Truitt Merle, MD  raloxifene (EVISTA) 60 MG tablet Take 60 mg by mouth daily.    [provider]  risperidone (RISPERDAL) 4 MG tablet Take 4 mg by mouth daily. At bedtime    [provider]  traMADol (ULTRAM) 50 MG tablet Take 1 tablet (50 mg total) by mouth every 6 (six) hours as needed. 10/18/21   Truitt Merle, MD  venlafaxine (EFFEXOR) 75 MG tablet Take 75 mg by mouth daily.    [provider]  venlafaxine XR (EFFEXOR-XR) 150 MG 24 hr capsule Take 150 mg by mouth daily with breakfast.    [provider]  XARELTO 20 MG TABS tablet TAKE 1 TABLET BY MOUTH DAILY WITH EVENING MEAL 10/17/21   Alla Feeling, NP      Allergies    Patient has no known allergies.  Review of Systems   Review of Systems  Constitutional:  Positive for activity change, appetite change and fever.  Neurological:  Positive for weakness.  Psychiatric/Behavioral:  Positive for confusion.    Physical Exam Updated Vital Signs BP 126/87    Pulse (!) 103    Temp 97.9 F (36.6 C) (Axillary)    Resp 18    SpO2 96%  Physical Exam Constitutional:      General: She is sleeping.     Appearance: She is obese. She is ill-appearing.  HENT:     Head: Normocephalic and atraumatic.  Eyes:     Extraocular Movements: Extraocular movements intact.  Cardiovascular:     Rate and Rhythm: Regular rhythm. Tachycardia present.  Pulmonary:     Effort: Pulmonary effort is normal.  Abdominal:      General: There is distension.     Palpations: Abdomen is soft.     Tenderness: There is no abdominal tenderness.  Skin:    General: Skin is cool and dry.  Neurological:     Mental Status: She is lethargic and disoriented.     Motor: Weakness present.    ED Results / Procedures / Treatments   Labs (all labs ordered are listed, but only abnormal results are displayed) Labs Reviewed  CBC WITH DIFFERENTIAL/PLATELET - Abnormal; Notable for the following components:      Result Value   WBC 19.4 (*)    RBC 6.64 (*)    MCV 65.4 (*)    MCH 19.4 (*)    MCHC 29.7 (*)    RDW 17.3 (*)    Platelets 969 (*)    Neutro Abs 17.1 (*)    Monocytes Absolute 1.2 (*)    Abs Immature Granulocytes 0.18 (*)    All other components within normal limits  LACTIC ACID, PLASMA - Abnormal; Notable for the following components:   Lactic Acid, Venous 3.0 (*)    All other components within normal limits  COMPREHENSIVE METABOLIC PANEL - Abnormal; Notable for the following components:   Sodium 127 (*)    Chloride 87 (*)    Glucose, Bld 341 (*)    BUN 32 (*)    Creatinine, Ser 1.82 (*)    Calcium 8.8 (*)    Albumin 2.6 (*)    AST 50 (*)    GFR, Estimated 31 (*)    Anion gap 16 (*)    All other components within normal limits  MAGNESIUM - Abnormal; Notable for the following components:   Magnesium 2.5 (*)    All other components within normal limits  CULTURE, BLOOD (ROUTINE X 2)  CULTURE, BLOOD (ROUTINE X 2)  LIPASE, BLOOD  LACTIC ACID, PLASMA  URINALYSIS, ROUTINE W REFLEX MICROSCOPIC  PATHOLOGIST SMEAR REVIEW  I-STAT CHEM 8, ED  TROPONIN I (HIGH SENSITIVITY)    EKG None  Radiology DG Chest Portable 1 View  Result Date: 10/18/2021 CLINICAL DATA:  66 year old female with history of fatigue. EXAM: PORTABLE CHEST 1 VIEW COMPARISON:  No priors. FINDINGS: Lung volumes are low. No consolidative airspace disease. No pleural effusions. No pneumothorax. No pulmonary nodule or mass noted. Pulmonary  vasculature and the cardiomediastinal silhouette are within normal limits. IMPRESSION: 1. Low lung volumes without radiographic evidence of acute cardiopulmonary disease. Electronically Signed   By: Vinnie Langton M.D.   On: 11/02/2021 11:43    Procedures Procedures    Medications Ordered in ED Medications  lactated ringers infusion ( Intravenous New Bag/Given 10/12/2021 1413)  metroNIDAZOLE (FLAGYL) IVPB  500 mg (has no administration in time range)  vancomycin (VANCOREADY) IVPB 1750 mg/350 mL (1,750 mg Intravenous New Bag/Given 10/20/2021 1413)  lactated ringers bolus 1,000 mL (0 mLs Intravenous Stopped 10/18/2021 1415)  lactated ringers bolus 1,000 mL (1,000 mLs Intravenous New Bag/Given 10/23/2021 1301)  ceFEPIme (MAXIPIME) 2 g in sodium chloride 0.9 % 100 mL IVPB (0 g Intravenous Stopped 10/17/2021 1415)    ED Course/ Medical Decision Making/ A&P                           Medical Decision Making  Patient has had decreased PO intake and worsening mental status over the last several weeks. Labs reviewed by me are remarkable for an increased lactic acid to 3, a worsening leukocytosis to 19.4, and an AKI. Patient's tachycardia has improved with fluids though she will need admission for sepsis rule out and treatment of AKI. Will consult hospitalist for admission.    Final Clinical Impression(s) / ED Diagnoses Final diagnoses:  Altered mental status, unspecified altered mental status type  AKI (acute kidney injury) [facility])    Rx / DC Orders ED Discharge Orders     None         Scarlett Presto, MD 10/30/2021 1435    Lucrezia Starch, MD 10/22/21 [phone]

## 2021-10-21 NOTE — Progress Notes (Signed)
Called for 2nd peripheral IV site placement. Both arms assessed using ultrasound. No suitable veins found for peripheral IV or Midline  placement

## 2021-10-21 NOTE — Progress Notes (Signed)
A consult was received from an ED physician for Cefepime & Vancomycin per pharmacy dosing.  The patient's profile has been reviewed for ht/wt/allergies/indication/available labs.    A one time order has been placed for Cefepime 2gm, Vancomycin 1750mg , Flagyl 500mg  per MD.  Further antibiotics/pharmacy consults should be ordered by admitting physician if indicated.                       Thank you,  Minda Ditto PharmD 10/17/2021  12:44 PM

## 2021-10-21 NOTE — Progress Notes (Signed)
Pharmacy Antibiotic Note  Pamela Kennedy is a 66 y.o. female admitted on 10/08/2021 with sepsis.  Pharmacy has been consulted for Vancomycin & Cefepime dosing, Flagyl per MD   Plan: Cefepime 2gm q12 Vanc 1750mg  x1 in ED, then 1gm q36 for AUC 518, SCr 1.82, Vd 0.5 Flagyl 500mg  IV q12    Temp (24hrs), Avg:97.9 F (36.6 C), Min:97.9 F (36.6 C), Max:97.9 F (36.6 C)  Recent Labs  Lab 11/07/2021 1123 10/09/2021 1333  WBC 19.4*  --   CREATININE  --  1.82*  LATICACIDVEN 3.0*  --     Estimated Creatinine Clearance: 35.2 mL/min (A) (by C-G formula based on SCr of 1.82 mg/dL (H)).    No Known Allergies  Antimicrobials this admission: 1/15 Cefepime >>  1/15 Vancomycin >>  1/15 Flagyl >>  Dose adjustments this admission:  Microbiology results: 1/14 BCx: sent  Thank you for allowing pharmacy to be a part of this patients care.  Minda Ditto PharmD 11/06/2021 3:24 PM

## 2021-10-21 NOTE — ED Notes (Signed)
Dr Roslynn Amble aware lab was not able to obtain the I-stat or the Lactic.

## 2021-10-22 DIAGNOSIS — A419 Sepsis, unspecified organism: Secondary | ICD-10-CM | POA: Diagnosis not present

## 2021-10-22 LAB — CBC WITH DIFFERENTIAL/PLATELET
Abs Immature Granulocytes: 0.14 10*3/uL — ABNORMAL HIGH (ref 0.00–0.07)
Basophils Absolute: 0 10*3/uL (ref 0.0–0.1)
Basophils Relative: 0 %
Eosinophils Absolute: 0 10*3/uL (ref 0.0–0.5)
Eosinophils Relative: 0 %
HCT: 38 % (ref 36.0–46.0)
Hemoglobin: 11.4 g/dL — ABNORMAL LOW (ref 12.0–15.0)
Immature Granulocytes: 1 %
Lymphocytes Relative: 6 %
Lymphs Abs: 1.2 10*3/uL (ref 0.7–4.0)
MCH: 19.6 pg — ABNORMAL LOW (ref 26.0–34.0)
MCHC: 30 g/dL (ref 30.0–36.0)
MCV: 65.4 fL — ABNORMAL LOW (ref 80.0–100.0)
Monocytes Absolute: 1.5 10*3/uL — ABNORMAL HIGH (ref 0.1–1.0)
Monocytes Relative: 7 %
Neutro Abs: 17.4 10*3/uL — ABNORMAL HIGH (ref 1.7–7.7)
Neutrophils Relative %: 86 %
Platelets: 846 10*3/uL — ABNORMAL HIGH (ref 150–400)
RBC: 5.81 MIL/uL — ABNORMAL HIGH (ref 3.87–5.11)
RDW: 16.4 % — ABNORMAL HIGH (ref 11.5–15.5)
Smear Review: UNDETERMINED
WBC: 20.3 10*3/uL — ABNORMAL HIGH (ref 4.0–10.5)
nRBC: 0.1 % (ref 0.0–0.2)

## 2021-10-22 LAB — GLUCOSE, CAPILLARY
Glucose-Capillary: 257 mg/dL — ABNORMAL HIGH (ref 70–99)
Glucose-Capillary: 250 mg/dL — ABNORMAL HIGH (ref 70–99)
Glucose-Capillary: 250 mg/dL — ABNORMAL HIGH (ref 70–99)
Glucose-Capillary: 255 mg/dL — ABNORMAL HIGH (ref 70–99)

## 2021-10-22 LAB — COMPREHENSIVE METABOLIC PANEL
ALT: 32 U/L (ref 0–44)
AST: 42 U/L — ABNORMAL HIGH (ref 15–41)
Albumin: 2.3 g/dL — ABNORMAL LOW (ref 3.5–5.0)
Alkaline Phosphatase: 97 U/L (ref 38–126)
Anion gap: 13 (ref 5–15)
BUN: 34 mg/dL — ABNORMAL HIGH (ref 8–23)
CO2: 24 mmol/L (ref 22–32)
Calcium: 8.6 mg/dL — ABNORMAL LOW (ref 8.9–10.3)
Chloride: 90 mmol/L — ABNORMAL LOW (ref 98–111)
Creatinine, Ser: 1.41 mg/dL — ABNORMAL HIGH (ref 0.44–1.00)
GFR, Estimated: 42 mL/min — ABNORMAL LOW (ref >60)
Glucose, Bld: 271 mg/dL — ABNORMAL HIGH (ref 70–99)
Potassium: 3.8 mmol/L (ref 3.5–5.1)
Sodium: 127 mmol/L — ABNORMAL LOW (ref 135–145)
Total Bilirubin: 0.9 mg/dL (ref 0.3–1.2)
Total Protein: 6.5 g/dL (ref 6.5–8.1)

## 2021-10-22 LAB — AMMONIA: Ammonia: 26 umol/L (ref 9–35)

## 2021-10-22 LAB — HIV ANTIBODY (ROUTINE TESTING W REFLEX): HIV Screen 4th Generation wRfx: NONREACTIVE

## 2021-10-22 MED ORDER — VANCOMYCIN HCL 1250 MG/250ML IV SOLN
1250.0000 mg | INTRAVENOUS | Status: DC
  Administered 2021-10-23: 1250 mg via INTRAVENOUS
  Filled 2021-10-22 (×2): qty 250

## 2021-10-22 MED ORDER — SENNOSIDES-DOCUSATE SODIUM 8.6-50 MG PO TABS
1.0000 | ORAL_TABLET | Freq: Two times a day (BID) | ORAL | Status: DC
  Administered 2021-10-22 – 2021-10-23 (×4): 1 via ORAL
  Filled 2021-10-22 (×4): qty 1

## 2021-10-22 MED ORDER — POLYETHYLENE GLYCOL 3350 17 G PO PACK: 17.0000 g | PACK | Freq: Every day | ORAL | Status: DC

## 2021-10-22 MED ORDER — SODIUM CHLORIDE 0.9 % IV BOLUS
1000.0000 mL | Freq: Once | INTRAVENOUS | Status: AC
  Administered 2021-10-22: 1000 mL via INTRAVENOUS

## 2021-10-22 MED ORDER — POLYETHYLENE GLYCOL 3350 17 G PO PACK
17.0000 g | PACK | Freq: Every day | ORAL | Status: DC
  Administered 2021-10-22 – 2021-10-24 (×3): 17 g via ORAL
  Filled 2021-10-22 (×3): qty 1

## 2021-10-22 NOTE — Progress Notes (Addendum)
PROGRESS NOTE    Pamela Kennedy  DJM:[id number] DOB: [birth year] DOA: 10/10/2021 PCP: Pcp, No   Brief Narrative:  Pamela Kennedy is a 66 y.o. female with medical history significant of depression, type 2 diabetes, , stage 3B gallbladder cancer, overactive bladder who was brought to the emergency department with progressively worse altered mental status, decreased appetite, confusion and generalized weakness for the past 2 weeks. Given concern for mental status changes, sepsis likely secondary to UTI, and profound dehydration patient was admitted for ongoing care.  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 -She is scheduled for biopsy Wednesday at [facility], will discuss with oncology while here if this can be performed as inpatient. -Overall prognosis is very poor. -Consider paracentesis in the next 24 hours pending clinical course volume status  AKI (acute kidney injury) ([place]) -Improving - Continue IV fluids.   Hypovolemic hyponatremia -Discontinue IV fluids.  Worsening abdominal distention -Follow-up sodium level in AM.   Thrombocytosis -Acute phase reactant likely secondary to sepsis.   Severe protein-calorie malnutrition ([place]) -On Megace -Protein supplementation as tolerated   Type 2 diabetes mellitus ([place]), uncontrolled with hyperglycemia -A1C 7.6 -Continue Carbohydrate modified diet -CBG monitoring with SSI -Hold metformin due to lactic acidosis.  DVT prophylaxis: Lovenox SQ - No longer on Xarelto  or Eliquis per family Code Status: Full code Family Communication: At bedside  Status is: Inpt  Dispo: The patient is from: Home              Anticipated d/c is to: Home              Anticipated d/c date is: 48-72h              Patient currently NOT medically stable for discharge  Consultants:  None  Procedures:  None - consider paracentesis  Antimicrobials:  Cefepime, vancomycin, flagyl  Subjective: No acute issues or events overnight mental status improving but not yet back to baseline per family at bedside.  Review of systems markedly limited given patient's mental status  Objective: Vitals:   10/17/2021 2137 10/27/2021 2205 10/22/21 0057 10/22/21 0516  BP: 116/87 116/87 118/86 119/74  Pulse: (!) 106 (!) 106 (!) 107 (!) 109  Resp: 18 18 18 18   Temp: 98.7 F (37.1 C) 98.7 F (37.1 C) 97.6 F (36.4 C) 98.4 F (36.9 C)  TempSrc: Oral Oral Axillary Axillary  SpO2: 97%  96% 95%  Weight:  99.3 kg    Height:  5\' 6"  (1.676 m)      Intake/Output Summary (Last 24 hours) at 10/22/2021 0726 Last data filed at 10/22/2021 0300 Gross per 24 hour  Intake 4694.33 ml  Output --  Net 4694.33 ml   Filed Weights   11/05/2021 2205  Weight: 99.3 kg    Examination:  General:  Pleasantly resting in bed, No acute distress. HEENT:  Normocephalic atraumatic.  Sclerae nonicteric, noninjected.  Extraocular movements intact bilaterally. Neck:  Without mass or deformity.  Trachea is midline. Lungs:  Clear to auscultate bilaterally without rhonchi, wheeze, or rales. Heart:  Regular rate and rhythm.  Without murmurs, rubs, or gallops. Abdomen: Distended, moderately tender without tympany Extremities:  Without cyanosis, clubbing, edema, or obvious deformity. Vascular:  Dorsalis pedis and posterior tibial pulses palpable bilaterally. Skin:  Warm and dry, no erythema, no ulcerations.   Data Reviewed: I have personally reviewed following labs and imaging studies  CBC: Recent Labs  Lab  10/31/2021 1123 10/22/21 0316  WBC 19.4* 20.3*  NEUTROABS 17.1* 17.4*  HGB 12.9 11.4*  HCT 43.4 38.0  MCV 65.4* 65.4*  PLT 969* 035*   Basic Metabolic Panel: Recent Labs  Lab 10/20/2021 1333 10/22/21 0316  NA 127* 127*  K 4.2 3.8  CL 87* 90*  CO2 24 24  GLUCOSE 341* 271*  BUN 32* 34*  CREATININE 1.82* 1.41*  CALCIUM 8.8* 8.6*  MG 2.5*  --    GFR: Estimated Creatinine Clearance: 47.9 mL/min (A) (by C-G formula based on SCr of 1.41 mg/dL (H)). Liver Function Tests: Recent Labs  Lab 10/28/2021 1333 10/22/21 0316  AST 50* 42*  ALT 39 32  ALKPHOS 107 97  BILITOT 0.9 0.9  PROT 7.4 6.5  ALBUMIN 2.6* 2.3*   Recent Labs  Lab 10/31/2021 1333  LIPASE 33   No results for input(s): AMMONIA in the last 168 hours. Coagulation Profile: No results for input(s): INR, PROTIME in the last 168 hours. Cardiac Enzymes: No results for input(s): CKTOTAL, CKMB, CKMBINDEX, TROPONINI in the last 168 hours. BNP (last 3 results) No results for input(s): PROBNP in the last 8760 hours. HbA1C: Recent Labs    10/23/2021 1550  HGBA1C 7.6*   CBG: Recent Labs  Lab 10/30/2021 1914 10/28/2021 2046  GLUCAP 317* 292*   Lipid Profile: No results for input(s): CHOL, HDL, LDLCALC, TRIG, CHOLHDL, LDLDIRECT in the last 72 hours. Thyroid Function Tests: No results for input(s): TSH, T4TOTAL, FREET4, T3FREE, THYROIDAB in the last 72 hours. Anemia Panel: No results for input(s): VITAMINB12, FOLATE, FERRITIN, TIBC, IRON, RETICCTPCT in the last 72 hours. Sepsis Labs: Recent Labs  Lab 10/31/2021 1123 10/09/2021 2254  LATICACIDVEN 3.0* 1.8    Recent Results (from the past 240 hour(s))  Resp Panel by RT-PCR (Flu A&B, Covid) Nasopharyngeal Swab     Status: None   Collection Time: 10/13/21  9:25 PM   Specimen: Nasopharyngeal Swab; Nasopharyngeal(NP) swabs in vial transport medium  Result Value Ref Range Status   SARS Coronavirus 2 by RT PCR NEGATIVE NEGATIVE Final    Comment: (NOTE) SARS-CoV-2 target  nucleic acids are NOT DETECTED.  The SARS-CoV-2 RNA is generally detectable in upper respiratory specimens during the acute phase of infection. The lowest concentration of SARS-CoV-2 viral copies this assay can detect is 138 copies/mL. A negative result does not preclude SARS-Cov-2 infection and should not be used as the sole basis for treatment or other patient management decisions. A negative result may occur with  improper specimen collection/handling, submission of specimen other than nasopharyngeal swab, presence of viral mutation(s) within the areas targeted by this assay, and inadequate number of viral copies(<138 copies/mL). A negative result must be combined with clinical observations, patient history, and epidemiological information. The expected result is Negative.  Fact Sheet for Patients:  EntrepreneurPulse.com.au  Fact Sheet for Healthcare Providers:  IncredibleEmployment.be  This test is no t yet approved or cleared by the [place] FDA and  has been authorized for detection and/or diagnosis of SARS-CoV-2 by FDA under an Emergency Use Authorization (EUA). This EUA will remain  in effect (meaning this test can be used) for the duration of the COVID-19 declaration under Section 564(b)(1) of the Act, 21 U.S.C.section 360bbb-3(b)(1), unless  the authorization is terminated  or revoked sooner.       Influenza A by PCR NEGATIVE NEGATIVE Final   Influenza B by PCR NEGATIVE NEGATIVE Final    Comment: (NOTE) The Xpert Xpress SARS-CoV-2/FLU/RSV plus assay is intended as an aid in the diagnosis of influenza from Nasopharyngeal swab specimens and should not be used as a sole basis for treatment. Nasal washings and aspirates are unacceptable for Xpert Xpress SARS-CoV-2/FLU/RSV testing.  Fact Sheet for Patients: EntrepreneurPulse.com.au  Fact Sheet for Healthcare  Providers: IncredibleEmployment.be  This test is not yet approved or cleared by the [place] FDA and has been authorized for detection and/or diagnosis of SARS-CoV-2 by FDA under an Emergency Use Authorization (EUA). This EUA will remain in effect (meaning this test can be used) for the duration of the COVID-19 declaration under Section 564(b)(1) of the Act, 21 U.S.C. section 360bbb-3(b)(1), unless the authorization is terminated or revoked.  Performed at [facility], [place] [address], [place], [place] [address]          Radiology Studies: DG Chest Portable 1 View  Result Date: 10/28/2021 CLINICAL DATA:  66 year old female with history of fatigue. EXAM: PORTABLE CHEST 1 VIEW COMPARISON:  No priors. FINDINGS: Lung volumes are low. No consolidative airspace disease. No pleural effusions. No pneumothorax. No pulmonary nodule or mass noted. Pulmonary vasculature and the cardiomediastinal silhouette are within normal limits. IMPRESSION: 1. Low lung volumes without radiographic evidence of acute cardiopulmonary disease. Electronically Signed   By: Vinnie Langton M.D.   On: 10/20/2021 11:43   [place] EKG SITE RITE  Result Date: 10/12/2021 If Site Rite image not attached, placement could not be confirmed due to current cardiac rhythm.       Scheduled Meds:  Chlorhexidine Gluconate Cloth  6 each Topical Daily   enoxaparin (LOVENOX) injection  40 mg Subcutaneous Q24H   insulin aspart  0-9 Units Subcutaneous TID WC   sodium chloride flush  10-40 mL Intracatheter Q12H   Continuous Infusions:  ceFEPime (MAXIPIME) IV 2 g (10/22/21 0048)   lactated ringers 150 mL/hr at 10/24/2021 2303   metronidazole 500 mg (10/22/21 0513)   [START ON 10/23/2021] vancomycin       LOS: 1 day   Time spent: 2min  Kahmari Koller C Haide Klinker, DO [facility]  If 7PM-7AM, please contact night-coverage [email or web address]  10/22/2021, 7:26 AM

## 2021-10-22 NOTE — ED Provider Notes (Signed)
Ultrasound ED Peripheral IV (Provider)  Date/Time: 10/22/2021 9:31 AM Performed by: Lucrezia Starch, MD Authorized by: Lucrezia Starch, MD   Procedure details:    Indications: hydration and multiple failed IV attempts     Location:  Right AC   Angiocath:  20 G   Bedside Ultrasound Guided: Yes     Images: not archived     Patient tolerated procedure without complications: Yes     Dressing applied: Yes   Comments:         .Critical Care Performed by: Lucrezia Starch, MD Authorized by: Lucrezia Starch, MD   Critical care provider statement:    Critical care time (minutes):  37   Critical care was necessary to treat or prevent imminent or life-threatening deterioration of the following conditions:  Sepsis and renal failure   Critical care was time spent personally by me on the following activities:  Development of treatment plan with patient or surrogate, discussions with consultants, evaluation of patient's response to treatment, examination of patient, ordering and review of laboratory studies, ordering and review of radiographic studies, ordering and performing treatments and interventions, pulse oximetry, re-evaluation of patient's condition and review of old charts    Lucrezia Starch, MD 10/22/21 [phone]

## 2021-10-22 NOTE — Progress Notes (Signed)
Pharmacy Antibiotic Note  Pamela Kennedy is a 66 y.o. female admitted on 10/17/2021 with sepsis.  Pharmacy has been consulted for Vancomycin & Cefepime dosing, Flagyl per MD   Plan: Cefepime 2gm q12 Vanc 1250 mg IV q36h (Est AUC 467 using SCr 1.4) Flagyl 500mg  IV q12 Follow up renal function, culture results, and clinical course.      Height: 5\' 6"  (167.6 cm) Weight: 99.3 kg (218 lb 14.4 oz) IBW/kg (Calculated) : 59.3  Temp (24hrs), Avg:98.2 F (36.8 C), Min:97.6 F (36.4 C), Max:98.7 F (37.1 C)  Recent Labs  Lab 11/05/2021 1123 10/24/2021 1333 11/02/2021 2254 10/22/21 0316  WBC 19.4*  --   --  20.3*  CREATININE  --  1.82*  --  1.41*  LATICACIDVEN 3.0*  --  1.8  --      Estimated Creatinine Clearance: 47.9 mL/min (A) (by C-G formula based on SCr of 1.41 mg/dL (H)).    No Known Allergies  Antimicrobials this admission: 1/15 Cefepime >>  1/15 Vancomycin >>  1/15 Flagyl >>  Dose adjustments this admission:  Microbiology results: 1/14 BCx: sent  Thank you for allowing pharmacy to be a part of this patients care.  Gretta Arab PharmD, BCPS Clinical Pharmacist [facility] main pharmacy [phone] 10/22/2021 11:03 AM

## 2021-10-23 ENCOUNTER — Inpatient Hospital Stay (HOSPITAL_COMMUNITY): Payer: Medicare Other

## 2021-10-23 ENCOUNTER — Other Ambulatory Visit (HOSPITAL_COMMUNITY): Payer: Self-pay

## 2021-10-23 DIAGNOSIS — A419 Sepsis, unspecified organism: Secondary | ICD-10-CM | POA: Diagnosis not present

## 2021-10-23 LAB — GLUCOSE, CAPILLARY
Glucose-Capillary: 241 mg/dL — ABNORMAL HIGH (ref 70–99)
Glucose-Capillary: 228 mg/dL — ABNORMAL HIGH (ref 70–99)
Glucose-Capillary: 264 mg/dL — ABNORMAL HIGH (ref 70–99)
Glucose-Capillary: 234 mg/dL — ABNORMAL HIGH (ref 70–99)

## 2021-10-23 LAB — PROTEIN, PLEURAL OR PERITONEAL FLUID: Total protein, fluid: 4.6 g/dL

## 2021-10-23 LAB — CBC
HCT: 38.6 % (ref 36.0–46.0)
Hemoglobin: 11.8 g/dL — ABNORMAL LOW (ref 12.0–15.0)
MCH: 19.8 pg — ABNORMAL LOW (ref 26.0–34.0)
MCHC: 30.6 g/dL (ref 30.0–36.0)
MCV: 64.7 fL — ABNORMAL LOW (ref 80.0–100.0)
Platelets: 798 10*3/uL — ABNORMAL HIGH (ref 150–400)
RBC: 5.97 MIL/uL — ABNORMAL HIGH (ref 3.87–5.11)
RDW: 17 % — ABNORMAL HIGH (ref 11.5–15.5)
WBC: 22.4 10*3/uL — ABNORMAL HIGH (ref 4.0–10.5)
nRBC: 0.1 % (ref 0.0–0.2)

## 2021-10-23 LAB — COMPREHENSIVE METABOLIC PANEL
ALT: 35 U/L (ref 0–44)
AST: 44 U/L — ABNORMAL HIGH (ref 15–41)
Albumin: 2.2 g/dL — ABNORMAL LOW (ref 3.5–5.0)
Alkaline Phosphatase: 103 U/L (ref 38–126)
Anion gap: 15 (ref 5–15)
BUN: 39 mg/dL — ABNORMAL HIGH (ref 8–23)
CO2: 21 mmol/L — ABNORMAL LOW (ref 22–32)
Calcium: 8.6 mg/dL — ABNORMAL LOW (ref 8.9–10.3)
Chloride: 91 mmol/L — ABNORMAL LOW (ref 98–111)
Creatinine, Ser: 1.56 mg/dL — ABNORMAL HIGH (ref 0.44–1.00)
GFR, Estimated: 37 mL/min — ABNORMAL LOW (ref >60)
Glucose, Bld: 259 mg/dL — ABNORMAL HIGH (ref 70–99)
Potassium: 3.8 mmol/L (ref 3.5–5.1)
Sodium: 127 mmol/L — ABNORMAL LOW (ref 135–145)
Total Bilirubin: 1 mg/dL (ref 0.3–1.2)
Total Protein: 6.5 g/dL (ref 6.5–8.1)

## 2021-10-23 LAB — BODY FLUID CELL COUNT WITH DIFFERENTIAL
Eos, Fluid: 0 %
Lymphs, Fluid: 9 %
Monocyte-Macrophage-Serous Fluid: 16 % — ABNORMAL LOW (ref 50–90)
Neutrophil Count, Fluid: 75 % — ABNORMAL HIGH (ref 0–25)
Total Nucleated Cell Count, Fluid: 1167 cu mm — ABNORMAL HIGH (ref 0–1000)

## 2021-10-23 LAB — LACTATE DEHYDROGENASE, PLEURAL OR PERITONEAL FLUID: LD, Fluid: 1217 U/L — ABNORMAL HIGH (ref 3–23)

## 2021-10-23 LAB — LACTATE DEHYDROGENASE: LDH: 374 U/L — ABNORMAL HIGH (ref 98–192)

## 2021-10-23 LAB — PATHOLOGIST SMEAR REVIEW

## 2021-10-23 LAB — ALBUMIN: Albumin: 1.8 g/dL — ABNORMAL LOW (ref 3.5–5.0)

## 2021-10-23 MED ORDER — FUROSEMIDE 10 MG/ML IJ SOLN
40.0000 mg | Freq: Two times a day (BID) | INTRAMUSCULAR | Status: AC
  Administered 2021-10-23 – 2021-10-24 (×4): 40 mg via INTRAVENOUS
  Filled 2021-10-23 (×4): qty 4

## 2021-10-23 MED ORDER — TRAMADOL HCL 50 MG PO TABS
50.0000 mg | ORAL_TABLET | Freq: Once | ORAL | Status: AC
  Administered 2021-10-23: 50 mg via ORAL
  Filled 2021-10-23: qty 1

## 2021-10-23 MED ORDER — ORAL CARE MOUTH RINSE
15.0000 mL | Freq: Two times a day (BID) | OROMUCOSAL | Status: DC
  Administered 2021-10-23 – 2021-10-25 (×5): 15 mL via OROMUCOSAL

## 2021-10-23 MED ORDER — LIDOCAINE HCL 1 % IJ SOLN
INTRAMUSCULAR | Status: AC
  Administered 2021-10-23: 10 mL
  Filled 2021-10-23: qty 20

## 2021-10-23 NOTE — TOC CM/SW Note (Signed)
°  Transition of Care [facility]) Screening Note   Patient Details  Name: Cataleia Gade Date of Birth: 1956-07-14   Transition of Care [facility]) CM/SW Contact:    Ross Ludwig, LCSW Phone Number: 10/23/2021, 6:25 PM    Transition of Care Department [facility]) has reviewed patient and no TOC needs have been identified at this time. We will continue to monitor patient advancement through interdisciplinary progression rounds. If new patient transition needs arise, please place a TOC consult.

## 2021-10-23 NOTE — Progress Notes (Signed)
PROGRESS NOTE    Pamela Kennedy  CBJ:[id number] DOB: 10/29/1955 DOA: 10/30/2021 PCP: Pcp, No   Brief Narrative:  Pamela Kennedy is a 66 y.o. female with medical history significant of depression, type 2 diabetes, , stage 3B gallbladder cancer, overactive bladder, history of blood clots still taking xarelto (per daughter) - who was brought to the emergency department with progressively worse altered mental status, decreased appetite, confusion and generalized weakness for the past 2 weeks. Given concern for mental status changes, sepsis likely secondary to UTI, and profound dehydration patient was admitted for ongoing care.  Assessment & Plan:  GOALS OF CARE - Lengthy discussion at bedside today with sisters about patient's worsening status -Repeat discussion with daughter over the phone who is, alongside her brother, healthcare power of attorney -we discussed patient's CODE STATUS and she and her brother agree that DNR is most appropriate at this time given her worsening status and previous discussions. - Patient is more somnolent today poorly arousable, this is a drastic change as yesterday she was awake alert and at least able to feed herself. - We discussed she has an unknown pelvic mass with a known history of gallbladder adenocarcinoma concerning for metastatic disease especially in the setting of new ascites.  Sepsis secondary to presumed UTI, cannot rule out concurrent/secondary infections -rule out SBP, likely POA - Continue cefepime, vancomycin, Flagyl per pharmacy - Follows cultures -paracentesis pending, will follow gram stain and cultures as well    Acute metabolic encephalopathy, multifactorial, POA Rule out secondary causes - In the setting of sepsis, dehydration, AKI - Ammonia WNL - Continue supportive care  Primary gall bladder adenocarcinoma (HCC) Unspecified ovarian mass concerning for metastatic disease Profound abdominal ascites - Follow-up with oncology  as scheduled. - She is scheduled for biopsy Wednesday at [facility], oncology is attempting to discuss case with IR to see if biopsy is reasonable while admitted here - Overall prognosis is very poor. - Paracentesis ordered, follow cytology gram stain and cultures  AKI (acute kidney injury) ([place]) - Minimally improving, patient has worsening ascites and bilateral lower extremity edema today, Lasix IV twice daily x4 doses follow clinically. - Discussed with family limited options for treating her AKI while being edematous and clearly volume overloaded -no indication for dialysis despite our discussion today   Hypovolemic hyponatremia - Discontinue IV fluids. Worsening abdominal distention, lower extremity edema - Follow-up sodium level in AM.   Thrombocytosis - Acute phase reactant likely secondary to sepsis.   Severe protein-calorie malnutrition ([place]) - On Megace - Protein supplementation as tolerated   Type 2 diabetes mellitus ([place]), uncontrolled with hyperglycemia - A1C 7.6 - Continue Carbohydrate modified diet - CBG monitoring with SSI - Hold metformin due to lactic acidosis.  DVT prophylaxis: Lovenox SQ - Daughter states patient IS still on xarelto** Code Status: DNR Family Communication: At bedside  Status is: Inpt  Dispo: The patient is from: Home              Anticipated d/c is to: TBD              Anticipated d/c date is: 48-72h              Patient currently NOT medically stable for discharge  Consultants:  None  Procedures:  None - consider paracentesis  Antimicrobials:   - Cefepime, vancomycin, flagyl  Subjective: Worsening mental status, poorly interactive this morning unable to orient or follow simple commands or interact in any meaningful way.  Family at bedside indicates  she had been worsening since yesterday evening.  Review of systems limited given above.  Objective: Vitals:   10/22/21 1255 10/22/21 1323 10/22/21 2040 10/23/21 0500  BP: 95/64 116/84 113/78  121/84  Pulse: (!) 107 (!) 108 (!) 109 (!) 108  Resp: 19 20 20 20   Temp: 97.7 F (36.5 C) 97.7 F (36.5 C) 97.7 F (36.5 C) 98.4 F (36.9 C)  TempSrc: Oral Oral Oral Oral  SpO2: 96% 96% 96% 96%  Weight:      Height:        Intake/Output Summary (Last 24 hours) at 10/23/2021 0721 Last data filed at 10/23/2021 0500 Gross per 24 hour  Intake 2045.83 ml  Output 400 ml  Net 1645.83 ml    Filed Weights   10/13/2021 2205  Weight: 99.3 kg    Examination:  General: Somnolent, poorly arousable, noninteractive but protecting airway HEENT:  Normocephalic atraumatic.  Sclerae nonicteric, noninjected.  Extraocular movements intact bilaterally. Neck:  Without mass or deformity.  Trachea is midline. Lungs:  Clear to auscultate bilaterally without rhonchi, wheeze, or rales. Heart:  Regular rate and rhythm.  Without murmurs, rubs, or gallops. Abdomen: Distended, tympanic, without overt rebound or tenderness given depressed mental status Extremities: Without cyanosis, clubbing, obvious deformity.  3+ pitting edema bilateral lower extremities to the knee Vascular:  Dorsalis pedis and posterior tibial pulses palpable bilaterally. Skin:  Warm and dry, no erythema   Data Reviewed: I have personally reviewed following labs and imaging studies  CBC: Recent Labs  Lab 10/08/2021 1123 10/22/21 0316 10/23/21 0316  WBC 19.4* 20.3* 22.4*  NEUTROABS 17.1* 17.4*  --   HGB 12.9 11.4* 11.8*  HCT 43.4 38.0 38.6  MCV 65.4* 65.4* 64.7*  PLT 969* 846* 798*    Basic Metabolic Panel: Recent Labs  Lab 10/25/2021 1333 10/22/21 0316 10/23/21 0316  NA 127* 127* 127*  K 4.2 3.8 3.8  CL 87* 90* 91*  CO2 24 24 21*  GLUCOSE 341* 271* 259*  BUN 32* 34* 39*  CREATININE 1.82* 1.41* 1.56*  CALCIUM 8.8* 8.6* 8.6*  MG 2.5*  --   --     GFR: Estimated Creatinine Clearance: 43.3 mL/min (A) (by C-G formula based on SCr of 1.56 mg/dL (H)). Liver Function Tests: Recent Labs  Lab 10/10/2021 1333  10/22/21 0316 10/23/21 0316  AST 50* 42* 44*  ALT 39 32 35  ALKPHOS 107 97 103  BILITOT 0.9 0.9 1.0  PROT 7.4 6.5 6.5  ALBUMIN 2.6* 2.3* 2.2*    Recent Labs  Lab 10/08/2021 1333  LIPASE 33    Recent Labs  Lab 10/22/21 1230  AMMONIA 26   Coagulation Profile: No results for input(s): INR, PROTIME in the last 168 hours. Cardiac Enzymes: No results for input(s): CKTOTAL, CKMB, CKMBINDEX, TROPONINI in the last 168 hours. BNP (last 3 results) No results for input(s): PROBNP in the last 8760 hours. HbA1C: Recent Labs    10/15/2021 1550  HGBA1C 7.6*    CBG: Recent Labs  Lab 11/05/2021 2046 10/22/21 0726 10/22/21 1110 10/22/21 1700 10/22/21 2214  GLUCAP 292* 257* 250* 250* 255*    Lipid Profile: No results for input(s): CHOL, HDL, LDLCALC, TRIG, CHOLHDL, LDLDIRECT in the last 72 hours. Thyroid Function Tests: No results for input(s): TSH, T4TOTAL, FREET4, T3FREE, THYROIDAB in the last 72 hours. Anemia Panel: No results for input(s): VITAMINB12, FOLATE, FERRITIN, TIBC, IRON, RETICCTPCT in the last 72 hours. Sepsis Labs: Recent Labs  Lab 10/24/2021 1123 11/01/2021 2254  LATICACIDVEN 3.0* 1.8  Recent Results (from the past 240 hour(s))  Resp Panel by RT-PCR (Flu A&B, Covid) Nasopharyngeal Swab     Status: None   Collection Time: 10/13/21  9:25 PM   Specimen: Nasopharyngeal Swab; Nasopharyngeal(NP) swabs in vial transport medium  Result Value Ref Range Status   SARS Coronavirus 2 by RT PCR NEGATIVE NEGATIVE Final    Comment: (NOTE) SARS-CoV-2 target nucleic acids are NOT DETECTED.  The SARS-CoV-2 RNA is generally detectable in upper respiratory specimens during the acute phase of infection. The lowest concentration of SARS-CoV-2 viral copies this assay can detect is 138 copies/mL. A negative result does not preclude SARS-Cov-2 infection and should not be used as the sole basis for treatment or other patient management decisions. A negative result may occur with   improper specimen collection/handling, submission of specimen other than nasopharyngeal swab, presence of viral mutation(s) within the areas targeted by this assay, and inadequate number of viral copies(<138 copies/mL). A negative result must be combined with clinical observations, patient history, and epidemiological information. The expected result is Negative.  Fact Sheet for Patients:  EntrepreneurPulse.com.au  Fact Sheet for Healthcare Providers:  IncredibleEmployment.be  This test is no t yet approved or cleared by the [place] FDA and  has been authorized for detection and/or diagnosis of SARS-CoV-2 by FDA under an Emergency Use Authorization (EUA). This EUA will remain  in effect (meaning this test can be used) for the duration of the COVID-19 declaration under Section 564(b)(1) of the Act, 21 U.S.C.section 360bbb-3(b)(1), unless the authorization is terminated  or revoked sooner.       Influenza A by PCR NEGATIVE NEGATIVE Final   Influenza B by PCR NEGATIVE NEGATIVE Final    Comment: (NOTE) The Xpert Xpress SARS-CoV-2/FLU/RSV plus assay is intended as an aid in the diagnosis of influenza from Nasopharyngeal swab specimens and should not be used as a sole basis for treatment. Nasal washings and aspirates are unacceptable for Xpert Xpress SARS-CoV-2/FLU/RSV testing.  Fact Sheet for Patients: EntrepreneurPulse.com.au  Fact Sheet for Healthcare Providers: IncredibleEmployment.be  This test is not yet approved or cleared by the [place] FDA and has been authorized for detection and/or diagnosis of SARS-CoV-2 by FDA under an Emergency Use Authorization (EUA). This EUA will remain in effect (meaning this test can be used) for the duration of the COVID-19 declaration under Section 564(b)(1) of the Act, 21 U.S.C. section 360bbb-3(b)(1), unless the authorization is terminated  or revoked.  Performed at [facility], [place] [address] [place], [place] [address]   Blood culture (routine x 2)     Status: None (Preliminary result)   Collection Time: 10/09/2021 11:23 AM   Specimen: BLOOD  Result Value Ref Range Status   Specimen Description   Final    BLOOD BLOOD LEFT HAND Performed at [facility] [address] [place], [place] [address]    Special Requests   Final    BOTTLES DRAWN AEROBIC AND ANAEROBIC Blood Culture adequate volume Performed at [facility] [address] [place], [place] [address]    Culture   Final    NO GROWTH < 24 HOURS Performed at [facility] [address], [place], [place] [address]    Report Status PENDING  Incomplete  Blood culture (routine x 2)     Status: None (Preliminary result)   Collection Time: 10/12/2021 11:28 AM   Specimen: BLOOD  Result Value Ref Range Status   Specimen Description   Final  BLOOD LEFT ANTECUBITAL Performed at [facility] [address] [place], [place] [address]    Special Requests   Final    BOTTLES DRAWN AEROBIC AND ANAEROBIC Blood Culture results may not be optimal due to an inadequate volume of blood received in culture bottles Performed at [facility] [address], [place], [place] [address]    Culture   Final    NO GROWTH < 24 HOURS Performed at [facility] [address] [place],  [address]    Report Status PENDING  Incomplete          Radiology Studies: DG Chest Portable 1 View  Result Date: 10/11/2021 CLINICAL DATA:  66 year old female with history of fatigue. EXAM: PORTABLE CHEST 1 VIEW COMPARISON:  No priors. FINDINGS: Lung volumes are low. No consolidative airspace disease. No pleural effusions. No pneumothorax. No pulmonary nodule or mass noted. Pulmonary vasculature and the cardiomediastinal silhouette are within normal limits. IMPRESSION: 1. Low lung volumes  without radiographic evidence of acute cardiopulmonary disease. Electronically Signed   By: Vinnie Langton M.D.   On: 10/20/2021 11:43   [place] EKG SITE RITE  Result Date: 10/12/2021 If Site Rite image not attached, placement could not be confirmed due to current cardiac rhythm.       Scheduled Meds:  Chlorhexidine Gluconate Cloth  6 each Topical Daily   enoxaparin (LOVENOX) injection  40 mg Subcutaneous Q24H   insulin aspart  0-9 Units Subcutaneous TID WC   polyethylene glycol  17 g Oral Daily   senna-docusate  1 tablet Oral BID   sodium chloride flush  10-40 mL Intracatheter Q12H   Continuous Infusions:  ceFEPime (MAXIPIME) IV 2 g (10/23/21 0038)   metronidazole 500 mg (10/23/21 0430)   vancomycin 1,250 mg (10/23/21 0132)     LOS: 2 days   Time spent: 70min  Achol Azpeitia C Sanaii Caporaso, DO [facility]  If 7PM-7AM, please contact night-coverage [email or web address]  10/23/2021, 7:21 AM

## 2021-10-23 NOTE — Procedures (Signed)
PROCEDURE SUMMARY:  Successful US guided paracentesis from RLQ.  Yielded 1.3L of yellow fluid.  No immediate complications.  Pt tolerated well.   Specimen was sent for labs.  EBL < 34mL  Jessica Checketts PA-C 10/23/2021 3:06 PM

## 2021-10-23 NOTE — Progress Notes (Signed)
Inpatient Diabetes Program Recommendations  AACE/ADA: New Consensus Statement on Inpatient Glycemic Control (2015)  Target Ranges:  Prepandial:   less than 140 mg/dL      Peak postprandial:   less than 180 mg/dL (1-2 hours)      Critically ill patients:  140 - 180 mg/dL   Lab Results  Component Value Date   GLUCAP 228 (H) 10/23/2021   HGBA1C 7.6 (H) 11/03/2021    Review of Glycemic Control  Diabetes history: DM2 Outpatient Diabetes medications: metformin 500 mg BID Current orders for Inpatient glycemic control: Novolog 0-9 units TID with meals  HgbA1C - 7.6% Blood sugars above goal of 140-180 mg/dL. FBS > 180 x 2 days. May benefit from addition of basal insulin.  Inpatient Diabetes Program Recommendations:    Consider adding Semglee 8 units QHS (If appropriate)  Continue to follow.  Thank you. Lorenda Peck, RD, LDN, CDE Inpatient Diabetes Coordinator [phone]

## 2021-10-23 NOTE — Progress Notes (Signed)
Pamela Kennedy   DOB:1956/03/25   KD#:[id number]   KDX#:[id number]  Oncology follow up note   Subjective: Patient is well-known to me, under my care for her gallbladder cancer.  She was recently found to have a large right ovarian mass, pending biopsy.  She presented to the hospital two days ago for altered mental status, and worsening abdominal pain and distention.  She underwent paracentesis today.  She was very drowsy after procedure when I saw her, did not answer any of my questions, I spoke with her sister.   Objective:  Vitals:   10/23/21 0500 10/23/21 1531  BP: 121/84 114/79  Pulse: (!) 108 (!) 105  Resp: 20 18  Temp: 98.4 F (36.9 C) 98 F (36.7 C)  SpO2: 96% 100%    Body mass index is 35.33 kg/m.  Intake/Output Summary (Last 24 hours) at 10/23/2021 2105 Last data filed at 10/23/2021 0800 Gross per 24 hour  Intake 658.37 ml  Output 250 ml  Net 408.37 ml     Sclerae unicteric  Oropharynx clear  No peripheral adenopathy  Lungs clear -- no rales or rhonchi  Heart regular rate and rhythm  Abdomen distended, with diffuse tenderness  MSK no focal spinal tenderness, no peripheral edema    CBG (last 3)  Recent Labs    10/23/21 0728 10/23/21 1209 10/23/21 1655  GLUCAP 241* 228* 264*     Labs:  Urine Studies No results for input(s): UHGB, CRYS in the last 72 hours.  Invalid input(s): UACOL, UAPR, USPG, UPH, UTP, UGL, UKET, UBIL, UNIT, UROB, ULEU, UEPI, UWBC, Junie Panning [place], [place], [place]  Basic Metabolic Panel: Recent Labs  Lab 10/16/2021 1333 10/22/21 0316 10/23/21 0316  NA 127* 127* 127*  K 4.2 3.8 3.8  CL 87* 90* 91*  CO2 24 24 21*  GLUCOSE 341* 271* 259*  BUN 32* 34* 39*  CREATININE 1.82* 1.41* 1.56*  CALCIUM 8.8* 8.6* 8.6*  MG 2.5*  --   --    GFR Estimated Creatinine Clearance: 43.3 mL/min (A) (by C-G formula based on SCr of 1.56 mg/dL (H)). Liver Function Tests: Recent Labs  Lab 10/19/2021 1333 10/22/21 0316 10/23/21 0316 10/23/21 1254   AST 50* 42* 44*  --   ALT 39 32 35  --   ALKPHOS 107 97 103  --   BILITOT 0.9 0.9 1.0  --   PROT 7.4 6.5 6.5  --   ALBUMIN 2.6* 2.3* 2.2* 1.8*   Recent Labs  Lab 11/05/2021 1333  LIPASE 33   Recent Labs  Lab 10/22/21 1230  AMMONIA 26   Coagulation profile No results for input(s): INR, PROTIME in the last 168 hours.  CBC: Recent Labs  Lab 11/02/2021 1123 10/22/21 0316 10/23/21 0316  WBC 19.4* 20.3* 22.4*  NEUTROABS 17.1* 17.4*  --   HGB 12.9 11.4* 11.8*  HCT 43.4 38.0 38.6  MCV 65.4* 65.4* 64.7*  PLT 969* 846* 798*   Cardiac Enzymes: No results for input(s): CKTOTAL, CKMB, CKMBINDEX, TROPONINI in the last 168 hours. BNP: Invalid input(s): POCBNP CBG: Recent Labs  Lab 10/22/21 1700 10/22/21 2214 10/23/21 0728 10/23/21 1209 10/23/21 1655  GLUCAP 250* 255* 241* 228* 264*   D-Dimer No results for input(s): DDIMER in the last 72 hours. Hgb A1c Recent Labs    10/14/2021 1550  HGBA1C 7.6*   Lipid Profile No results for input(s): CHOL, HDL, LDLCALC, TRIG, CHOLHDL, LDLDIRECT in the last 72 hours. Thyroid function studies No results for input(s): TSH, T4TOTAL, T3FREE, THYROIDAB in  the last 72 hours.  Invalid input(s): FREET3 Anemia work up No results for input(s): VITAMINB12, FOLATE, FERRITIN, TIBC, IRON, RETICCTPCT in the last 72 hours. Microbiology Recent Results (from the past 240 hour(s))  Resp Panel by RT-PCR (Flu A&B, Covid) Nasopharyngeal Swab     Status: None   Collection Time: 10/13/21  9:25 PM   Specimen: Nasopharyngeal Swab; Nasopharyngeal(NP) swabs in vial transport medium  Result Value Ref Range Status   SARS Coronavirus 2 by RT PCR NEGATIVE NEGATIVE Final    Comment: (NOTE) SARS-CoV-2 target nucleic acids are NOT DETECTED.  The SARS-CoV-2 RNA is generally detectable in upper respiratory specimens during the acute phase of infection. The lowest concentration of SARS-CoV-2 viral copies this assay can detect is 138 copies/mL. A negative result  does not preclude SARS-Cov-2 infection and should not be used as the sole basis for treatment or other patient management decisions. A negative result may occur with  improper specimen collection/handling, submission of specimen other than nasopharyngeal swab, presence of viral mutation(s) within the areas targeted by this assay, and inadequate number of viral copies(<138 copies/mL). A negative result must be combined with clinical observations, patient history, and epidemiological information. The expected result is Negative.  Fact Sheet for Patients:  EntrepreneurPulse.com.au  Fact Sheet for Healthcare Providers:  IncredibleEmployment.be  This test is no t yet approved or cleared by the [place] FDA and  has been authorized for detection and/or diagnosis of SARS-CoV-2 by FDA under an Emergency Use Authorization (EUA). This EUA will remain  in effect (meaning this test can be used) for the duration of the COVID-19 declaration under Section 564(b)(1) of the Act, 21 U.S.C.section 360bbb-3(b)(1), unless the authorization is terminated  or revoked sooner.       Influenza A by PCR NEGATIVE NEGATIVE Final   Influenza B by PCR NEGATIVE NEGATIVE Final    Comment: (NOTE) The Xpert Xpress SARS-CoV-2/FLU/RSV plus assay is intended as an aid in the diagnosis of influenza from Nasopharyngeal swab specimens and should not be used as a sole basis for treatment. Nasal washings and aspirates are unacceptable for Xpert Xpress SARS-CoV-2/FLU/RSV testing.  Fact Sheet for Patients: EntrepreneurPulse.com.au  Fact Sheet for Healthcare Providers: IncredibleEmployment.be  This test is not yet approved or cleared by the [place] FDA and has been authorized for detection and/or diagnosis of SARS-CoV-2 by FDA under an Emergency Use Authorization (EUA). This EUA will remain in effect (meaning this test can be used) for  the duration of the COVID-19 declaration under Section 564(b)(1) of the Act, 21 U.S.C. section 360bbb-3(b)(1), unless the authorization is terminated or revoked.  Performed at [facility], [place] [address], [place], [place] [address]   Blood culture (routine x 2)     Status: None (Preliminary result)   Collection Time: 11/07/2021 11:23 AM   Specimen: BLOOD  Result Value Ref Range Status   Specimen Description   Final    BLOOD BLOOD LEFT HAND Performed at [facility] [address], [place], [place] [address]    Special Requests   Final    BOTTLES DRAWN AEROBIC AND ANAEROBIC Blood Culture adequate volume Performed at [facility] [address], [place], [place] [address]    Culture   Final    NO GROWTH 2 DAYS Performed at [facility] [address], [place],  [address]    Report Status PENDING  Incomplete  Blood culture (routine x 2)     Status: None (Preliminary  result)   Collection Time: 11/02/2021 11:28 AM   Specimen: BLOOD  Result Value Ref Range Status   Specimen Description   Final    BLOOD LEFT ANTECUBITAL Performed at [facility] [address] [place], [place] [address]    Special Requests   Final    BOTTLES DRAWN AEROBIC AND ANAEROBIC Blood Culture results may not be optimal due to an inadequate volume of blood received in culture bottles Performed at [facility] [address], [place], [place] [address]    Culture   Final    NO GROWTH 2 DAYS Performed at [facility] [address] [place], [place] [address]    Report Status PENDING  Incomplete      Studies:  US Paracentesis  Result Date: 10/23/2021 INDICATION: Ascites EXAM: ULTRASOUND GUIDED RLQ PARACENTESIS MEDICATIONS: None. COMPLICATIONS: None immediate. PROCEDURE: Informed written consent was obtained from the patient after a discussion of the risks, benefits and alternatives to treatment. A  timeout was performed prior to the initiation of the procedure. Initial ultrasound scanning demonstrates a large amount of ascites within the right lower abdominal quadrant. The right lower abdomen was prepped and draped in the usual sterile fashion. 1% lidocaine was used for local anesthesia. Following this, a 19 gauge, 10-cm, Yueh catheter was introduced. An ultrasound image was saved for documentation purposes. The paracentesis was performed. The catheter was removed and a dressing was applied. The patient tolerated the procedure well without immediate post procedural complication. FINDINGS: A total of approximately 1.3 L of yellow fluid was removed. Samples were sent to the laboratory as requested by the clinical team. IMPRESSION: Successful ultrasound-guided paracentesis yielding 1.3 liters of peritoneal fluid. Read and performed by: Alexandria Lodge, PA-C Electronically Signed   By: Jacqulynn Cadet M.D.   On: 10/23/2021 15:29    Assessment: 67 y.o. female  Acute encephalopathy, likely secondary to sepsis and underlying malignancy Sepsis secondary to UTI History of gallbladder cancer, new overeating and peritoneal metastatic disease, with new ascites AKI  Severe protein and calorie malnutrition DM HISTORY OF DVT  History of stroke, meningioma and memory deficit and depression     Plan: -She underwent paracentesis today, cytology was sent, results still pending. -Her recent CT scan showed a large right ovarian mass, and the suspicion for additional peritoneal metastasis.  If her cytology from paracentesis is negative for malignancy, I will talk to IR to see if they can get tissue biopsy of her ovarian mass or other peritoneal mass  -antibiotics per primary team -I spoke with her two sisters about her overall poor prognosis. If her cytology or tissue biopsy confirms malignancy, she is a poor candidate for chemotherapy. Her sisters remains hopeful that she will recover, due to a very similar  presentation last year that she was hospitalized for overa month but eventually recovered. I told them that things are different now since she likely has metastatic cancer -continue supportive care, please consult palliative care  -I will f/u when her cytology returns    Truitt Merle, MD 10/23/2021

## 2021-10-24 DIAGNOSIS — A419 Sepsis, unspecified organism: Secondary | ICD-10-CM | POA: Diagnosis not present

## 2021-10-24 LAB — COMPREHENSIVE METABOLIC PANEL
ALT: 24 U/L (ref 0–44)
AST: 24 U/L (ref 15–41)
Albumin: 2.1 g/dL — ABNORMAL LOW (ref 3.5–5.0)
Alkaline Phosphatase: 96 U/L (ref 38–126)
Anion gap: 12 (ref 5–15)
BUN: 47 mg/dL — ABNORMAL HIGH (ref 8–23)
CO2: 22 mmol/L (ref 22–32)
Calcium: 8.3 mg/dL — ABNORMAL LOW (ref 8.9–10.3)
Chloride: 92 mmol/L — ABNORMAL LOW (ref 98–111)
Creatinine, Ser: 1.55 mg/dL — ABNORMAL HIGH (ref 0.44–1.00)
GFR, Estimated: 37 mL/min — ABNORMAL LOW (ref >60)
Glucose, Bld: 236 mg/dL — ABNORMAL HIGH (ref 70–99)
Potassium: 3.9 mmol/L (ref 3.5–5.1)
Sodium: 126 mmol/L — ABNORMAL LOW (ref 135–145)
Total Bilirubin: 0.8 mg/dL (ref 0.3–1.2)
Total Protein: 6.1 g/dL — ABNORMAL LOW (ref 6.5–8.1)

## 2021-10-24 LAB — GLUCOSE, CAPILLARY
Glucose-Capillary: 250 mg/dL — ABNORMAL HIGH (ref 70–99)
Glucose-Capillary: 241 mg/dL — ABNORMAL HIGH (ref 70–99)
Glucose-Capillary: 253 mg/dL — ABNORMAL HIGH (ref 70–99)
Glucose-Capillary: 255 mg/dL — ABNORMAL HIGH (ref 70–99)

## 2021-10-24 LAB — CBC
HCT: 37 % (ref 36.0–46.0)
Hemoglobin: 11.3 g/dL — ABNORMAL LOW (ref 12.0–15.0)
MCH: 19.4 pg — ABNORMAL LOW (ref 26.0–34.0)
MCHC: 30.5 g/dL (ref 30.0–36.0)
MCV: 63.5 fL — ABNORMAL LOW (ref 80.0–100.0)
Platelets: 738 10*3/uL — ABNORMAL HIGH (ref 150–400)
RBC: 5.83 MIL/uL — ABNORMAL HIGH (ref 3.87–5.11)
RDW: 16.1 % — ABNORMAL HIGH (ref 11.5–15.5)
WBC: 20.3 10*3/uL — ABNORMAL HIGH (ref 4.0–10.5)
nRBC: 0.1 % (ref 0.0–0.2)

## 2021-10-24 LAB — CYTOLOGY - NON PAP

## 2021-10-24 MED ORDER — PROCHLORPERAZINE EDISYLATE 10 MG/2ML IJ SOLN
10.0000 mg | INTRAMUSCULAR | Status: DC | PRN
  Administered 2021-10-24: 10 mg via INTRAVENOUS
  Filled 2021-10-24: qty 2

## 2021-10-24 MED ORDER — ENOXAPARIN SODIUM 40 MG/0.4ML IJ SOSY: 40.0000 mg | PREFILLED_SYRINGE | INTRAMUSCULAR | Status: DC

## 2021-10-24 NOTE — Progress Notes (Signed)
Referring Physician(s): Feng,Y/[place],W  Supervising Physician: Aletta Edouard  Patient Status:  [facility] - In-pt  Chief Complaint:  Right ovarian mass, abdominal pain/distension  Subjective: Pt known to IR service from paracentesis yesterday - cytology negative. She is a 66 yo female with PMH gallbladder cancer, DM, DVT, prior stroke, meningioma, memory deficit , depression, PCM. She was admitted to [facility] on 10/24/2021 with AMS, gen weakness, diminished appetite, dehydration/AKI. Recent CT A/P reveals:  1. Large right lower quadrant and pelvic masses most consistent with malignancy, possibly ovarian in origin. 2. Small ascites, new since the prior CT. 3. No bowel obstruction. Normal appendix.   She is afebrile, WBC 20.3, hgb 11.3, plts 738k, creat 1.55; perit fluid cx pending. Request now received for image guided aspiration/biopsy of rt abd mass.   Past Medical History:  Diagnosis Date   Depression    Diabetes mellitus without complication ([place])    Family history of breast cancer    Family history of prostate cancer    Overactive bladder    Past Surgical History:  Procedure Laterality Date   CHOLECYSTECTOMY        Allergies: Patient has no known allergies.  Medications: Prior to Admission medications   Medication Sig Start Date End Date Taking? Authorizing Provider  lovastatin (ALTOPREV) 20 MG 24 hr tablet Take 20 mg by mouth at bedtime.   Yes [provider]  megestrol (MEGACE) 40 MG/ML suspension Take 15.6 mls by mouth once daily 10/18/21  Yes Truitt Merle, MD  metFORMIN (GLUCOPHAGE-XR) 500 MG 24 hr tablet Take 500 mg by mouth 2 (two) times daily. 05/15/21  Yes [provider]  prochlorperazine (COMPAZINE) 5 MG tablet Take 1-2 tablets (5-10 mg total) by mouth every 6 (six) hours as needed for nausea or vomiting. 10/18/21  Yes Truitt Merle, MD  risperiDONE (RISPERDAL) 1 MG tablet Take 1 mg by mouth at bedtime. 07/10/21  Yes [provider]  traMADol  (ULTRAM) 50 MG tablet Take 1 tablet (50 mg total) by mouth every 6 (six) hours as needed. Patient taking differently: Take 50 mg by mouth every 6 (six) hours as needed for moderate pain. 10/18/21  Yes Truitt Merle, MD  venlafaxine XR (EFFEXOR-XR) 75 MG 24 hr capsule Take 75 mg by mouth daily. 07/10/21  Yes [provider]  cephALEXin (KEFLEX) 500 MG capsule Take 1 capsule (500 mg total) by mouth 4 (four) times daily. Patient not taking: Reported on 10/22/2021 10/14/21   Regan Lemming, MD  ondansetron (ZOFRAN-ODT) 4 MG disintegrating tablet Take 1 tablet (4 mg total) by mouth every 8 (eight) hours as needed for nausea or vomiting. Patient not taking: Reported on 10/22/2021 10/14/21   Regan Lemming, MD  XARELTO 20 MG TABS tablet TAKE 1 TABLET BY MOUTH DAILY WITH EVENING MEAL Patient not taking: Reported on 10/22/2021 10/17/21   Alla Feeling, NP     Vital Signs: BP 116/87    Pulse (!) 112    Temp 98 F (36.7 C) (Oral)    Resp 18    Ht 5\' 6"  (1.676 m)    Wt 218 lb 14.4 oz (99.3 kg)    SpO2 95%    BMI 35.33 kg/m   Physical Exam pt lethargic,arousable; chest- sl dim BS bases; heart- tachy but regular rhythm; abd- taut/dist,few BS; bil pretibial edema  Imaging: US Paracentesis  Result Date: 10/23/2021 INDICATION: Ascites EXAM: ULTRASOUND GUIDED RLQ PARACENTESIS MEDICATIONS: None. COMPLICATIONS: None immediate. PROCEDURE: Informed written consent was obtained from the patient after  a discussion of the risks, benefits and alternatives to treatment. A timeout was performed prior to the initiation of the procedure. Initial ultrasound scanning demonstrates a large amount of ascites within the right lower abdominal quadrant. The right lower abdomen was prepped and draped in the usual sterile fashion. 1% lidocaine was used for local anesthesia. Following this, a 19 gauge, 10-cm, Yueh catheter was introduced. An ultrasound image was saved for documentation purposes. The paracentesis was performed. The  catheter was removed and a dressing was applied. The patient tolerated the procedure well without immediate post procedural complication. FINDINGS: A total of approximately 1.3 L of yellow fluid was removed. Samples were sent to the laboratory as requested by the clinical team. IMPRESSION: Successful ultrasound-guided paracentesis yielding 1.3 liters of peritoneal fluid. Read and performed by: Alexandria Lodge, PA-C Electronically Signed   By: Jacqulynn Cadet M.D.   On: 10/23/2021 15:29   DG Chest Portable 1 View  Result Date: 10/10/2021 CLINICAL DATA:  67 year old female with history of fatigue. EXAM: PORTABLE CHEST 1 VIEW COMPARISON:  No priors. FINDINGS: Lung volumes are low. No consolidative airspace disease. No pleural effusions. No pneumothorax. No pulmonary nodule or mass noted. Pulmonary vasculature and the cardiomediastinal silhouette are within normal limits. IMPRESSION: 1. Low lung volumes without radiographic evidence of acute cardiopulmonary disease. Electronically Signed   By: Vinnie Langton M.D.   On: 11/02/2021 11:43   [place] EKG SITE RITE  Result Date: 10/11/2021 If Site Rite image not attached, placement could not be confirmed due to current cardiac rhythm.   Labs:  CBC: Recent Labs    10/16/2021 1123 10/22/21 0316 10/23/21 0316 10/24/21 0404  WBC 19.4* 20.3* 22.4* 20.3*  HGB 12.9 11.4* 11.8* 11.3*  HCT 43.4 38.0 38.6 37.0  PLT 969* 846* 798* 738*    COAGS: No results for input(s): INR, APTT in the last 8760 hours.  BMP: Recent Labs    11/02/2021 1333 10/22/21 0316 10/23/21 0316 10/24/21 0404  NA 127* 127* 127* 126*  K 4.2 3.8 3.8 3.9  CL 87* 90* 91* 92*  CO2 24 24 21* 22  GLUCOSE 341* 271* 259* 236*  BUN 32* 34* 39* 47*  CALCIUM 8.8* 8.6* 8.6* 8.3*  CREATININE 1.82* 1.41* 1.56* 1.55*  GFRNONAA 31* 42* 37* 37*    LIVER FUNCTION TESTS: Recent Labs    11/01/2021 1333 10/22/21 0316 10/23/21 0316 10/23/21 1254 10/24/21 0404  BILITOT 0.9 0.9 1.0  --  0.8   AST 50* 42* 44*  --  24  ALT 39 32 35  --  24  ALKPHOS 107 97 103  --  96  PROT 7.4 6.5 6.5  --  6.1*  ALBUMIN 2.6* 2.3* 2.2* 1.8* 2.1*    Assessment and Plan: Subjective: Pt known to IR service from paracentesis yesterday - cytology negative. She is a 66 yo female with PMH gallbladder cancer, DM, DVT, prior stroke, meningioma, memory deficit , depression, PCM. She was admitted to [facility] on 10/23/2021 with AMS, gen weakness, diminished appetite, dehydration/AKI. Recent CT A/P reveals:  1. Large right lower quadrant and pelvic masses most consistent with malignancy, possibly ovarian in origin. 2. Small ascites, new since the prior CT. 3. No bowel obstruction. Normal appendix.   She is afebrile, WBC 20.3, hgb 11.3, plts 738k, creat 1.55; perit fluid cx pending. Request now received for image guided aspiration/biopsy of rt abd mass.Imaging studies have been reviewed by Dr. Serafina Royals. Risks and benefits of procedure was discussed with the patient's family/daughter/POA Minna Merritts  Chapman  including, but not limited to bleeding, infection, damage to adjacent structures or low yield requiring additional tests.  All of the questions were answered and there is agreement to proceed.  Consent signed and in chart. Procedure tent planned for 1/18.     Electronically Signed: D. Rowe Robert, PA-C 10/24/2021, 4:02 PM   I spent a total of 25 minutes at the the patient's bedside AND on the patient's hospital floor or unit, greater than 50% of which was counseling/coordinating care for image guided right abdominal mass aspiration/biopsy    Patient ID: Pamela Kennedy, female   DOB: 11/26/1955, 66 y.o.   MRN: [id number]

## 2021-10-24 NOTE — Progress Notes (Signed)
Daughter called requesting results from peritoneal diagnostic studies. Informed her that while some of the labs had resulted, it was out of my scope to discuss them with her and that she would need to call back and talk with her mother's provider.  Additionally, she was asking if I could change her code status.  I informed her that I could not change a patient's code status.  That would have to also be done by her physician.

## 2021-10-24 NOTE — Care Management Important Message (Signed)
Important Message  Patient Details IM Letter placed in Patients room. Name: Pamela Kennedy MRN: [id number] Date of Birth: 12/18/1955   Medicare Important Message Given:  Yes     Kerin Salen 10/24/2021, 11:59 AM

## 2021-10-24 NOTE — Evaluation (Signed)
SLP Cancellation Note  Patient Details Name: Pamela Kennedy MRN: [id number] DOB: December 07, 1955   Cancelled treatment:       Reason Eval/Treat Not Completed: Other (comment) (order for swallow evaluation received, note plan for biopsy tomorrow and concern with n/v today, will follow up next date following biopsy, secure chatted RN who is in agreement with plan. thanks.)  Kathleen Lime, MS [place] Office [phone] Cell [phone]  Pamela Kennedy 10/24/2021, 6:44 PM

## 2021-10-24 NOTE — Plan of Care (Signed)

## 2021-10-24 NOTE — Progress Notes (Signed)
PROGRESS NOTE    Pamela Kennedy  ONG:[id number] DOB: 1956-05-27 DOA: 10/11/2021 PCP: Pcp, No   Brief Narrative:  Manda Holstad is a 66 y.o. female with medical history significant of depression, type 2 diabetes, , stage 3B gallbladder cancer, overactive bladder, history of blood clots still taking xarelto (per daughter) - who was brought to the emergency department with progressively worse altered mental status, decreased appetite, confusion and generalized weakness for the past 2 weeks. Given concern for mental status changes, sepsis likely secondary to UTI, and profound dehydration patient was admitted for ongoing care.  Assessment & Plan:  GOALS OF CARE - Lengthy discussion at bedside daily with sisters about patient's worsening status -Repeat discussion with daughter at bedside and son over the phone who requested patient "be made full code until he is able to see her as he does not want her to die before he is able to say goodbye".  Son should be at bedside on 10/25/2021, at which time per our discussion she should be reverted back to DNR status. - We discussed she has an unknown pelvic mass with a known history of gallbladder cancer concerning for metastatic disease especially in the setting of new ascites as below.  Sepsis secondary to presumed UTI, cannot rule out concurrent/secondary infections -rule out SBP, likely POA - Continue cefepime, vancomycin, Flagyl per pharmacy - Follows cultures -paracentesis pending, will follow gram stain and cultures as well    Acute metabolic encephalopathy, multifactorial, POA Rule out secondary causes - In the setting of sepsis, dehydration, AKI - Ammonia WNL - Continue supportive care  Primary gall bladder adenocarcinoma (HCC) Unspecified ovarian mass concerning for metastatic disease Profound abdominal ascites - Follow-up with oncology as scheduled. - She is scheduled for biopsy Wednesday at [facility], oncology is attempting to discuss  case with IR to see if biopsy is reasonable while admitted here - Overall prognosis is very poor. - Paracentesis ordered, follow cytology gram stain and cultures  AKI (acute kidney injury) ([place]) -Stable - Lasix IV twice daily x4 doses follow clinically.  Continues to be moderately edematous. - Discussed with family limited options for treating her AKI while being edematous and clearly volume overloaded    Hypovolemic hyponatremia - Discontinue IV fluids. Worsening abdominal distention, lower extremity edema - Follow-up sodium level in AM.   Thrombocytosis - Acute phase reactant likely secondary to sepsis   Severe protein-calorie malnutrition (HCC) - On Megace - Protein supplementation as tolerated   History of blood clots in the setting of cancer  -On Xarelto per family -subcu Lovenox here given possible need for procedures  Type 2 diabetes mellitus ([place]), uncontrolled with hyperglycemia - A1C 7.6 - Continue Carbohydrate modified diet - CBG monitoring with SSI - Hold metformin due to lactic acidosis.  DVT prophylaxis: Lovenox SQ Code Status: DNR Family Communication: At bedside  Status is: Inpt  Dispo: The patient is from: Home              Anticipated d/c is to: TBD              Anticipated d/c date is: 48-72h              Patient currently NOT medically stable for discharge  Consultants:  None  Procedures:  Paracentesis 10/23/2021 -1.3 L withdrawn Pending biopsy with interventional radiology per their schedule  Antimicrobials:   - Cefepime, flagyl  Subjective: No acute issues or events overnight, mental status minimally improving today, remains alert/oriented to person/hospital only.  Patient  continues to have vomiting of clear liquids at bedside but denies nausea, appears profoundly uncomfortable on palpating the abdomen but denies abdominal pain.  Review of systems remains difficult to obtain given her mental status.  Objective: Vitals:   10/23/21 0255  10/23/21 0500 10/23/21 1531 10/24/21 0640  BP: 108/72 121/84 114/79 110/69  Pulse:  (!) 108 (!) 105 (!) 107  Resp:  20 18 16   Temp:  98.4 F (36.9 C) 98 F (36.7 C) 97.6 F (36.4 C)  TempSrc:  Oral Oral Oral  SpO2:  96% 100% 98%  Weight:      Height:        Intake/Output Summary (Last 24 hours) at 10/24/2021 0739 Last data filed at 10/24/2021 0500 Gross per 24 hour  Intake 431.63 ml  Output 200 ml  Net 231.63 ml    Filed Weights   10/24/2021 2205  Weight: 99.3 kg   Examination:  General: Somnolent, arousable but oriented to person and hospital only HEENT:  Normocephalic atraumatic.  Sclerae nonicteric, noninjected. Extraocular movements intact bilaterally. Neck:  Without mass or deformity.  Trachea is midline. Lungs:  Clear to auscultate bilaterally without rhonchi, wheeze, or rales. Heart:  Regular rate and rhythm.  Without murmurs, rubs, or gallops. Abdomen: Distended, tympanic, with moderate tenderness diffusely Extremities: Without cyanosis, clubbing, obvious deformity.  3+ pitting edema bilateral lower extremities to the knee Vascular:  Dorsalis pedis and posterior tibial pulses palpable bilaterally. Skin:  Warm and dry, no erythema  Data Reviewed: I have personally reviewed following labs and imaging studies  CBC: Recent Labs  Lab 10/20/2021 1123 10/22/21 0316 10/23/21 0316 10/24/21 0404  WBC 19.4* 20.3* 22.4* 20.3*  NEUTROABS 17.1* 17.4*  --   --   HGB 12.9 11.4* 11.8* 11.3*  HCT 43.4 38.0 38.6 37.0  MCV 65.4* 65.4* 64.7* 63.5*  PLT 969* 846* 798* 738*    Basic Metabolic Panel: Recent Labs  Lab 10/18/2021 1333 10/22/21 0316 10/23/21 0316 10/24/21 0404  NA 127* 127* 127* 126*  K 4.2 3.8 3.8 3.9  CL 87* 90* 91* 92*  CO2 24 24 21* 22  GLUCOSE 341* 271* 259* 236*  BUN 32* 34* 39* 47*  CREATININE 1.82* 1.41* 1.56* 1.55*  CALCIUM 8.8* 8.6* 8.6* 8.3*  MG 2.5*  --   --   --     GFR: Estimated Creatinine Clearance: 43.6 mL/min (A) (by C-G formula based  on SCr of 1.55 mg/dL (H)). Liver Function Tests: Recent Labs  Lab 10/20/2021 1333 10/22/21 0316 10/23/21 0316 10/23/21 1254 10/24/21 0404  AST 50* 42* 44*  --  24  ALT 39 32 35  --  24  ALKPHOS 107 97 103  --  96  BILITOT 0.9 0.9 1.0  --  0.8  PROT 7.4 6.5 6.5  --  6.1*  ALBUMIN 2.6* 2.3* 2.2* 1.8* 2.1*    Recent Labs  Lab 11/02/2021 1333  LIPASE 33    Recent Labs  Lab 10/22/21 1230  AMMONIA 26    Coagulation Profile: No results for input(s): INR, PROTIME in the last 168 hours. Cardiac Enzymes: No results for input(s): CKTOTAL, CKMB, CKMBINDEX, TROPONINI in the last 168 hours. BNP (last 3 results) No results for input(s): PROBNP in the last 8760 hours. HbA1C: Recent Labs    10/20/2021 1550  HGBA1C 7.6*    CBG: Recent Labs  Lab 10/22/21 2214 10/23/21 0728 10/23/21 1209 10/23/21 1655 10/23/21 2109  GLUCAP 255* 241* 228* 264* 234*    Lipid Profile: No results  for input(s): CHOL, HDL, LDLCALC, TRIG, CHOLHDL, LDLDIRECT in the last 72 hours. Thyroid Function Tests: No results for input(s): TSH, T4TOTAL, FREET4, T3FREE, THYROIDAB in the last 72 hours. Anemia Panel: No results for input(s): VITAMINB12, FOLATE, FERRITIN, TIBC, IRON, RETICCTPCT in the last 72 hours. Sepsis Labs: Recent Labs  Lab 10/15/2021 1123 10/30/2021 2254  LATICACIDVEN 3.0* 1.8    Recent Results (from the past 240 hour(s))  Blood culture (routine x 2)     Status: None (Preliminary result)   Collection Time: 10/20/2021 11:23 AM   Specimen: BLOOD  Result Value Ref Range Status   Specimen Description   Final    BLOOD BLOOD LEFT HAND Performed at [facility] [address], [place], [place] [address]    Special Requests   Final    BOTTLES DRAWN AEROBIC AND ANAEROBIC Blood Culture adequate volume Performed at [facility] [address], [place], [place] [address]    Culture   Final    NO GROWTH 2 DAYS Performed at [facility]  [address] [place], [place] [address]    Report Status PENDING  Incomplete  Blood culture (routine x 2)     Status: None (Preliminary result)   Collection Time: 10/31/2021 11:28 AM   Specimen: BLOOD  Result Value Ref Range Status   Specimen Description   Final    BLOOD LEFT ANTECUBITAL Performed at [facility] [address], [place], [place] [address]    Special Requests   Final    BOTTLES DRAWN AEROBIC AND ANAEROBIC Blood Culture results may not be optimal due to an inadequate volume of blood received in culture bottles Performed at [facility] [address], [place], [place] [address]    Culture   Final    NO GROWTH 2 DAYS Performed at [facility] [address], [place], [place] [address]    Report Status PENDING  Incomplete  Body fluid culture w Gram Stain     Status: None (Preliminary result)   Collection Time: 10/23/21  3:13 PM   Specimen: PATH Cytology Peritoneal fluid  Result Value Ref Range Status   Specimen Description   Final    PERITONEAL Performed at [facility] [address] [place], [place] [address]    Special Requests   Final    NONE Performed at [facility] [place] [address], [place], [place] [address]    Gram Stain   Final    NO SQUAMOUS EPITHELIAL CELLS SEEN NO WBC SEEN NO ORGANISMS SEEN Performed at [facility], [place] [address], [place],  [address]    Culture PENDING  Incomplete   Report Status PENDING  Incomplete   Radiology Studies: US Paracentesis  Result Date: 10/23/2021 INDICATION: Ascites EXAM: ULTRASOUND GUIDED RLQ PARACENTESIS MEDICATIONS: None. COMPLICATIONS: None immediate. PROCEDURE: Informed written consent was obtained from the patient after a discussion of the risks, benefits and alternatives to treatment. A timeout was performed prior to the initiation of the procedure. Initial ultrasound scanning demonstrates a large amount of ascites within  the right lower abdominal quadrant. The right lower abdomen was prepped and draped in the usual sterile fashion. 1% lidocaine was used for local anesthesia. Following this, a 19 gauge, 10-cm, Yueh catheter was introduced. An ultrasound image was saved for documentation purposes. The paracentesis was performed. The catheter was removed and a dressing was applied. The patient tolerated the procedure well without immediate post  procedural complication. FINDINGS: A total of approximately 1.3 L of yellow fluid was removed. Samples were sent to the laboratory as requested by the clinical team. IMPRESSION: Successful ultrasound-guided paracentesis yielding 1.3 liters of peritoneal fluid. Read and performed by: Alexandria Lodge, PA-C Electronically Signed   By: Jacqulynn Cadet M.D.   On: 10/23/2021 15:29    Scheduled Meds:  Chlorhexidine Gluconate Cloth  6 each Topical Daily   enoxaparin (LOVENOX) injection  40 mg Subcutaneous Q24H   furosemide  40 mg Intravenous BID   insulin aspart  0-9 Units Subcutaneous TID WC   mouth rinse  15 mL Mouth Rinse BID   polyethylene glycol  17 g Oral Daily   senna-docusate  1 tablet Oral BID   sodium chloride flush  10-40 mL Intracatheter Q12H   Continuous Infusions:  ceFEPime (MAXIPIME) IV 2 g (10/24/21 0019)   metronidazole 500 mg (10/24/21 0449)   vancomycin 1,250 mg (10/23/21 0132)    LOS: 3 days   Time spent: 24min  Nellene Courtois C Jazzlin Clements, DO [facility]  If 7PM-7AM, please contact night-coverage [email or web address]  10/24/2021, 7:39 AM

## 2021-10-24 NOTE — Progress Notes (Signed)
Oncology brief note   I spoke with pathologist this morning, her cytology from paracentesis was negative for malignant cells.  I will request IR abdominal mass biopsy.  Pamela Kennedy  10/24/2021

## 2021-10-24 NOTE — TOC Progression Note (Signed)
Transition of Care [facility]) - Progression Note    Patient Details  Name: Pamela Kennedy MRN: [id number] Date of Birth: 29-Apr-1956  [facility] CM/SW Contact  Bralyn Espino, Juliann Pulse, RN Phone Number: 10/24/2021, 3:53 PM  Clinical Narrative:  Damaris Schooner to tr [place]-she understands there is continued work up being done-informed her CM following to asst w/transition plans.     Expected Discharge Plan:  (TBD) Barriers to Discharge: Continued Medical Work up  Expected Discharge Plan and Services Expected Discharge Plan:  (TBD)                                               Social Determinants of Health (SDOH) Interventions    Readmission Risk Interventions No flowsheet data found.

## 2021-10-24 NOTE — Progress Notes (Signed)
Inpatient Diabetes Program Recommendations  AACE/ADA: New Consensus Statement on Inpatient Glycemic Control   Target Ranges:  Prepandial:   less than 140 mg/dL      Peak postprandial:   less than 180 mg/dL (1-2 hours)      Critically ill patients:  140 - 180 mg/dL    Latest Reference Range & Units 10/23/21 07:28 10/23/21 12:09 10/23/21 16:55 10/23/21 21:09 10/24/21 07:44  Glucose-Capillary 70 - 99 mg/dL 241 (H) 228 (H) 264 (H) 234 (H) 250 (H)  (H): Data is abnormally high  Review of Glycemic Control  Diabetes history: DM2 Outpatient Diabetes medications: Metformin 500 mg BID Current orders for Inpatient glycemic control: Novolog 0-9 units TID with meals  Inpatient Diabetes Program Recommendations:    Insulin: Please consider ordering Levemir 5 units BID. If PO intake is poor, may also want to consider changing CBGs to Q4H and Novolog 0-9 units to Q4H.  Thanks, Barnie Alderman, RN, MSN, CDE Diabetes Coordinator Inpatient Diabetes Program [phone] (Team Pager from 8am to 5pm)

## 2021-10-24 NOTE — Progress Notes (Signed)
Pamela Kennedy   DOB:March 30, 1956   VZ#:[id number]   AJO#:[id number]  Oncology follow up note   Subjective: Pamela Kennedy was siting in recliner when I saw her in the late afternoon.  She is still drowsy, arousable, able to answer questions, but does not actively participate in the conversation.  2 sisters was at the bedside.    Objective:  Vitals:   10/24/21 1402 10/24/21 2109  BP: 116/87 (!) 106/51  Pulse: (!) 112 (!) 105  Resp: 18 16  Temp: 98 F (36.7 C) 98 F (36.7 C)  SpO2: 95% 100%    Body mass index is 35.33 kg/m.  Intake/Output Summary (Last 24 hours) at 10/24/2021 2307 Last data filed at 10/24/2021 2122 Gross per 24 hour  Intake 921.63 ml  Output 200 ml  Net 721.63 ml     Sclerae unicteric  Oropharynx clear  No peripheral adenopathy  Lungs clear -- no rales or rhonchi  Heart regular rate and rhythm  Abdomen distended, with diffuse tenderness  MSK no focal spinal tenderness, no peripheral edema    CBG (last 3)  Recent Labs    10/24/21 1203 10/24/21 1711 10/24/21 2102  GLUCAP 241* 253* 255*     Labs:  Urine Studies No results for input(s): UHGB, CRYS in the last 72 hours.  Invalid input(s): UACOL, UAPR, USPG, UPH, UTP, UGL, UKET, UBIL, UNIT, UROB, [place], UEPI, UWBC, Junie Panning [place], [place], [place]  Basic Metabolic Panel: Recent Labs  Lab 11/03/2021 1333 10/22/21 0316 10/23/21 0316 10/24/21 0404  NA 127* 127* 127* 126*  K 4.2 3.8 3.8 3.9  CL 87* 90* 91* 92*  CO2 24 24 21* 22  GLUCOSE 341* 271* 259* 236*  BUN 32* 34* 39* 47*  CREATININE 1.82* 1.41* 1.56* 1.55*  CALCIUM 8.8* 8.6* 8.6* 8.3*  MG 2.5*  --   --   --    GFR Estimated Creatinine Clearance: 43.6 mL/min (A) (by C-G formula based on SCr of 1.55 mg/dL (H)). Liver Function Tests: Recent Labs  Lab 10/12/2021 1333 10/22/21 0316 10/23/21 0316 10/23/21 1254 10/24/21 0404  AST 50* 42* 44*  --  24  ALT 39 32 35  --  24  ALKPHOS 107 97 103  --  96  BILITOT 0.9 0.9 1.0  --  0.8  PROT 7.4 6.5  6.5  --  6.1*  ALBUMIN 2.6* 2.3* 2.2* 1.8* 2.1*   Recent Labs  Lab 10/09/2021 1333  LIPASE 33   Recent Labs  Lab 10/22/21 1230  AMMONIA 26   Coagulation profile No results for input(s): INR, PROTIME in the last 168 hours.  CBC: Recent Labs  Lab 11/06/2021 1123 10/22/21 0316 10/23/21 0316 10/24/21 0404  WBC 19.4* 20.3* 22.4* 20.3*  NEUTROABS 17.1* 17.4*  --   --   HGB 12.9 11.4* 11.8* 11.3*  HCT 43.4 38.0 38.6 37.0  MCV 65.4* 65.4* 64.7* 63.5*  PLT 969* 846* 798* 738*   Cardiac Enzymes: No results for input(s): CKTOTAL, CKMB, CKMBINDEX, TROPONINI in the last 168 hours. BNP: Invalid input(s): POCBNP CBG: Recent Labs  Lab 10/23/21 2109 10/24/21 0744 10/24/21 1203 10/24/21 1711 10/24/21 2102  GLUCAP 234* 250* 241* 253* 255*   D-Dimer No results for input(s): DDIMER in the last 72 hours. Hgb A1c No results for input(s): HGBA1C in the last 72 hours.  Lipid Profile No results for input(s): CHOL, HDL, LDLCALC, TRIG, CHOLHDL, LDLDIRECT in the last 72 hours. Thyroid function studies No results for input(s): TSH, T4TOTAL, T3FREE, THYROIDAB in the last 72  hours.  Invalid input(s): FREET3 Anemia work up No results for input(s): VITAMINB12, FOLATE, FERRITIN, TIBC, IRON, RETICCTPCT in the last 72 hours. Microbiology Recent Results (from the past 240 hour(s))  Blood culture (routine x 2)     Status: None (Preliminary result)   Collection Time: 10/14/2021 11:23 AM   Specimen: BLOOD  Result Value Ref Range Status   Specimen Description   Final    BLOOD BLOOD LEFT HAND Performed at [facility] [address], [place], [place] [address]    Special Requests   Final    BOTTLES DRAWN AEROBIC AND ANAEROBIC Blood Culture adequate volume Performed at [facility] [address] [place], [place] [address]    Culture   Final    NO GROWTH 3 DAYS Performed at [facility], [place] [address], [place], [place] [address]    Report  Status PENDING  Incomplete  Blood culture (routine x 2)     Status: None (Preliminary result)   Collection Time: 10/11/2021 11:28 AM   Specimen: BLOOD  Result Value Ref Range Status   Specimen Description   Final    BLOOD LEFT ANTECUBITAL Performed at [facility] [address], [place], [place] [address]    Special Requests   Final    BOTTLES DRAWN AEROBIC AND ANAEROBIC Blood Culture results may not be optimal due to an inadequate volume of blood received in culture bottles Performed at [facility] [address], [place], [place] [address]    Culture   Final    NO GROWTH 3 DAYS Performed at [facility], [place] [address] [place], [place] [address]    Report Status PENDING  Incomplete  Body fluid culture w Gram Stain     Status: None (Preliminary result)   Collection Time: 10/23/21  3:13 PM   Specimen: PATH Cytology Peritoneal fluid  Result Value Ref Range Status   Specimen Description   Final    PERITONEAL Performed at [facility] [address], [place], [place] [address]    Special Requests   Final    NONE Performed at [facility], [place] [address], [place], [place] [address]    Gram Stain   Final    NO SQUAMOUS EPITHELIAL CELLS SEEN NO WBC SEEN NO ORGANISMS SEEN    Culture   Final    NO GROWTH < 24 HOURS Performed at [facility], [place] [address], [place], [place] [address]    Report Status PENDING  Incomplete      Studies:  US Paracentesis  Result Date: 10/23/2021 INDICATION: Ascites EXAM: ULTRASOUND GUIDED RLQ PARACENTESIS MEDICATIONS: None. COMPLICATIONS: None immediate. PROCEDURE: Informed written consent was obtained from the patient after a discussion of the risks, benefits and alternatives to treatment. A timeout was performed prior to the initiation of the procedure. Initial ultrasound scanning demonstrates a large amount of ascites within the right lower abdominal  quadrant. The right lower abdomen was prepped and draped in the usual sterile fashion. 1% lidocaine was used for local anesthesia. Following this, a 19 gauge, 10-cm, Yueh catheter was introduced. An ultrasound image was saved for documentation purposes. The paracentesis was performed. The catheter was removed and a dressing was applied. The patient tolerated the procedure well without immediate post procedural complication. FINDINGS: A total of approximately 1.3 L of yellow fluid was removed. Samples were sent to the laboratory as requested by the clinical team. IMPRESSION: Successful  ultrasound-guided paracentesis yielding 1.3 liters of peritoneal fluid. Read and performed by: Alexandria Lodge, PA-C Electronically Signed   By: Jacqulynn Cadet M.D.   On: 10/23/2021 15:29    Assessment: 66 y.o. female  Acute encephalopathy, likely secondary to sepsis and underlying malignancy Sepsis secondary to UTI History of gallbladder cancer, new overeating and peritoneal metastatic disease, with new ascites AKI  Severe protein and calorie malnutrition DM HISTORY OF DVT  History of stroke, meningioma and memory deficit and depression     Plan: -The cytology from paracentesis yesterday was negative for malignant cells. -I recommend CT-guided abdominal mass biopsy by IR, I reviewed this with her daughter and 2 sisters, they all would like to proceed with biopsy -I discussed that she may not be a candidate for chemotherapy, given her overall poor performance status and the medical comorbidities.  Her family voiced understanding.  We briefly reviewed hospice, I feel her daughter is open to it.  -Dr. Avon Gully has discussed [place] with her family, her daughter, patient's POA, has agreed with DNR. -Continue supportive care, I will follow-up when her biopsy returns   Truitt Merle, MD 10/24/2021

## 2021-10-25 ENCOUNTER — Encounter (HOSPITAL_COMMUNITY): Payer: Self-pay | Admitting: Family Medicine

## 2021-10-25 ENCOUNTER — Ambulatory Visit: Payer: Medicare Other | Admitting: Neurology

## 2021-10-25 ENCOUNTER — Inpatient Hospital Stay (HOSPITAL_COMMUNITY): Payer: Medicare Other

## 2021-10-25 DIAGNOSIS — Z7189 Other specified counseling: Secondary | ICD-10-CM | POA: Diagnosis not present

## 2021-10-25 DIAGNOSIS — J9601 Acute respiratory failure with hypoxia: Secondary | ICD-10-CM | POA: Diagnosis not present

## 2021-10-25 DIAGNOSIS — Z515 Encounter for palliative care: Secondary | ICD-10-CM

## 2021-10-25 DIAGNOSIS — N179 Acute kidney failure, unspecified: Secondary | ICD-10-CM

## 2021-10-25 DIAGNOSIS — A419 Sepsis, unspecified organism: Secondary | ICD-10-CM | POA: Diagnosis not present

## 2021-10-25 DIAGNOSIS — J810 Acute pulmonary edema: Secondary | ICD-10-CM

## 2021-10-25 LAB — BLOOD GAS, ARTERIAL
Acid-base deficit: 5.2 mmol/L — ABNORMAL HIGH (ref 0.0–2.0)
Bicarbonate: 17.7 mmol/L — ABNORMAL LOW (ref 20.0–28.0)
O2 Saturation: 92.3 %
Patient temperature: 97.6
pCO2 arterial: 27.1 mmHg — ABNORMAL LOW (ref 32.0–48.0)
pH, Arterial: 7.427 (ref 7.350–7.450)
pO2, Arterial: 66.9 mmHg — ABNORMAL LOW (ref 83.0–108.0)

## 2021-10-25 LAB — COMPREHENSIVE METABOLIC PANEL
ALT: 18 U/L (ref 0–44)
AST: 20 U/L (ref 15–41)
Albumin: 2.2 g/dL — ABNORMAL LOW (ref 3.5–5.0)
Alkaline Phosphatase: 92 U/L (ref 38–126)
Anion gap: 15 (ref 5–15)
BUN: 56 mg/dL — ABNORMAL HIGH (ref 8–23)
CO2: 21 mmol/L — ABNORMAL LOW (ref 22–32)
Calcium: 8.9 mg/dL (ref 8.9–10.3)
Chloride: 93 mmol/L — ABNORMAL LOW (ref 98–111)
Creatinine, Ser: 1.85 mg/dL — ABNORMAL HIGH (ref 0.44–1.00)
GFR, Estimated: 30 mL/min — ABNORMAL LOW (ref >60)
Glucose, Bld: 238 mg/dL — ABNORMAL HIGH (ref 70–99)
Potassium: 3.9 mmol/L (ref 3.5–5.1)
Sodium: 129 mmol/L — ABNORMAL LOW (ref 135–145)
Total Bilirubin: 1 mg/dL (ref 0.3–1.2)
Total Protein: 6.3 g/dL — ABNORMAL LOW (ref 6.5–8.1)

## 2021-10-25 LAB — CBC
HCT: 37.5 % (ref 36.0–46.0)
Hemoglobin: 11.8 g/dL — ABNORMAL LOW (ref 12.0–15.0)
MCH: 19.7 pg — ABNORMAL LOW (ref 26.0–34.0)
MCHC: 31.5 g/dL (ref 30.0–36.0)
MCV: 62.7 fL — ABNORMAL LOW (ref 80.0–100.0)
Platelets: 758 10*3/uL — ABNORMAL HIGH (ref 150–400)
RBC: 5.98 MIL/uL — ABNORMAL HIGH (ref 3.87–5.11)
RDW: 17.5 % — ABNORMAL HIGH (ref 11.5–15.5)
WBC: 23.2 10*3/uL — ABNORMAL HIGH (ref 4.0–10.5)
nRBC: 0.1 % (ref 0.0–0.2)

## 2021-10-25 LAB — MRSA NEXT GEN BY PCR, NASAL: MRSA by PCR Next Gen: NOT DETECTED

## 2021-10-25 LAB — PROTIME-INR
INR: 1.5 — ABNORMAL HIGH (ref 0.8–1.2)
Prothrombin Time: 17.7 seconds — ABNORMAL HIGH (ref 11.4–15.2)

## 2021-10-25 LAB — AMMONIA: Ammonia: 31 umol/L (ref 9–35)

## 2021-10-25 LAB — GLUCOSE, CAPILLARY: Glucose-Capillary: 254 mg/dL — ABNORMAL HIGH (ref 70–99)

## 2021-10-25 MED ORDER — LORAZEPAM 2 MG/ML IJ SOLN: 1.0000 mg | INTRAMUSCULAR | Status: DC | PRN

## 2021-10-25 MED ORDER — POLYVINYL ALCOHOL 1.4 % OP SOLN: 1.0000 [drp] | Freq: Four times a day (QID) | OPHTHALMIC | Status: DC | PRN

## 2021-10-25 MED ORDER — CHLORHEXIDINE GLUCONATE 0.12 % MT SOLN
15.0000 mL | Freq: Two times a day (BID) | OROMUCOSAL | Status: DC
  Administered 2021-10-25: 15 mL via OROMUCOSAL

## 2021-10-25 MED ORDER — SODIUM CHLORIDE 0.9 % IV BOLUS
500.0000 mL | Freq: Once | INTRAVENOUS | Status: AC
  Administered 2021-10-25: 500 mL via INTRAVENOUS

## 2021-10-25 MED ORDER — GLYCOPYRROLATE 0.2 MG/ML IJ SOLN
0.2000 mg | INTRAMUSCULAR | Status: DC | PRN
  Filled 2021-10-25: qty 1

## 2021-10-25 MED ORDER — DIPHENHYDRAMINE HCL 50 MG/ML IJ SOLN: 25.0000 mg | INTRAMUSCULAR | Status: DC | PRN

## 2021-10-25 MED ORDER — ORAL CARE MOUTH RINSE
15.0000 mL | Freq: Two times a day (BID) | OROMUCOSAL | Status: DC
  Administered 2021-10-25 (×2): 15 mL via OROMUCOSAL

## 2021-10-25 MED ORDER — GLYCOPYRROLATE 0.2 MG/ML IJ SOLN
0.2000 mg | INTRAMUSCULAR | Status: DC | PRN
  Administered 2021-10-26 (×2): 0.2 mg via INTRAVENOUS
  Filled 2021-10-25: qty 1

## 2021-10-25 MED ORDER — FENTANYL CITRATE (PF) 100 MCG/2ML IJ SOLN
25.0000 ug | INTRAMUSCULAR | Status: DC | PRN
  Administered 2021-10-25: 100 ug via INTRAVENOUS
  Administered 2021-10-25 (×3): 50 ug via INTRAVENOUS
  Filled 2021-10-25 (×4): qty 2

## 2021-10-25 MED ORDER — SODIUM CHLORIDE 0.9 % IV SOLN
0.5000 mg/h | INTRAVENOUS | Status: DC
  Administered 2021-10-25: 0.5 mg/h via INTRAVENOUS
  Filled 2021-10-25: qty 5

## 2021-10-25 MED ORDER — LIP MEDEX EX OINT
TOPICAL_OINTMENT | CUTANEOUS | Status: DC | PRN
  Administered 2021-10-25: 75 via TOPICAL
  Filled 2021-10-25: qty 7

## 2021-10-25 MED ORDER — FUROSEMIDE 10 MG/ML IJ SOLN: 60.0000 mg | Freq: Once | INTRAMUSCULAR | Status: DC

## 2021-10-25 MED ORDER — FENTANYL CITRATE PF 50 MCG/ML IJ SOSY
12.5000 ug | PREFILLED_SYRINGE | Freq: Once | INTRAMUSCULAR | Status: AC
  Administered 2021-10-25: 12.5 ug via INTRAVENOUS
  Filled 2021-10-25: qty 1

## 2021-10-25 MED ORDER — LACTULOSE 10 GM/15ML PO SOLN: 20.0000 g | Freq: Two times a day (BID) | ORAL | Status: DC

## 2021-10-25 MED ORDER — GLYCOPYRROLATE 1 MG PO TABS: 1.0000 mg | ORAL_TABLET | ORAL | Status: DC | PRN

## 2021-10-25 NOTE — Plan of Care (Signed)
°  Problem: Education: Goal: Knowledge of General Education information will improve Description: Including pain rating scale, medication(s)/side effects and non-pharmacologic comfort measures 10/25/2021 2301 by Luan Moore, RN Outcome: Not Applicable   Problem: Health Behavior/Discharge Planning: Goal: Ability to manage health-related needs will improve 10/25/2021 2301 by Luan Moore, RN Outcome: Not Applicable   Problem: Clinical Measurements: Goal: Ability to maintain clinical measurements within normal limits will improve 10/25/2021 2301 by Luan Moore, RN Outcome: Not Applicable   Problem: Clinical Measurements: Goal: Will remain free from infection 10/25/2021 2301 by Luan Moore, RN Outcome: Not Applicable   Problem: Clinical Measurements: Goal: Diagnostic test results will improve 10/25/2021 2301 by Luan Moore, RN Outcome: Not Applicable   Problem: Clinical Measurements: Goal: Respiratory complications will improve 10/25/2021 2301 by Luan Moore, RN Outcome: Not Applicable   Problem: Clinical Measurements: Goal: Cardiovascular complication will be avoided 10/25/2021 2301 by Luan Moore, RN Outcome: Not Applicable   Problem: Activity: Goal: Risk for activity intolerance will decrease 10/25/2021 2301 by Luan Moore, RN Outcome: Not Applicable   Problem: Nutrition: Goal: Adequate nutrition will be maintained 10/25/2021 2301 by Luan Moore, RN Outcome: Not Applicable   Problem: Coping: Goal: Level of anxiety will decrease 10/25/2021 2301 by Luan Moore, RN Outcome: Not Applicable   Problem: Elimination: Goal: Will not experience complications related to bowel motility 10/25/2021 2301 by Luan Moore, RN Outcome: Not Applicable   Problem: Elimination: Goal: Will not experience complications related to urinary retention 10/25/2021 2301 by Luan Moore, RN Outcome: Not Applicable    Problem: Pain Managment: Goal: General experience of comfort will improve 10/25/2021 2301 by Luan Moore, RN Outcome: Not Applicable   Problem: Safety: Goal: Ability to remain free from injury will improve 10/25/2021 2301 by Luan Moore, RN Outcome: Not Applicable   Problem: Skin Integrity: Goal: Risk for impaired skin integrity will decrease 10/25/2021 2301 by Luan Moore, RN Outcome: Not Applicable

## 2021-10-25 NOTE — Consult Note (Signed)
NAME:  Pamela Kennedy, MRN:  [id number], DOB:  08/12/1956, LOS: 4 ADMISSION DATE:  11/05/2021, CONSULTATION DATE:  10/25/21 REFERRING MD:  Verlon Au- TRH, CHIEF COMPLAINT:  encephalopathy   History of Present Illness:  Pamela Kennedy is a 66 y/o woman with a history of gallbladder adenocarcinoma with recent discovery of a large right ovarian mass pending biopsy.  She was admitted on 1/14 with encephalopathy, decreased appetite, see abdominal pain and distention, generalized weakness for 2 weeks.  She had subjective fevers prior to admission.  She was admitted for sepsis, AKI, hyponatremia.  She underwent paracentesis on 1/16.  She underwent treatment in 2021 for gallbladder adenocarcinoma, but did not finish her Xeloda.  This morning she developed acute encephalopathy and respiratory distress, prompting transfer to the intensive care unit.  There have been ongoing discussions with palliative care, oncology, and her large family regarding CODE STATUS.  Her son is coming from [place] today, and CODE STATUS was reversed from DNR to full code to give him time to arrive.  Pertinent  Medical History  GB adenocarcinoma New ovarian mass  Significant Hospital Events: Including procedures, antibiotic start and stop dates in addition to other pertinent events   1/14 admitted 1/18 moved to ICU for encephalopathy  Interim History / Subjective:    Objective   Blood pressure (!) 107/58, pulse (!) 111, temperature (!) 97.5 F (36.4 C), resp. rate (!) 34, height 5\' 6"  (1.676 m), weight 99.3 kg, SpO2 92 %.        Intake/Output Summary (Last 24 hours) at 10/25/2021 0926 Last data filed at 10/25/2021 0914 Gross per 24 hour  Intake 1310 ml  Output --  Net 1310 ml   Filed Weights   10/20/2021 2205  Weight: 99.3 kg    Examination: General: Critically ill-appearing woman lying in bed in respiratory distress HENT: [place]/AT, eyes anicteric Lungs: Rales bilaterally, tachypneic, mild accessory  muscle use Cardiovascular: S1-S2, tachycardic, regular rhythm Abdomen: Obese, distended, nontender.  No periumbilical varices. Extremities: Anasarca diffusely, no cyanosis Neuro: Confused, arouses to her name, maintains alertness only briefly before falling back asleep.  Can follow some commands. Derm: Warm and dry  Sodium 129 Bicarb 21 BUN 56 Creatinine 1.85 WBC 23.2 H/H 11.8/37.5 Platelets 758 INR 1.5  Resolved Hospital Problem list     Assessment & Plan:  Acute respiratory failure with hypoxia, very concerned about her work of breathing.  Likely acute pulmonary edema at play.  Respiratory distress from large volume ascites could be contributing to work of breathing.  She is also at risk for pleural effusions for the same reason.  Only mild metabolic acidosis, likely is not the primary driver respiratory failure - Lasix - Supplemental oxygen to maintain SPO2 greater than 90%. - Long discussion at bedside.  No plans to intubate as this is not going to reverse her underlying disease process and would not promote improved quality of life. - Once her son arrives, Ativan and fentanyl PRN for air hunger.  Acute metabolic encephalopathy due to renal failure, cancer, potentially sepsis - Supportive care  Sepsis secondary to urinary tract infection -Okay to continue antibiotics  AKI -Renally dose meds, avoid nephrotoxic meds - Fentanyl favored over morphine due to renal dysfunction - Strict I's/O -Foley for comfort  History of gallbladder cancer-adenocarcinoma New ovarian mass, peritoneal metastatic disease -Appreciate oncology's input.  Not a candidate for outpatient therapies.  Severe protein energy malnutrition, debility -Overall very poor prognostic sign. - Pleasure eating  History of DVT; on  Xarelto PTA - Lovenox while admitted  History of stroke, meningioma, memory deficit, depression  Diabetes -No additional monitoring at this time  CODE STATUS discussions-had  previously been DNR.  Son converted the patient back to DNR on 1/17 because he was worried at her passing away prior to him being able to get to the hospital.  Family understands her severe disease and that she is not a candidate for outpatient cancer treatment due to frailty.  CODE STATUS changed back to DNR with daughter Pamela Kennedy at bedside. The majority of her adult children have voiced wishes for her to be DNR this admission.  Best Practice (right click and "Reselect all SmartList Selections" daily)   Diet/type: NPO DVT prophylaxis: systemic dose LMWH GI prophylaxis: N/A Lines: Central line Foley:  Yes, and it is still needed Code Status:  DNR Last date of multidisciplinary goals of care discussion [ 1/18, see above]  Labs   CBC: Recent Labs  Lab 11/01/2021 1123 10/22/21 0316 10/23/21 0316 10/24/21 0404 10/25/21 0355  WBC 19.4* 20.3* 22.4* 20.3* 23.2*  NEUTROABS 17.1* 17.4*  --   --   --   HGB 12.9 11.4* 11.8* 11.3* 11.8*  HCT 43.4 38.0 38.6 37.0 37.5  MCV 65.4* 65.4* 64.7* 63.5* 62.7*  PLT 969* 846* 798* 738* 758*    Basic Metabolic Panel: Recent Labs  Lab 10/08/2021 1333 10/22/21 0316 10/23/21 0316 10/24/21 0404 10/25/21 0355  NA 127* 127* 127* 126* 129*  K 4.2 3.8 3.8 3.9 3.9  CL 87* 90* 91* 92* 93*  CO2 24 24 21* 22 21*  GLUCOSE 341* 271* 259* 236* 238*  BUN 32* 34* 39* 47* 56*  CREATININE 1.82* 1.41* 1.56* 1.55* 1.85*  CALCIUM 8.8* 8.6* 8.6* 8.3* 8.9  MG 2.5*  --   --   --   --    GFR: Estimated Creatinine Clearance: 36.5 mL/min (A) (by C-G formula based on SCr of 1.85 mg/dL (H)). Recent Labs  Lab 10/10/2021 1123 10/27/2021 2254 10/22/21 0316 10/23/21 0316 10/24/21 0404 10/25/21 0355  WBC 19.4*  --  20.3* 22.4* 20.3* 23.2*  LATICACIDVEN 3.0* 1.8  --   --   --   --     Liver Function Tests: Recent Labs  Lab 10/12/2021 1333 10/22/21 0316 10/23/21 0316 10/23/21 1254 10/24/21 0404 10/25/21 0355  AST 50* 42* 44*  --  24 20  ALT 39 32 35  --  24 18   ALKPHOS 107 97 103  --  96 92  BILITOT 0.9 0.9 1.0  --  0.8 1.0  PROT 7.4 6.5 6.5  --  6.1* 6.3*  ALBUMIN 2.6* 2.3* 2.2* 1.8* 2.1* 2.2*   Recent Labs  Lab 10/22/2021 1333  LIPASE 33   Recent Labs  Lab 10/22/21 1230  AMMONIA 26    ABG    Component Value Date/Time   PHART 7.427 10/25/2021 0845   PCO2ART 27.1 (L) 10/25/2021 0845   PO2ART 66.9 (L) 10/25/2021 0845   HCO3 17.7 (L) 10/25/2021 0845   ACIDBASEDEF 5.2 (H) 10/25/2021 0845   O2SAT 92.3 10/25/2021 0845     Coagulation Profile: Recent Labs  Lab 10/25/21 0355  INR 1.5*    Cardiac Enzymes: No results for input(s): CKTOTAL, CKMB, CKMBINDEX, TROPONINI in the last 168 hours.  HbA1C: Hgb A1c MFr Bld  Date/Time Value Ref Range Status  10/23/2021 03:50 PM 7.6 (H) 4.8 - 5.6 % Final    Comment:    (NOTE) Pre diabetes:  5.7%-6.4%  Diabetes:              >6.4%  Glycemic control for   <7.0% adults with diabetes     CBG: Recent Labs  Lab 10/24/21 0744 10/24/21 1203 10/24/21 1711 10/24/21 2102 10/25/21 0736  GLUCAP 250* 241* 253* 255* 254*    Review of Systems:   Unable to be obtained due to encephalopathy, respiratory distress  Past Medical History:  She,  has a past medical history of Depression, Diabetes mellitus without complication ([place]), Family history of breast cancer, Family history of prostate cancer, and Overactive bladder.   Surgical History:   Past Surgical History:  Procedure Laterality Date   CHOLECYSTECTOMY       Social History:   reports that she has been smoking. Her smokeless tobacco use includes snuff. She reports that she does not drink alcohol.   Family History:  Her family history includes Breast cancer in her sister; Breast cancer (age of onset: 23) in her sister; Prostate cancer (age of onset: 55) in her brother; Prostate cancer (age of onset: 60) in her father; Stroke in her paternal aunt.   Allergies No Known Allergies   Home Medications  Prior to Admission  medications   Medication Sig Start Date End Date Taking? Authorizing Provider  lovastatin (ALTOPREV) 20 MG 24 hr tablet Take 20 mg by mouth at bedtime.   Yes [provider]  megestrol (MEGACE) 40 MG/ML suspension Take 15.6 mls by mouth once daily 10/18/21  Yes Truitt Merle, MD  metFORMIN (GLUCOPHAGE-XR) 500 MG 24 hr tablet Take 500 mg by mouth 2 (two) times daily. 05/15/21  Yes [provider]  prochlorperazine (COMPAZINE) 5 MG tablet Take 1-2 tablets (5-10 mg total) by mouth every 6 (six) hours as needed for nausea or vomiting. 10/18/21  Yes Truitt Merle, MD  risperiDONE (RISPERDAL) 1 MG tablet Take 1 mg by mouth at bedtime. 07/10/21  Yes [provider]  traMADol (ULTRAM) 50 MG tablet Take 1 tablet (50 mg total) by mouth every 6 (six) hours as needed. Patient taking differently: Take 50 mg by mouth every 6 (six) hours as needed for moderate pain. 10/18/21  Yes Truitt Merle, MD  venlafaxine XR (EFFEXOR-XR) 75 MG 24 hr capsule Take 75 mg by mouth daily. 07/10/21  Yes [provider]  cephALEXin (KEFLEX) 500 MG capsule Take 1 capsule (500 mg total) by mouth 4 (four) times daily. Patient not taking: Reported on 10/22/2021 10/14/21   Regan Lemming, MD  ondansetron (ZOFRAN-ODT) 4 MG disintegrating tablet Take 1 tablet (4 mg total) by mouth every 8 (eight) hours as needed for nausea or vomiting. Patient not taking: Reported on 10/22/2021 10/14/21   Regan Lemming, MD  XARELTO 20 MG TABS tablet TAKE 1 TABLET BY MOUTH DAILY WITH EVENING MEAL Patient not taking: Reported on 10/22/2021 10/17/21   Alla Feeling, NP     Critical care time: 54 min.     [name] Hy, DO 10/25/21 10:19 AM [facility]

## 2021-10-25 NOTE — Progress Notes (Addendum)
PROGRESS NOTE  LATE ENTRY FOR 1/18  Pamela Kennedy  PYP:[id number] DOB: December 21, 1955 DOA: 10/27/2021 PCP: Pcp, No  Brief Narrative:  66 year old black female previously high functioning (was driving 3 weeks prior to admission)  known depression DM TY 2 overactive bladder DVTs on Xarelto Stage IIIb gallbladder cancer Admitted 10/09/2021 progressive weakness mental status changes over 2 weeks Admitted with a diagnosis sepsis due to undetermined and profound azotemia Because of progressive distention of the abdomen ascitic tap was performed 1/16 patient was kept on IV antibiotics and subsequently after detailed discussions  with multiple family members and daughter patient was made no CODE BLUE  Hospital-Problem based course  Acute metabolic encephalopathy DDx sepsis versus progressive liver disease Gallbladder adenocarcinoma stage IIIb Ovarian mass resected x 2 AKI DM TY 2  See my note from prior Appreciate critical care input-- impression  patient is actively dying and family have come to terms with this- agree with de-escalation of care/comfort measures and will reassess in the morning Can transfer to palliative floor   DVT prophylaxis: Discontinued as discomfort Code Status: DNR Family Communication: See prior note Disposition:  Status is: Inpatient  Remains inpatient appropriate because:  Possible hospital death       Consultants:  Critical care  Procedures: None  Antimicrobials: None at this time   Subjective: See prior note from 1/18   Family has decided ultimately on comfort measures-d/w daughter later this pm need for opioid gtt for comfort and irreversibility of current clinical scenario--she understands accepts and is grateful for this closure  Objective: Vitals:   10/25/21 1000 10/25/21 1015 10/25/21 1030 10/25/21 1100  BP: 97/70 103/67 94/67   Pulse: (!) 107 (!) 107 (!) 108 (!) 107  Resp: (!) 35 (!) 33 (!) 35 (!) 29  Temp:      TempSrc:       SpO2: 95% 95% 95% 96%  Weight:      Height:        Intake/Output Summary (Last 24 hours) at 10/25/2021 1633 Last data filed at 10/25/2021 1241 Gross per 24 hour  Intake 840 ml  Output --  Net 840 ml   Filed Weights   10/29/2021 2205  Weight: 99.3 kg    Examination:  Was lethargic and somnolent when seen Gcs dminished ~ 6-7 when reassessed on ICU transfer Abd soft distended no rebound no guard--LE bil edema is +    Data Reviewed: personally reviewed   CBC    Component Value Date/Time   WBC 23.2 (H) 10/25/2021 0355   RBC 5.98 (H) 10/25/2021 0355   HGB 11.8 (L) 10/25/2021 0355   HGB 12.1 08/21/2021 1040   HCT 37.5 10/25/2021 0355   PLT 758 (H) 10/25/2021 0355   PLT 308 08/21/2021 1040   MCV 62.7 (L) 10/25/2021 0355   MCH 19.7 (L) 10/25/2021 0355   MCHC 31.5 10/25/2021 0355   RDW 17.5 (H) 10/25/2021 0355   LYMPHSABS 1.2 10/22/2021 0316   MONOABS 1.5 (H) 10/22/2021 0316   EOSABS 0.0 10/22/2021 0316   BASOSABS 0.0 10/22/2021 0316   CMP Latest Ref Rng & Units 10/25/2021 10/24/2021 10/23/2021  Glucose 70 - 99 mg/dL 238(H) 236(H) 259(H)  BUN 8 - 23 mg/dL 56(H) 47(H) 39(H)  Creatinine 0.44 - 1.00 mg/dL 1.85(H) 1.55(H) 1.56(H)  Sodium 135 - 145 mmol/L 129(L) 126(L) 127(L)  Potassium 3.5 - 5.1 mmol/L 3.9 3.9 3.8  Chloride 98 - 111 mmol/L 93(L) 92(L) 91(L)  CO2 22 - 32 mmol/L 21(L) 22 21(L)  Calcium  8.9 - 10.3 mg/dL 8.9 8.3(L) 8.6(L)  Total Protein 6.5 - 8.1 g/dL 6.3(L) 6.1(L) 6.5  Total Bilirubin 0.3 - 1.2 mg/dL 1.0 0.8 1.0  Alkaline Phos 38 - 126 U/L 92 96 103  AST 15 - 41 U/L 20 24 44(H)  ALT 0 - 44 U/L 18 24 35     Radiology Studies: DG CHEST PORT 1 VIEW  Result Date: 10/25/2021 CLINICAL DATA:  Altered mental status, sepsis. EXAM: PORTABLE CHEST 1 VIEW COMPARISON:  Chest x-ray dated October 21, 2021 FINDINGS: Cardiac and mediastinal contours are unchanged. Interval placement of right arm PICC with tip positioned near the expected area of the superior cavoatrial  junction. Low lung volumes with hypoventilatory changes. Increased mild right lower lung opacity. No large pleural effusion or pneumothorax. IMPRESSION: 1. Low lung volumes with hypoventilatory changes. Increased mild right lower lung opacity, likely due to atelectasis. 2. Interval placement of right arm PICC with tip positioned near the expected area of the superior cavoatrial junction. Electronically Signed   By: Yetta Glassman M.D.   On: 10/25/2021 10:15     Scheduled Meds:  chlorhexidine  15 mL Mouth Rinse BID   Chlorhexidine Gluconate Cloth  6 each Topical Daily   enoxaparin (LOVENOX) injection  40 mg Subcutaneous Q24H   furosemide  60 mg Intravenous Once   lactulose  20 g Oral BID   mouth rinse  15 mL Mouth Rinse BID   mouth rinse  15 mL Mouth Rinse q12n4p   polyethylene glycol  17 g Oral Daily   senna-docusate  1 tablet Oral BID   sodium chloride flush  10-40 mL Intracatheter Q12H   Continuous Infusions:   LOS: 4 days   Time spent: 70 total minutes in  coordination time  Nita Sells, MD [facility] To contact the attending provider between 7A-7P or the covering provider during after hours 7P-7A, please log into the web site [email or web address] and access using universal [place] password for that web site. If you do not have the password, please call the hospital operator.  10/25/2021, 4:33 PM

## 2021-10-25 NOTE — Progress Notes (Signed)
Full note to follow  Called to the bedside about patient having mental status change, nausea vomiting hypotension and tachycardia Rapid response nurse present at the time On exam patient tachypneic to the 40s, tense ascites and lethargic Was not focusing on me when I examined her  Discussed with multiple family members at the bedside discussions that had been undertaken by prior physician It appears patient had been DNR but did not to be made full code  Discussed with another family member, Vito Backers who is in nursing education--she shared with me that patient had a prior hospitalization for about a month where patient had tube feeds and that she had recovered from that  Central tells me that patient was driving 3 weeks ago and was functional  When I went back to the room-office as a blood gas was being done patient was more alert and coherent.  We currently await her brother driving in from [place] to determine next steps in terms of CODE STATUS discussions  We will consult critical care and transfer to stepdown as I feel that although we do not Unifying diagnosis, her body is failing her and she is looking worse since December according to my and Cassandra's discussion   > 40 minutes critical care time

## 2021-10-25 NOTE — Progress Notes (Signed)
°   10/25/21 0819  Assess: if the MEWS score is Yellow or Red  Were vital signs taken at a resting state? Yes  Focused Assessment Change from prior assessment (see assessment flowsheet)  Does the patient meet 2 or more of the SIRS criteria? Yes  Does the patient have a confirmed or suspected source of infection? Yes  Provider and Rapid Response Notified? Yes  MEWS guidelines implemented *See Row Information* No, previously red, continue vital signs every 4 hours  Take Vital Signs  Increase Vital Sign Frequency  Red: Q 1hr X 4 then Q 4hr X 4, if remains red, continue Q 4hrs  Escalate  MEWS: Escalate Red: discuss with charge nurse/RN and provider, consider discussing with RRT  Notify: Charge Nurse/RN  Name of Charge Nurse/RN Notified Marissa L RN  Date Charge Nurse/RN Notified 10/25/21  Time Charge Nurse/RN Notified 0805  Notify: Provider  Provider Name/Title Dr Verlon Au  Date Provider Notified 10/25/21  Time Provider Notified [phone]  Notification Type Page  Notification Reason Change in status  Provider response At bedside  Date of Provider Response 10/25/21  Time of Provider Response 0819  Notify: Rapid Response  Name of Rapid Response RN Notified Royston Cowper RN  Date Rapid Response Notified 10/25/21  Time Rapid Response Notified 0819

## 2021-10-25 NOTE — Plan of Care (Signed)
  Problem: Education: Goal: Knowledge of General Education information will improve Description Including pain rating scale, medication(s)/side effects and non-pharmacologic comfort measures Outcome: Progressing   

## 2021-10-25 NOTE — Evaluation (Signed)
SLP Cancellation Note  Patient Details Name: Pamela Kennedy MRN: [id number] DOB: 1955-12-14   Cancelled treatment:       Reason Eval/Treat Not Completed: Other (comment) (pt is npo for surgery at this time, will continue efforts)  Kathleen Lime, MS [facility] SLP [place] Office [phone] Cell [phone]   Macario Golds 10/25/2021, 8:31 AM

## 2021-10-25 NOTE — Progress Notes (Signed)
Visited with family at bedside.  Support offered to brothers and sisters.  They report the patients children left to pick up children.  Called patient's son Deirdre Evener to offer support.  Explained patients clinical status, state of cancer to include disease progress and not a candidate for further chemotherapy, we discussed the difference between palliative care, comfort care / hospice.  He indicates he wanted to keep his mother a full code to give him and the family time to get to the hospital because he did not want her to die alone.  I commended him on his love and support for his mother.  We reviewed that in her case we have reached the limit of what modern medicine can provide and that as her providers we will shift our focus to ensuring her comfort - treating symptoms such as shortness of breath, pain and anxiety (given we can not cure the underlying disease state).  Support offered to him. He indicates understanding of the situation and that we will not do things at the end (intubation, CPR, shock) that would not change her outcome.  He states he does not want her to suffer.      Noe Gens, MSN, APRN, NP-C, AGACNP-BC [facility] 10/25/2021, 3:03 PM   Please see [email or web address] for pager details.   From 7A-7P if no response, please call [phone] After hours, please call ELink [phone]

## 2021-10-25 NOTE — TOC Progression Note (Signed)
Transition of Care [facility]) - Progression Note    Patient Details  Name: Pamela Kennedy MRN: [id number] Date of Birth: March 19, 1956  [facility]) CM/SW Contact  Tahra Hitzeman, Juliann Pulse, RN Phone Number: 10/25/2021, 10:07 AM  Clinical Narrative: Noted transferred to ICU stepdown, will continue to follow.      Expected Discharge Plan:  (TBD) Barriers to Discharge: Continued Medical Work up  Expected Discharge Plan and Services Expected Discharge Plan:  (TBD)                                               Social Determinants of Health (SDOH) Interventions    Readmission Risk Interventions Readmission Risk Prevention Plan 10/24/2021  Transportation Screening Complete  PCP or Specialist Appt within 3-5 Days Complete  HRI or [place] Complete  Social Work Consult for [place] Planning/Counseling Complete  Palliative Care Screening Complete  Medication Review Press photographer) Complete

## 2021-10-25 NOTE — Progress Notes (Signed)
Pharmacy Antibiotic Note  Pamela Kennedy is a 66 y.o. female admitted on 11/06/2021 with sepsis.  Pharmacy has been consulted for Cefepime dosing, Flagyl per MD   Plan: Cefepime 2gm q12 Flagyl 500mg  IV q12 Follow up renal function, culture results, and clinical course.   Height: 5\' 6"  (167.6 cm) Weight: 99.3 kg (218 lb 14.4 oz) IBW/kg (Calculated) : 59.3  Temp (24hrs), Avg:97.7 F (36.5 C), Min:97.5 F (36.4 C), Max:98 F (36.7 C)  Recent Labs  Lab 10/31/2021 1123 10/22/2021 1333 11/07/2021 2254 10/22/21 0316 10/23/21 0316 10/24/21 0404 10/25/21 0355  WBC 19.4*  --   --  20.3* 22.4* 20.3* 23.2*  CREATININE  --  1.82*  --  1.41* 1.56* 1.55* 1.85*  LATICACIDVEN 3.0*  --  1.8  --   --   --   --      Estimated Creatinine Clearance: 36.5 mL/min (A) (by C-G formula based on SCr of 1.85 mg/dL (H)).    No Known Allergies  Antimicrobials this admission: 1/15 Cefepime >>  1/15 Vancomycin >> 1/17 1/15 Flagyl >>  Dose adjustments this admission:  Microbiology results: 1/14 BCx: NGTD 1/16 Peritoneal fluid:  ngtd   Thank you for allowing pharmacy to be a part of this patients care.  Gretta Arab PharmD, BCPS Clinical Pharmacist [facility] main pharmacy [phone] 10/25/2021 9:36 AM

## 2021-10-25 NOTE — Progress Notes (Signed)
I met with Pamela Kennedy, her daughter Pamela Kennedy, Dr. Verlon Au, and her nurse.  We discussed that she is critically ill with high likelihood to decline today.  CODE STATUS has been reversed yesterday to facilitate his Mencer son arriving from [place].  He should be about 30 minutes away.  We discussed that resuscitation measures would be unlikely to reverse her disease process and would not likely improve her quality or quantity of life.  CODE STATUS was discussed again. She would favor keeping her mom comfortable with her final time. In the short term before her brother arrives we would not put her on the ventilator or do chest compressions.  We would rather focus this time on calling her brother to give him a few final moments to talk to her on speaker phone.  The majority of the adult children have made this decision over the last few days that she should be DNR.  CODE STATUS updated in the chart. Once her son arrives we will administer medications for air hunger.  [name] Hy, DO 10/25/21 10:08 AM [facility]

## 2021-10-26 DIAGNOSIS — A419 Sepsis, unspecified organism: Secondary | ICD-10-CM | POA: Diagnosis not present

## 2021-10-26 LAB — CULTURE, BLOOD (ROUTINE X 2)
Culture: NO GROWTH
Culture: NO GROWTH
Special Requests: ADEQUATE

## 2021-10-27 LAB — BODY FLUID CULTURE W GRAM STAIN
Culture: NO GROWTH
Gram Stain: NONE SEEN

## 2021-11-01 ENCOUNTER — Other Ambulatory Visit (HOSPITAL_COMMUNITY): Payer: Self-pay

## 2021-11-08 NOTE — TOC Progression Note (Signed)
Transition of Care [facility]) - Progression Note    Patient Details  Name: Pamela Kennedy MRN: [id number] Date of Birth: 1956/09/03  [facility]) CM/SW Contact  Khaza Blansett, Marjie Skiff, RN Phone Number: November 19, 2021, 2:24 PM  Clinical Narrative:     This CM was asked by Joslyn Devon from PMT to inquire about [facility] bed for pt. Referral sent to [facility]. TOC will continue to follow.  Expected Discharge Plan:  (TBD) Barriers to Discharge: Continued Medical Work up  Expected Discharge Plan and Services Expected Discharge Plan:  (TBD)        Social Determinants of Health (SDOH) Interventions    Readmission Risk Interventions Readmission Risk Prevention Plan 10/24/2021  Transportation Screening Complete  PCP or Specialist Appt within 3-5 Days Complete  HRI or [place] Complete  Social Work Consult for [place] Planning/Counseling Complete  Palliative Care Screening Complete  Medication Review Press photographer) Complete

## 2021-11-08 NOTE — Death Summary Note (Signed)
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 Admitted 10/23/2021 progressive weakness mental status changes over 2 weeks Admitted with a diagnosis sepsis due to undetermined and profound azotemia Because of progressive distention of the abdomen ascitic tap was performed 1/16 patient was kept on IV antibiotics]  Patient was treated for sepsis on admission secondary to presumed UTI and cefepime vancomycin and flagyl initially Paracentesis tap was negative for infection and no malignant cells were found  Metabolic encephalopathy was likely secondary to multiple reasons as ammonia was normal  Primary gallbladder adenocarcinoma Unspecified ovarian mass concerning for metastatic process Profound ascites Patient was seen by oncology for her multiple cancer related concerns-it was felt that ultimately she had a very poor prognosis given the rapidity of her decline-there were plans in place for IR to biopsy this to obtain a diagnosis however this could not be obtained because of her hemodynamic instability  There was some vacillation during hospitalization regarding CODE STATUS-patient had 9 brothers and sisters and were large connected family Ultimately patient was made no CODE BLUE    Final Diagnoses:  Acute metabolic encephalopathy DDx sepsis +Gallbladder adenocarcinoma stage IIIb Ovarian mass resected x 2 AKI DM TY 2 Hypervolemic hyponatremia secondary to cirrhosis Thrombocytosis History of blood clots in setting of cancer Uncontrolled DM TY 2   The results of significant diagnostics from this  hospitalization (including imaging, microbiology, ancillary and laboratory) are listed below for reference.    Significant Diagnostic Studies: CT ABDOMEN PELVIS W CONTRAST  Result Date: 10/13/2021 CLINICAL DATA:  Abdominal distension.  History of biliary carcinoma. EXAM: CT ABDOMEN AND PELVIS WITH CONTRAST TECHNIQUE: Multidetector CT imaging of the abdomen and pelvis was performed using the standard protocol following bolus administration of intravenous contrast. CONTRAST:  154mL OMNIPAQUE IOHEXOL 350 MG/ML SOLN COMPARISON:  CT abdomen pelvis dated 05/18/2021. FINDINGS: Lower chest: The visualized lung bases are clear. No intra-abdominal free air.  Small ascites, new since the prior CT. Hepatobiliary: Apparent fatty infiltration of the liver. There is mild biliary ductal dilatation. Cholecystectomy. Pancreas: Unremarkable. No pancreatic ductal dilatation or surrounding inflammatory changes. Spleen: Normal in size without focal abnormality. Adrenals/Urinary Tract: The adrenal glands unremarkable. There is no hydronephrosis on either side. The urinary bladder is grossly unremarkable. Stomach/Bowel: There is no bowel obstruction. The appendix is unremarkable as visualized. Vascular/Lymphatic: The abdominal aorta and IVC are unremarkable. No portal venous gas. Reproductive: The uterus is anteverted. Small uterine calcifications, likely calcified fibroids. There is a 4.5 x 4.5 cm mass posterior to the uterus, new since the prior CT. Additionally there is a large mass in the right lower abdomen contiguous with the right adnexa measuring approximately 13 x 16 x 13 cm. This mass demonstrates heterogeneous enhancement with predominantly lower areas of enhancement centrally. These masses are most consistent with malignancy, possibly ovarian in origin. Further characterization with MRI without and with contrast as well as sampling of the ascitic fluid may provide additional diagnostic value. Other: None Musculoskeletal:  Degenerative changes versus Schmorl's node in the superior endplate of the L3. No acute osseous pathology. IMPRESSION: 1. Large right lower quadrant and pelvic masses most consistent with malignancy, possibly ovarian  in origin. 2. Small ascites, new since the prior CT. 3. No bowel obstruction. Normal appendix. Electronically Signed   By: Anner Crete M.D.   On: 10/13/2021 23:23   US Paracentesis  Result Date: 10/23/2021 INDICATION: Ascites EXAM: ULTRASOUND GUIDED RLQ PARACENTESIS MEDICATIONS: None. COMPLICATIONS: None immediate. PROCEDURE: Informed written consent was obtained from the patient after a discussion of the risks, benefits and alternatives to treatment. A timeout was performed prior to the initiation of the procedure. Initial ultrasound scanning demonstrates a large amount of ascites within the right lower abdominal quadrant. The right lower abdomen was prepped and draped in the usual sterile fashion. 1% lidocaine was used for local anesthesia. Following this, a 19 gauge, 10-cm, Yueh catheter was introduced. An ultrasound image was saved for documentation purposes. The paracentesis was performed. The catheter was removed and a dressing was applied. The patient tolerated the procedure well without immediate post procedural complication. FINDINGS: A total of approximately 1.3 L of yellow fluid was removed. Samples were sent to the laboratory as requested by the clinical team. IMPRESSION: Successful ultrasound-guided paracentesis yielding 1.3 liters of peritoneal fluid. Read and performed by: Alexandria Lodge, PA-C Electronically Signed   By: Jacqulynn Cadet M.D.   On: 10/23/2021 15:29   DG CHEST PORT 1 VIEW  Result Date: 10/25/2021 CLINICAL DATA:  Altered mental status, sepsis. EXAM: PORTABLE CHEST 1 VIEW COMPARISON:  Chest x-ray dated October 21, 2021 FINDINGS: Cardiac and mediastinal contours are unchanged. Interval placement of right arm PICC with tip positioned near the expected area of  the superior cavoatrial junction. Low lung volumes with hypoventilatory changes. Increased mild right lower lung opacity. No large pleural effusion or pneumothorax. IMPRESSION: 1. Low lung volumes with hypoventilatory changes. Increased mild right lower lung opacity, likely due to atelectasis. 2. Interval placement of right arm PICC with tip positioned near the expected area of the superior cavoatrial junction. Electronically Signed   By: Yetta Glassman M.D.   On: 10/25/2021 10:15   DG Chest Portable 1 View  Result Date: 10/10/2021 CLINICAL DATA:  66 year old female with history of fatigue. EXAM: PORTABLE CHEST 1 VIEW COMPARISON:  No priors. FINDINGS: Lung volumes are low. No consolidative airspace disease. No pleural effusions. No pneumothorax. No pulmonary nodule or mass noted. Pulmonary vasculature and the cardiomediastinal silhouette are within normal limits. IMPRESSION: 1. Low lung volumes without radiographic evidence of acute cardiopulmonary disease. Electronically Signed   By: Vinnie Langton M.D.   On: 10/14/2021 11:43   [place] EKG SITE RITE  Result Date: 10/20/2021 If Site Rite image not attached, placement could not be confirmed due to current cardiac rhythm.   Microbiology: Recent Results (from the past 240 hour(s))  Blood culture (routine x 2)     Status: None   Collection Time: 10/31/2021 11:23 AM   Specimen: BLOOD  Result Value Ref Range Status   Specimen Description   Final    BLOOD BLOOD LEFT HAND Performed at [facility] [address], [place], [place] [address]    Special Requests   Final    BOTTLES DRAWN AEROBIC AND ANAEROBIC Blood Culture adequate volume Performed at [facility] [address], [place], [place] [address]    Culture   Final    NO GROWTH 5 DAYS Performed at [facility], [place] [address], [place], [place] [address]    Report Status November 05, 2021 FINAL  Final  Blood culture (routine x 2)     Status: None  Collection Time: 11/02/2021 11:28 AM   Specimen: BLOOD  Result Value Ref Range Status   Specimen Description   Final    BLOOD LEFT ANTECUBITAL Performed at [facility] [address] [place], [place] [address]    Special Requests   Final    BOTTLES DRAWN AEROBIC AND ANAEROBIC Blood Culture results may not be optimal due to an inadequate volume of blood received in culture bottles Performed at [facility] [address], [place], [place] [address]    Culture   Final    NO GROWTH 5 DAYS Performed at [facility], [place] [address], [place], [place] [address]    Report Status 11-19-2021 FINAL  Final  Body fluid culture w Gram Stain     Status: None (Preliminary result)   Collection Time: 10/23/21  3:13 PM   Specimen: PATH Cytology Peritoneal fluid  Result Value Ref Range Status   Specimen Description   Final    PERITONEAL Performed at [facility] [address] [place], [place] [address]    Special Requests   Final    NONE Performed at [facility], [place] [address], [place], [place] [address]    Gram Stain   Final    NO SQUAMOUS EPITHELIAL CELLS SEEN NO WBC SEEN NO ORGANISMS SEEN    Culture   Final    NO GROWTH 3 DAYS Performed at [facility], [place] [address], [place], [place] [address]    Report Status PENDING  Incomplete  MRSA Next Gen by PCR, Nasal     Status: None   Collection Time: 10/25/21  9:28 AM   Specimen: Nasal Mucosa; Nasal Swab  Result Value Ref Range Status   MRSA by PCR Next Gen NOT DETECTED NOT DETECTED Final    Comment: (NOTE) The GeneXpert MRSA Assay (FDA approved for NASAL specimens only), is one component of a comprehensive MRSA colonization surveillance program. It is not intended to diagnose MRSA infection nor to guide or monitor treatment for MRSA infections. Test performance is not FDA approved in patients less than 38 years old. Performed at [facility], [place] [address] [place],  [address]      Labs: Basic Metabolic Panel: Recent Labs  Lab 10/24/2021 1333 10/22/21 0316 10/23/21 0316 10/24/21 0404 10/25/21 0355  NA 127* 127* 127* 126* 129*  K 4.2 3.8 3.8 3.9 3.9  CL 87* 90* 91* 92* 93*  CO2 24 24 21* 22 21*  GLUCOSE 341* 271* 259* 236* 238*  BUN 32* 34* 39* 47* 56*  CREATININE 1.82* 1.41* 1.56* 1.55* 1.85*  CALCIUM 8.8* 8.6* 8.6* 8.3* 8.9  MG 2.5*  --   --   --   --    Liver Function Tests: Recent Labs  Lab 10/20/2021 1333 10/22/21 0316 10/23/21 0316 10/23/21 1254 10/24/21 0404 10/25/21 0355  AST 50* 42* 44*  --  24 20  ALT 39 32 35  --  24 18  ALKPHOS 107 97 103  --  96 92  BILITOT 0.9 0.9 1.0  --  0.8 1.0  PROT 7.4 6.5 6.5  --  6.1* 6.3*  ALBUMIN 2.6* 2.3* 2.2* 1.8* 2.1* 2.2*   Recent Labs  Lab 11/02/2021 1333  LIPASE 33   Recent Labs  Lab 10/22/21 1230 10/25/21 1016  AMMONIA 26 31   CBC: Recent Labs  Lab 10/30/2021 1123 10/22/21 0316 10/23/21 0316 10/24/21 0404 10/25/21 0355  WBC 19.4* 20.3*  22.4* 20.3* 23.2*  NEUTROABS 17.1* 17.4*  --   --   --   HGB 12.9 11.4* 11.8* 11.3* 11.8*  HCT 43.4 38.0 38.6 37.0 37.5  MCV 65.4* 65.4* 64.7* 63.5* 62.7*  PLT 969* 846* 798* 738* 758*   Cardiac Enzymes: No results for input(s): CKTOTAL, CKMB, CKMBINDEX, TROPONINI in the last 168 hours. D-Dimer No results for input(s): DDIMER in the last 72 hours. BNP: Invalid input(s): POCBNP CBG: Recent Labs  Lab 10/24/21 0744 10/24/21 1203 10/24/21 1711 10/24/21 2102 10/25/21 0736  GLUCAP 250* 241* 253* 255* 254*   Anemia work up No results for input(s): VITAMINB12, FOLATE, FERRITIN, TIBC, IRON, RETICCTPCT in the last 72 hours. Urinalysis    Component Value Date/Time   COLORURINE AMBER (A) 10/13/2021 1450   APPEARANCEUR CLOUDY (A) 11/07/2021 1450   LABSPEC 1.027 10/08/2021 1450   PHURINE 5.0 10/20/2021 1450   GLUCOSEU 50 (A) 10/09/2021 1450   HGBUR NEGATIVE 11/03/2021 1450    BILIRUBINUR NEGATIVE 10/30/2021 1450   KETONESUR 5 (A) 11/01/2021 1450   PROTEINUR 100 (A) 11/03/2021 1450   NITRITE NEGATIVE 11/02/2021 1450   LEUKOCYTESUR MODERATE (A) 11/03/2021 1450   Sepsis Labs Invalid input(s): PROCALCITONIN,  WBC,  LACTICIDVEN     SIGNED:  Nita Sells, MD  [facility] 2021-11-03, 2:17 PM Pager   If 7PM-7AM, please contact night-coverage [email or web address] Password [email or web address]

## 2021-11-08 NOTE — Plan of Care (Signed)
  Problem: Pain Managment: Goal: General experience of comfort will improve Outcome: Progressing   

## 2021-11-08 NOTE — Progress Notes (Signed)
Manufacturing engineer [facility]) Hospital Liaison Note  Referral received for interest in [facility]. Chart under review by [facility] physician. [place] liaison will continue to follow. Please call with any questions or concerns.    Buck Mam [facility] Liaison [phone]

## 2021-11-08 NOTE — Consult Note (Signed)
Met with Ms. Frey's family regarding goals of care and support as patient appears to have entered the dying process.  Patient is resting quietly in bed with hydromorphone gtt at 0.31m/hr infusing.  Respiratory effort at 12 with light rails audible.  Family conference occurred in the unit conference room.  Each family member was able to express their feelings.  Large loving family.. Offered therapeutic listening and assurance that Ms. Bassford will be supported by PMT to provide comfort and prevent suffering.  Wishes of the family would be for an inpatient hospice transfer.  I left the family in the consult area to call CSW regarding the possibility of inpatient transfer. Before returning to the family I went into the patient's room.  Immediately noticed periods of apnea and shallow respiratory effort.  This was a change within the hour in condition.  I stayed at the bedside and called staff to have the family return to the room.  Ms. AGrilliotwas peaceful when she expired at 1410, no breath sounds or heart rate audible.  Family arrived to room, support offered.  They gathered around her bed and offered support to one another.  Dr. SVerlon Auhappened to arrive and he offered condolences to the family.    SKizzie Fantasia MSN ,RN-BC, [facility] Palliative Clinical Specialist [facility]

## 2021-11-08 DEATH — deceased

## 2021-12-25 ENCOUNTER — Other Ambulatory Visit: Payer: Medicare Other

## 2021-12-27 ENCOUNTER — Ambulatory Visit: Payer: Medicare Other | Admitting: Hematology

## 2022-01-11 ENCOUNTER — Encounter: Payer: Medicare Other | Admitting: Psychology

## 2022-01-17 ENCOUNTER — Encounter: Payer: Medicare Other | Admitting: Psychology

## 2022-03-08 ENCOUNTER — Ambulatory Visit: Payer: Medicare Other | Admitting: Neurology

## 2022-06-12 ENCOUNTER — Other Ambulatory Visit (HOSPITAL_COMMUNITY): Payer: Self-pay

## 2022-06-18 IMAGING — CT CT CHEST-ABD-PELV W/O CM
3 of 11 series · 12 of 46 positions shown, 18 images · IV contrast (APPLIED)
Comparison: None available

CLINICAL DATA: Hepatic biliary carcinoma. Rule out cancer
recurrence. Gallbladder carcinoma. Chemotherapy ongoing.

EXAM:
CT CHEST, ABDOMEN AND PELVIS WITHOUT CONTRAST
TECHNIQUE: Multidetector CT imaging of the chest, abdomen and pelvis was
performed following the standard protocol without IV contrast.
Exam ordered as IV contrast hepatic protocol; however, the IV
contrast infiltrated into the RIGHT arm. Multiple attempts were made
to reestablish IV access for contrast administration. Subsequent
attempts were unsuccessful. Exam was performed without IV contrast

[Series 2: axial arterial · axial · arterial · 0.73mm/px · z∈[-50,+199]mm · 6 of 150 slices shown]
[im 17/150  soft-tissue]
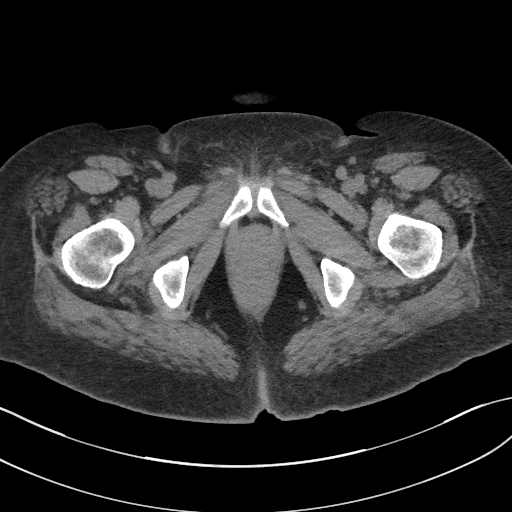
[im 34/150  soft-tissue]
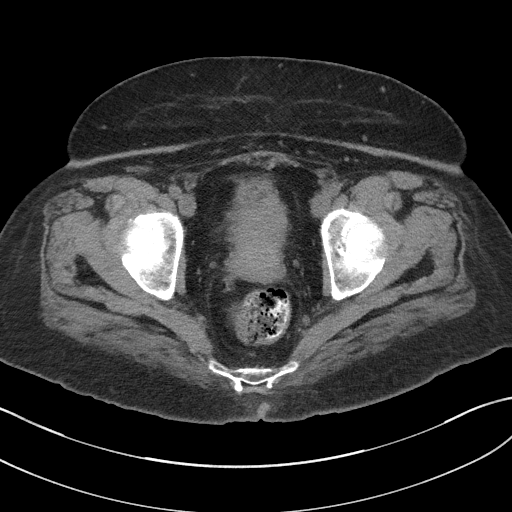
[im 50/150  soft-tissue]
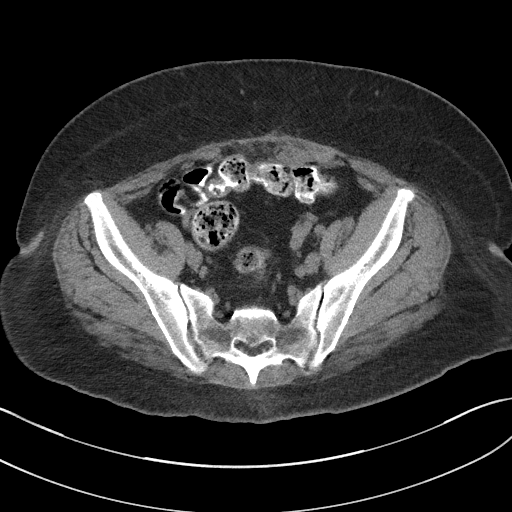
[im 67/150  soft-tissue]
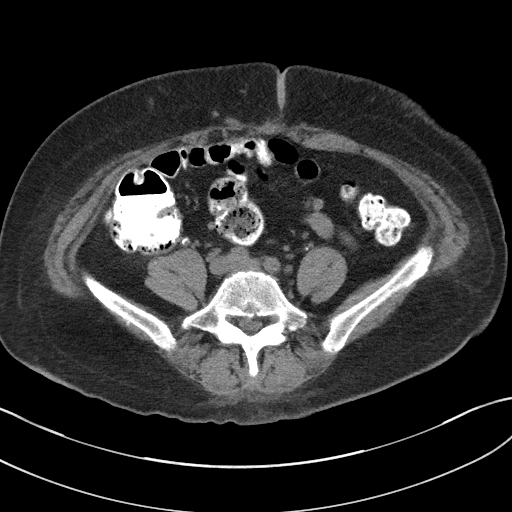
[im 83/150  soft-tissue]
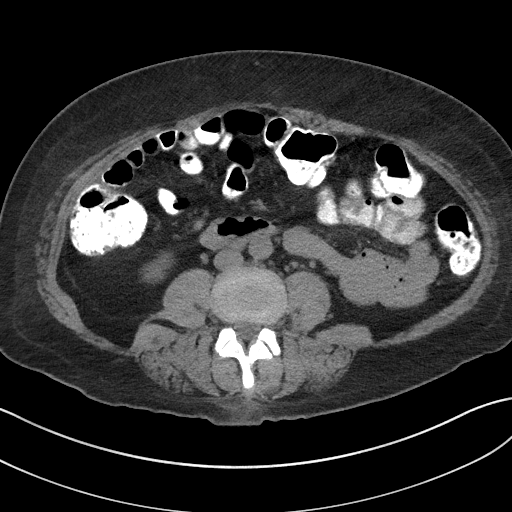
[im 100/150  soft-tissue]
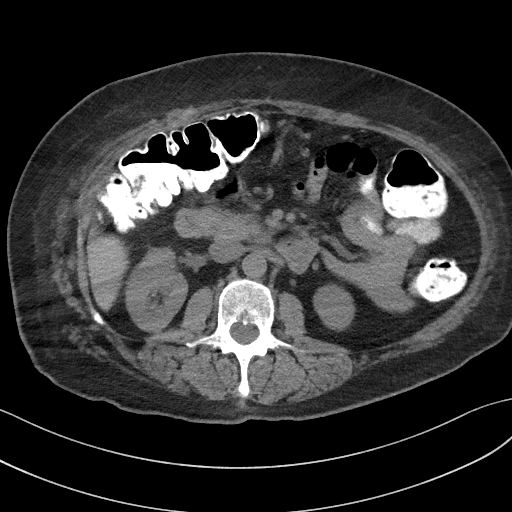

[Series 5: axial venous · axial · portal-venous · 0.68mm/px · z∈[+285,+456]mm · 4 of 96 slices shown, 9 images]
[im 20/96  soft-tissue]
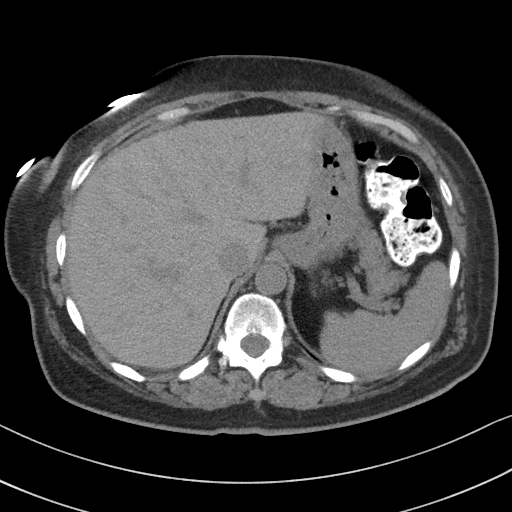
[im 20/96  lung]
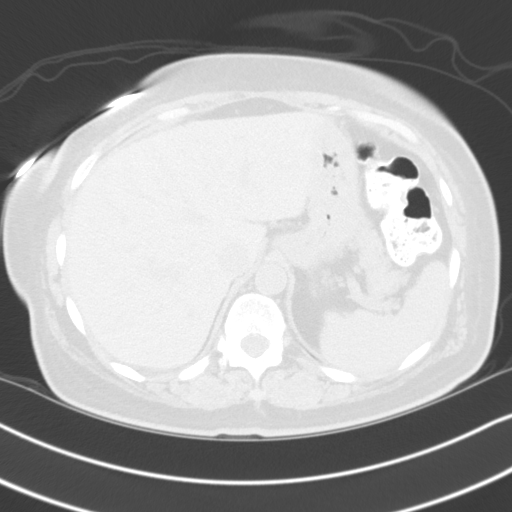
[im 20/96  bone]
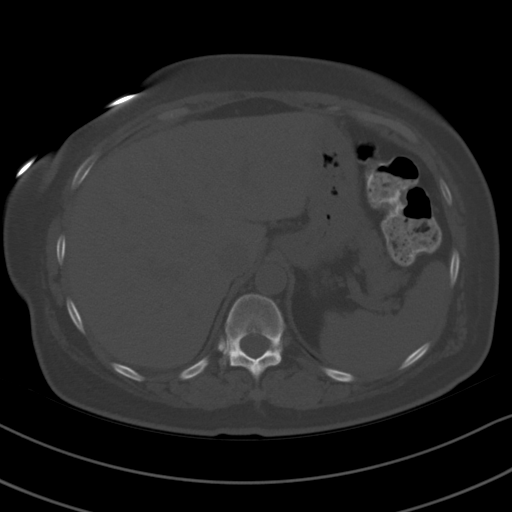
[im 39/96  soft-tissue]
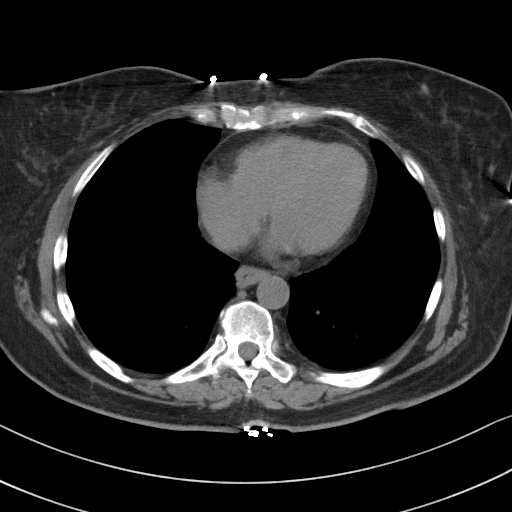
[im 39/96  lung]
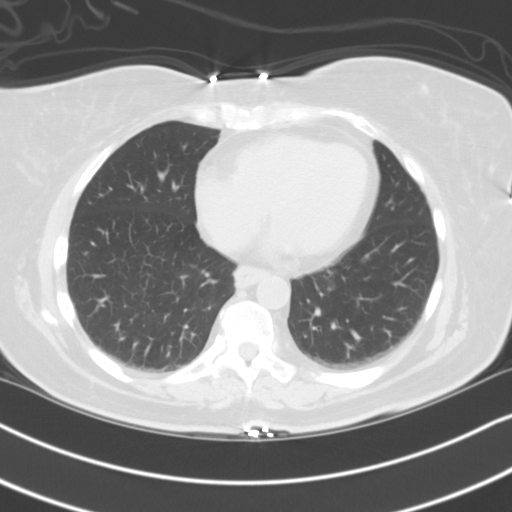
[im 58/96  soft-tissue]
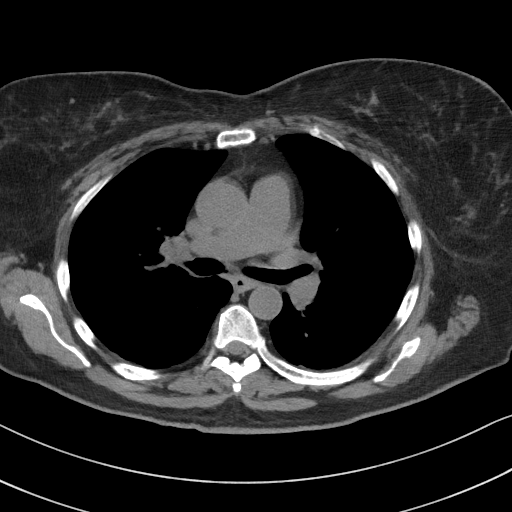
[im 58/96  lung]
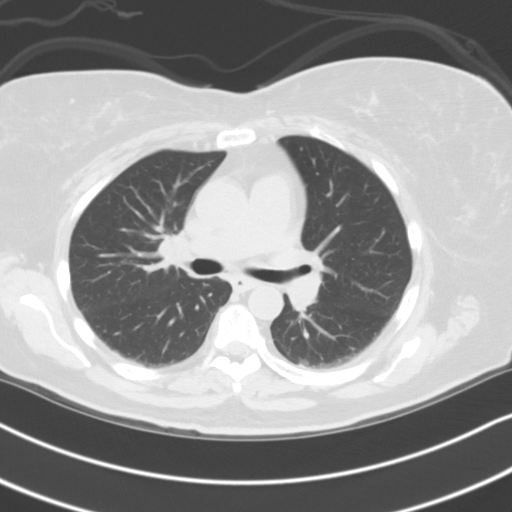
[im 77/96  soft-tissue]
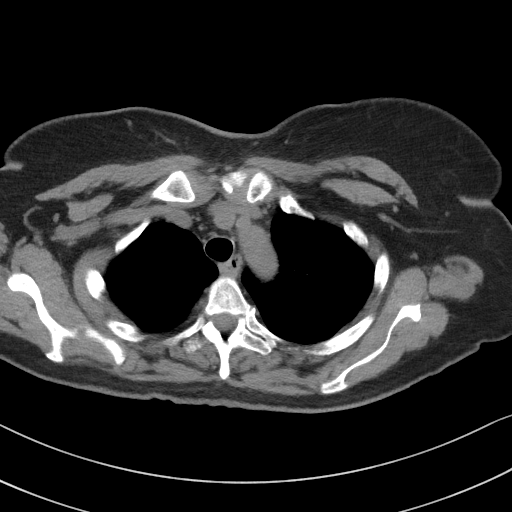
[im 77/96  lung]
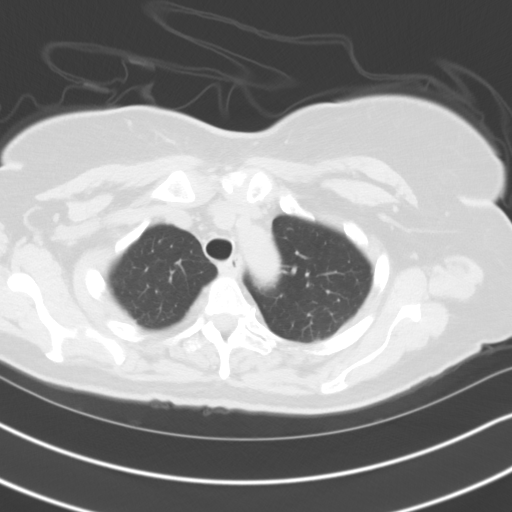

[Series 11: coronal venous · coronal · portal-venous · 0.58mm/px · 2 of 90 slices shown, 3 images]
[im 30/90  soft-tissue]
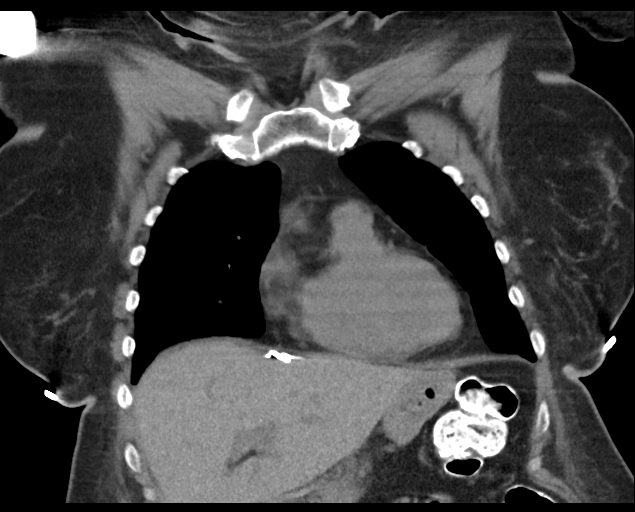
[im 30/90  bone]
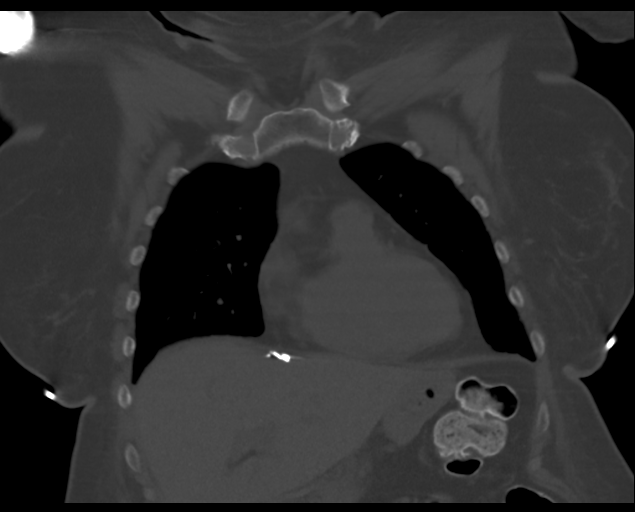
[im 60/90  soft-tissue]
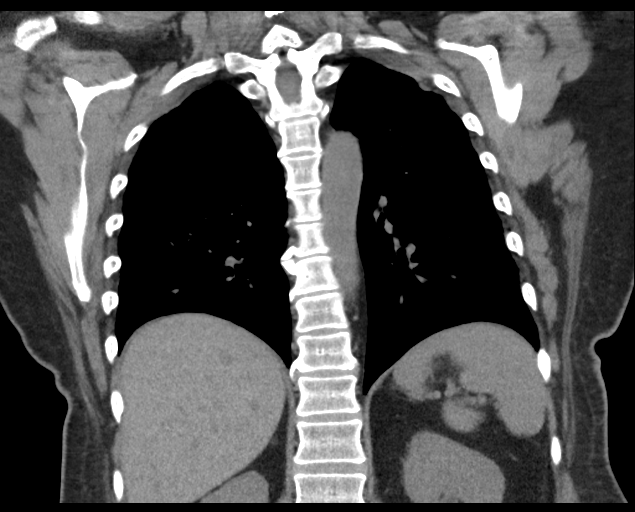

[12 of 46 positions shown; findings below may reference images not displayed]

FINDINGS: CT CHEST FINDINGS

Cardiovascular: Coronary artery calcification and aortic
atherosclerotic calcification.

Mediastinum/Nodes: No axillary or supraclavicular adenopathy. No
mediastinal or hilar adenopathy. No pericardial fluid. Esophagus
normal.

Lungs/Pleura: No suspicious pulmonary nodules.  Airways normal.

Musculoskeletal: Degenerative osteophytosis of the spine.

CT ABDOMEN AND PELVIS FINDINGS

Hepatobiliary: No focal hepatic lesion on noncontrast exam.
Postcholecystectomy anatomy. No adenopathy in the porta hepatis.

Pancreas: Pancreas is normal. No ductal dilatation. No pancreatic
inflammation.

Spleen: Normal spleen

Adrenals/urinary tract: Adrenal glands and kidneys are normal. The
ureters and bladder normal.

Stomach/Bowel: Stomach, small bowel, appendix, and cecum are normal.
The colon and rectosigmoid colon are normal.

Vascular/Lymphatic: Abdominal aorta is normal caliber. There is no
retroperitoneal or periportal lymphadenopathy. No pelvic
lymphadenopathy.

Reproductive: Calcified leiomyoma of the uterus. No adnexal
abnormality.

Other: No peritoneal metastasis. Postsurgical change along the RIGHT
abdominal wall.

Musculoskeletal: No aggressive osseous lesion.
IMPRESSION: Chest Impression:

1. No evidence of thoracic metastasis.
2. Coronary artery calcification and Aortic Atherosclerosis
([id number]).

Abdomen / Pelvis Impression:

1. No evidence of local recurrence of gallbladder carcinoma. No IV
contrast was administered as described in the technique section.
2. No adenopathy in the abdomen pelvis.  No peritoneal metastasis.
3. No skeletal metastasis identified.

## 2022-08-31 ENCOUNTER — Other Ambulatory Visit (HOSPITAL_COMMUNITY): Payer: Self-pay

## 2022-11-13 IMAGING — CT CT ABD-PELV W/ CM
2 of 5 series · 16 of 46 positions shown, 18 images · IV contrast (omnipaque)
Comparison: CT abdomen pelvis dated 05/18/2021.

CLINICAL DATA: Abdominal distension.  History of biliary carcinoma.

EXAM:
CT ABDOMEN AND PELVIS WITH CONTRAST
TECHNIQUE: Multidetector CT imaging of the abdomen and pelvis was performed
using the standard protocol following bolus administration of
intravenous contrast.
CONTRAST:  100mL OMNIPAQUE IOHEXOL 350 MG/ML SOLN

[Series 3: a/p w/ 5mm · axial · 0.95mm/px · z∈[+746,+1221]mm · 13 of 107 slices shown, 15 images]
[im 6/107  soft-tissue]
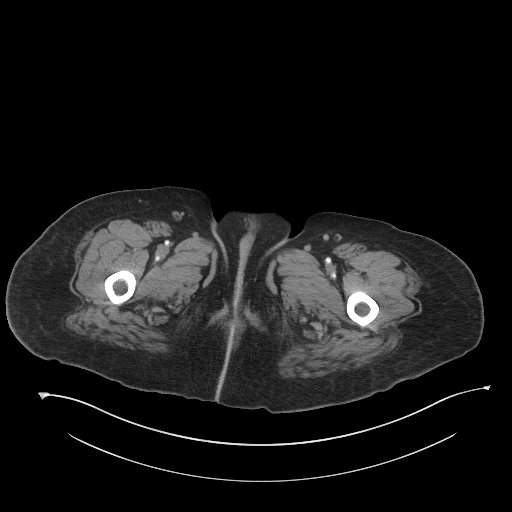
[im 6/107  bone]
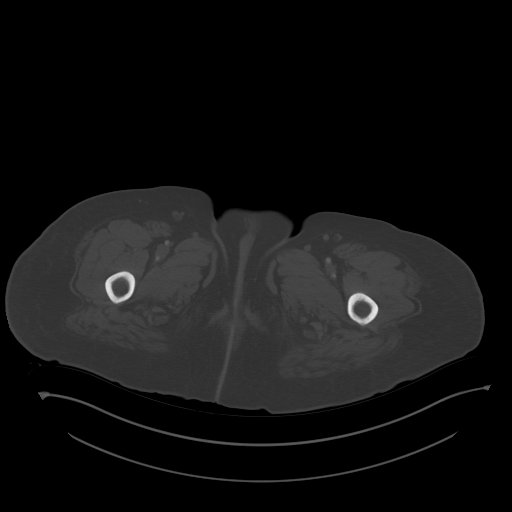
[im 16/107  soft-tissue]
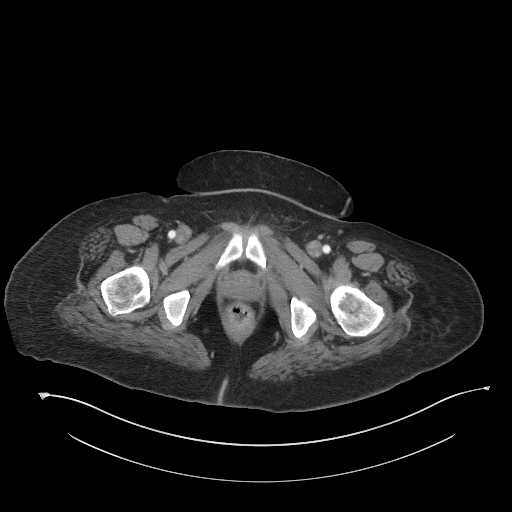
[im 21/107  soft-tissue]
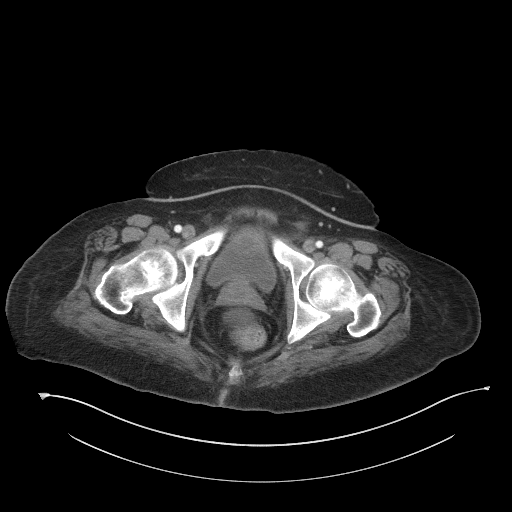
[im 31/107  soft-tissue]
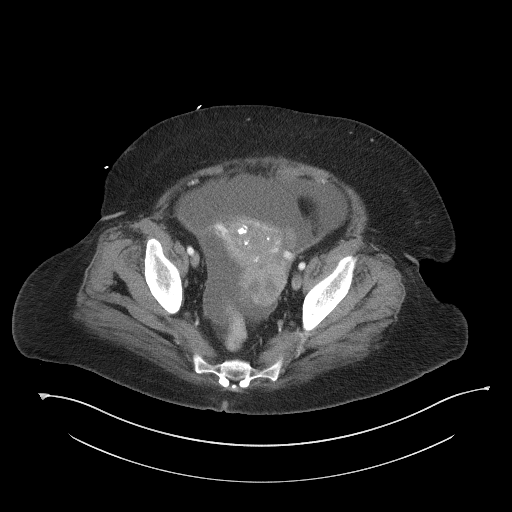
[im 36/107  soft-tissue]
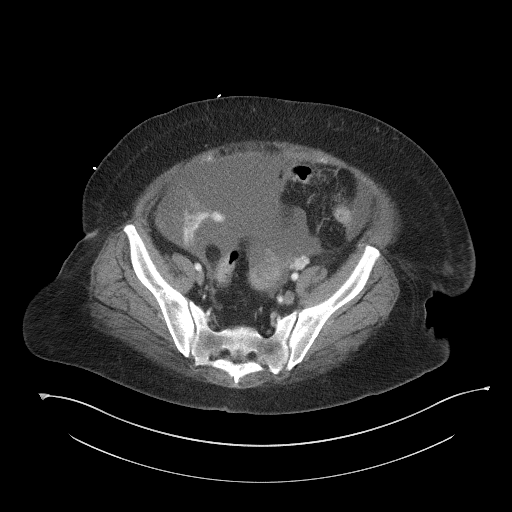
[im 46/107  soft-tissue]
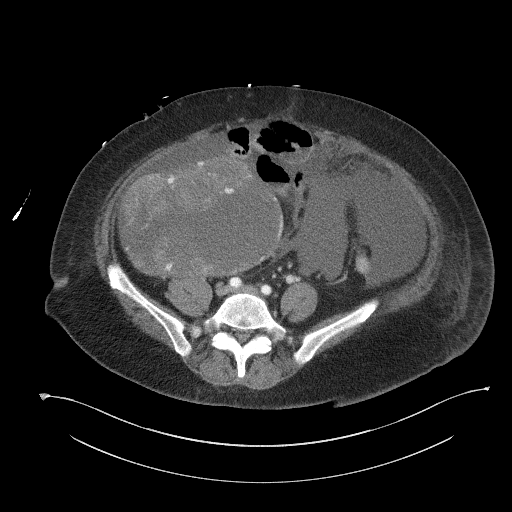
[im 56/107  soft-tissue]
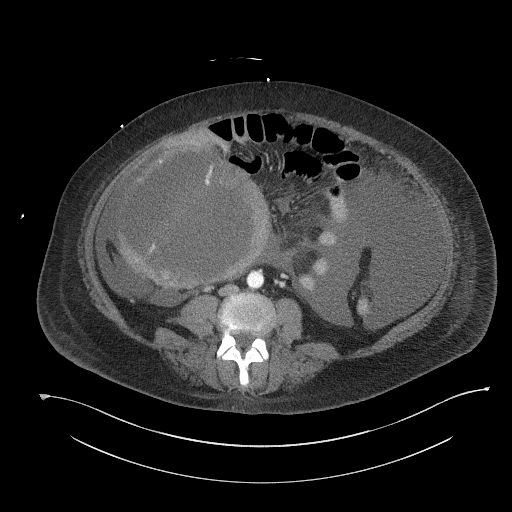
[im 61/107  soft-tissue]
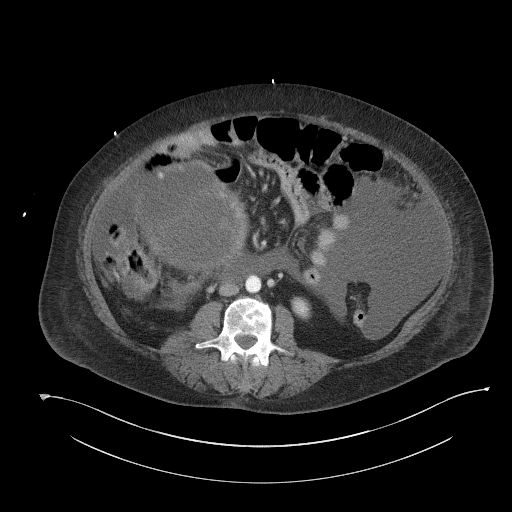
[im 71/107  soft-tissue]
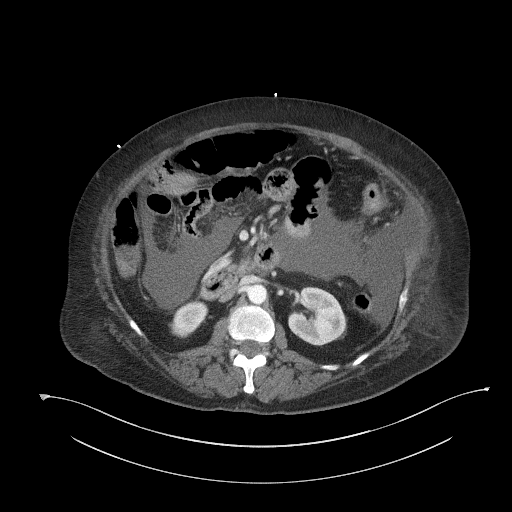
[im 71/107  bone]
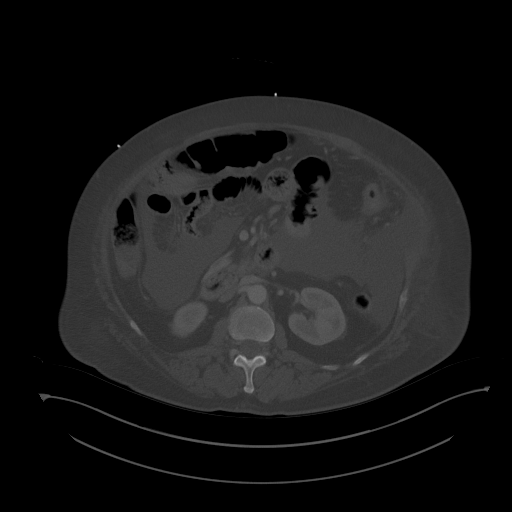
[im 76/107  soft-tissue]
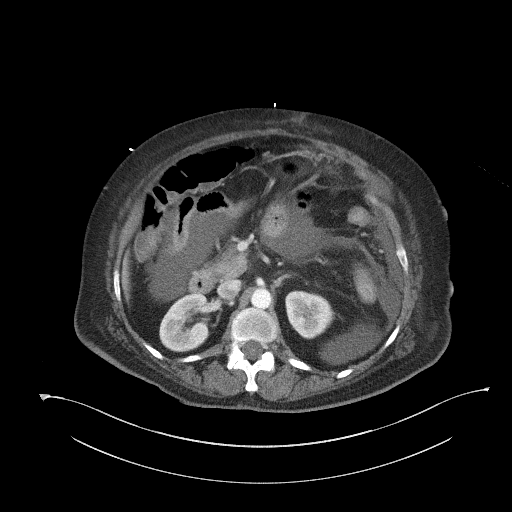
[im 86/107  soft-tissue]
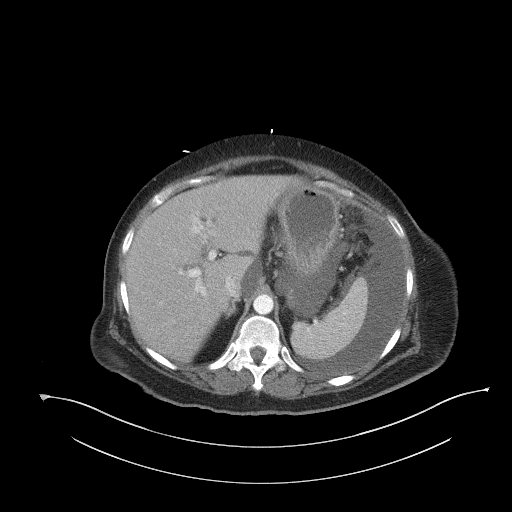
[im 91/107  soft-tissue]
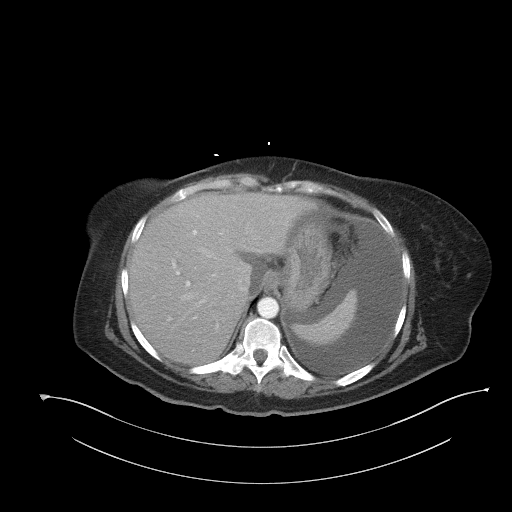
[im 101/107  soft-tissue]
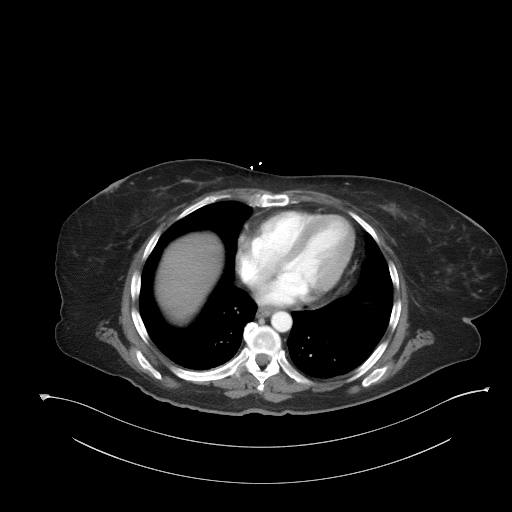

[Series 6: a/p w/ cor · coronal · 1.02mm/px · 3 of 183 slices shown]
[im 61/183  soft-tissue]
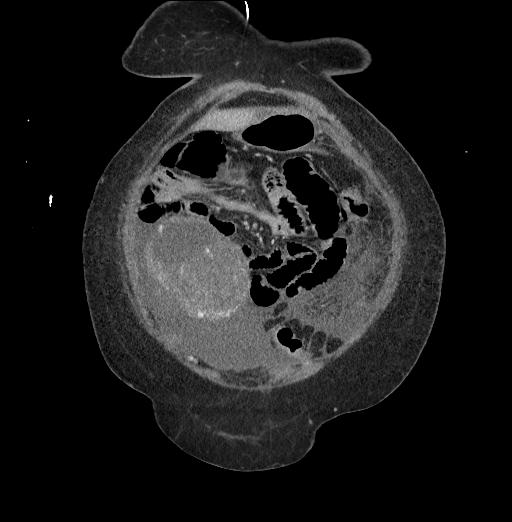
[im 81/183  soft-tissue]
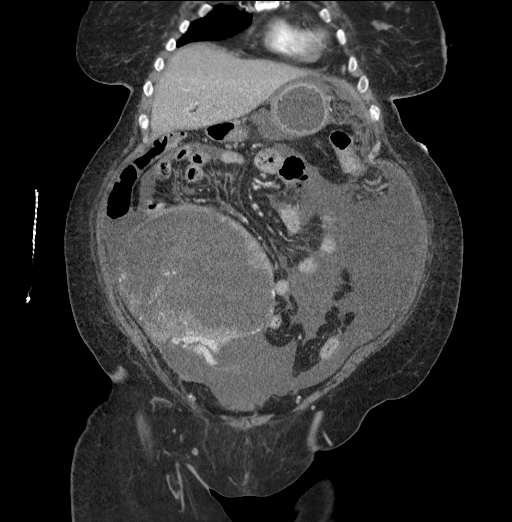
[im 102/183  soft-tissue]
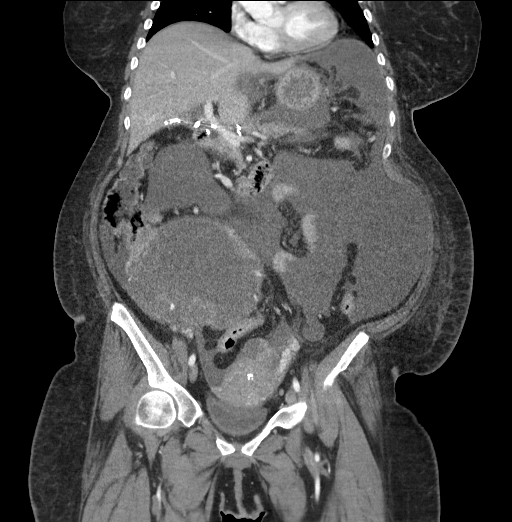

[16 of 46 positions shown; findings below may reference images not displayed]

FINDINGS: Lower chest: The visualized lung bases are clear.

No intra-abdominal free air.  Small ascites, new since the prior CT.

Hepatobiliary: Apparent fatty infiltration of the liver. There is
mild biliary ductal dilatation. Cholecystectomy.

Pancreas: Unremarkable. No pancreatic ductal dilatation or
surrounding inflammatory changes.

Spleen: Normal in size without focal abnormality.

Adrenals/Urinary Tract: The adrenal glands unremarkable. There is no
hydronephrosis on either side. The urinary bladder is grossly
unremarkable.

Stomach/Bowel: There is no bowel obstruction. The appendix is
unremarkable as visualized.

Vascular/Lymphatic: The abdominal aorta and IVC are unremarkable. No
portal venous gas.

Reproductive: The uterus is anteverted. Small uterine
calcifications, likely calcified fibroids.

There is a 4.5 x 4.5 cm mass posterior to the uterus, new since the
prior CT.

Additionally there is a large mass in the right lower abdomen
contiguous with the right adnexa measuring approximately 13 x 16 x
13 cm. This mass demonstrates heterogeneous enhancement with
predominantly lower areas of enhancement centrally. These masses are
most consistent with malignancy, possibly ovarian in origin. Further
characterization with MRI without and with contrast as well as
sampling of the ascitic fluid may provide additional diagnostic
value.

Other: None

Musculoskeletal: Degenerative changes versus Schmorl's node in the
superior endplate of the L3. No acute osseous pathology.
IMPRESSION: 1. Large right lower quadrant and pelvic masses most consistent with
malignancy, possibly ovarian in origin.
2. Small ascites, new since the prior CT.
3. No bowel obstruction. Normal appendix.
